# Patient Record
Sex: Male | Born: 1955 | Race: Black or African American | Hispanic: No | Smoking: Former smoker
Health system: Southern US, Community
[De-identification: ages and names within clinical notes are randomized; demographics above are authoritative.]

## PROBLEM LIST (undated history)

## (undated) DIAGNOSIS — H409 Unspecified glaucoma: Secondary | ICD-10-CM

## (undated) DIAGNOSIS — I10 Essential (primary) hypertension: Secondary | ICD-10-CM

## (undated) DIAGNOSIS — Z993 Dependence on wheelchair: Secondary | ICD-10-CM

## (undated) DIAGNOSIS — E119 Type 2 diabetes mellitus without complications: Secondary | ICD-10-CM

## (undated) DIAGNOSIS — R200 Anesthesia of skin: Secondary | ICD-10-CM

## (undated) DIAGNOSIS — Z972 Presence of dental prosthetic device (complete) (partial): Secondary | ICD-10-CM

## (undated) DIAGNOSIS — M1990 Unspecified osteoarthritis, unspecified site: Secondary | ICD-10-CM

## (undated) DIAGNOSIS — M109 Gout, unspecified: Secondary | ICD-10-CM

## (undated) DIAGNOSIS — E7800 Pure hypercholesterolemia, unspecified: Secondary | ICD-10-CM

## (undated) DIAGNOSIS — G629 Polyneuropathy, unspecified: Secondary | ICD-10-CM

## (undated) DIAGNOSIS — I517 Cardiomegaly: Secondary | ICD-10-CM

## (undated) DIAGNOSIS — R011 Cardiac murmur, unspecified: Secondary | ICD-10-CM

## (undated) DIAGNOSIS — I739 Peripheral vascular disease, unspecified: Secondary | ICD-10-CM

## (undated) DIAGNOSIS — H548 Legal blindness, as defined in USA: Secondary | ICD-10-CM

## (undated) DIAGNOSIS — H269 Unspecified cataract: Secondary | ICD-10-CM

## (undated) HISTORY — DX: Unspecified glaucoma: H40.9

## (undated) HISTORY — DX: Anesthesia of skin: R20.0

## (undated) HISTORY — PX: MULTIPLE TOOTH EXTRACTIONS: SHX2053

## (undated) HISTORY — DX: Cardiomegaly: I51.7

## (undated) HISTORY — PX: EYE SURGERY: SHX253

## (undated) HISTORY — PX: WISDOM TOOTH EXTRACTION: SHX21

## (undated) HISTORY — PX: COLONOSCOPY: SHX174

## (undated) HISTORY — DX: Polyneuropathy, unspecified: G62.9

---

## 2012-02-06 DIAGNOSIS — H401190 Primary open-angle glaucoma, unspecified eye, stage unspecified: Secondary | ICD-10-CM | POA: Insufficient documentation

## 2015-12-05 DIAGNOSIS — H1820 Unspecified corneal edema: Secondary | ICD-10-CM | POA: Insufficient documentation

## 2017-09-17 DIAGNOSIS — T868419 Corneal transplant failure, unspecified eye: Secondary | ICD-10-CM | POA: Insufficient documentation

## 2018-08-24 DIAGNOSIS — H2701 Aphakia, right eye: Secondary | ICD-10-CM | POA: Insufficient documentation

## 2019-01-04 ENCOUNTER — Other Ambulatory Visit: Payer: Self-pay | Admitting: *Deleted

## 2019-01-04 DIAGNOSIS — Z20822 Contact with and (suspected) exposure to covid-19: Secondary | ICD-10-CM

## 2019-01-08 LAB — NOVEL CORONAVIRUS, NAA: SARS-CoV-2, NAA: NOT DETECTED

## 2019-05-30 ENCOUNTER — Emergency Department (HOSPITAL_COMMUNITY): Payer: Medicare Other

## 2019-05-30 ENCOUNTER — Encounter: Payer: Self-pay | Admitting: Emergency Medicine

## 2019-05-30 ENCOUNTER — Emergency Department (HOSPITAL_COMMUNITY)
Admission: EM | Admit: 2019-05-30 | Discharge: 2019-05-31 | Discharge status: Against Medical Advice | Payer: Medicare Other | Attending: Emergency Medicine | Admitting: Emergency Medicine

## 2019-05-30 ENCOUNTER — Other Ambulatory Visit: Payer: Self-pay

## 2019-05-30 ENCOUNTER — Ambulatory Visit (INDEPENDENT_AMBULATORY_CARE_PROVIDER_SITE_OTHER)
Admission: EM | Admit: 2019-05-30 | Discharge: 2019-05-30 | Disposition: A | Discharge status: Home / Self Care | Payer: Medicare Other | Source: Home / Self Care

## 2019-05-30 ENCOUNTER — Encounter (HOSPITAL_COMMUNITY): Payer: Self-pay | Admitting: Emergency Medicine

## 2019-05-30 DIAGNOSIS — M7989 Other specified soft tissue disorders: Secondary | ICD-10-CM | POA: Insufficient documentation

## 2019-05-30 DIAGNOSIS — R269 Unspecified abnormalities of gait and mobility: Secondary | ICD-10-CM | POA: Diagnosis not present

## 2019-05-30 DIAGNOSIS — I10 Essential (primary) hypertension: Secondary | ICD-10-CM

## 2019-05-30 DIAGNOSIS — Z5321 Procedure and treatment not carried out due to patient leaving prior to being seen by health care provider: Secondary | ICD-10-CM | POA: Diagnosis not present

## 2019-05-30 DIAGNOSIS — R202 Paresthesia of skin: Secondary | ICD-10-CM | POA: Insufficient documentation

## 2019-05-30 DIAGNOSIS — R200 Anesthesia of skin: Secondary | ICD-10-CM | POA: Diagnosis not present

## 2019-05-30 HISTORY — DX: Type 2 diabetes mellitus without complications: E11.9

## 2019-05-30 HISTORY — DX: Pure hypercholesterolemia, unspecified: E78.00

## 2019-05-30 HISTORY — DX: Essential (primary) hypertension: I10

## 2019-05-30 LAB — URINALYSIS, ROUTINE W REFLEX MICROSCOPIC
Bacteria, UA: NONE SEEN
Bilirubin Urine: NEGATIVE
Glucose, UA: NEGATIVE mg/dL
Hgb urine dipstick: NEGATIVE
Ketones, ur: NEGATIVE mg/dL
Nitrite: NEGATIVE
Protein, ur: NEGATIVE mg/dL
Specific Gravity, Urine: 1.004 — ABNORMAL LOW (ref 1.005–1.030)
pH: 8 (ref 5.0–8.0)

## 2019-05-30 LAB — CBC
HCT: 47.2 % (ref 39.0–52.0)
Hemoglobin: 15.3 g/dL (ref 13.0–17.0)
MCH: 27 pg (ref 26.0–34.0)
MCHC: 32.4 g/dL (ref 30.0–36.0)
MCV: 83.2 fL (ref 80.0–100.0)
Platelets: 178 10*3/uL (ref 150–400)
RBC: 5.67 MIL/uL (ref 4.22–5.81)
RDW: 13.2 % (ref 11.5–15.5)
WBC: 6.4 10*3/uL (ref 4.0–10.5)
nRBC: 0 % (ref 0.0–0.2)

## 2019-05-30 LAB — I-STAT CHEM 8, ED
BUN: 23 mg/dL (ref 8–23)
Calcium, Ion: 1.13 mmol/L — ABNORMAL LOW (ref 1.15–1.40)
Chloride: 101 mmol/L (ref 98–111)
Creatinine, Ser: 0.8 mg/dL (ref 0.61–1.24)
Glucose, Bld: 85 mg/dL (ref 70–99)
HCT: 46 % (ref 39.0–52.0)
Hemoglobin: 15.6 g/dL (ref 13.0–17.0)
Potassium: 3.5 mmol/L (ref 3.5–5.1)
Sodium: 141 mmol/L (ref 135–145)
TCO2: 27 mmol/L (ref 22–32)

## 2019-05-30 LAB — COMPREHENSIVE METABOLIC PANEL
ALT: 18 U/L (ref 0–44)
AST: 21 U/L (ref 15–41)
Albumin: 4.3 g/dL (ref 3.5–5.0)
Alkaline Phosphatase: 63 U/L (ref 38–126)
Anion gap: 15 (ref 5–15)
BUN: 19 mg/dL (ref 8–23)
CO2: 24 mmol/L (ref 22–32)
Calcium: 10 mg/dL (ref 8.9–10.3)
Chloride: 100 mmol/L (ref 98–111)
Creatinine, Ser: 0.77 mg/dL (ref 0.61–1.24)
GFR calc Af Amer: >60 mL/min (ref >60)
GFR calc non Af Amer: >60 mL/min (ref >60)
Glucose, Bld: 85 mg/dL (ref 70–99)
Potassium: 3.5 mmol/L (ref 3.5–5.1)
Sodium: 139 mmol/L (ref 135–145)
Total Bilirubin: 0.7 mg/dL (ref 0.3–1.2)
Total Protein: 8.2 g/dL — ABNORMAL HIGH (ref 6.5–8.1)

## 2019-05-30 LAB — RAPID URINE DRUG SCREEN, HOSP PERFORMED
Amphetamines: NOT DETECTED
Barbiturates: NOT DETECTED
Benzodiazepines: NOT DETECTED
Cocaine: NOT DETECTED
Opiates: NOT DETECTED
Tetrahydrocannabinol: NOT DETECTED

## 2019-05-30 LAB — DIFFERENTIAL
Abs Immature Granulocytes: 0.01 10*3/uL (ref 0.00–0.07)
Basophils Absolute: 0.1 10*3/uL (ref 0.0–0.1)
Basophils Relative: 1 %
Eosinophils Absolute: 0.4 10*3/uL (ref 0.0–0.5)
Eosinophils Relative: 6 %
Immature Granulocytes: 0 %
Lymphocytes Relative: 35 %
Lymphs Abs: 2.3 10*3/uL (ref 0.7–4.0)
Monocytes Absolute: 0.5 10*3/uL (ref 0.1–1.0)
Monocytes Relative: 8 %
Neutro Abs: 3.2 10*3/uL (ref 1.7–7.7)
Neutrophils Relative %: 50 %

## 2019-05-30 LAB — PROTIME-INR
INR: 1.1 (ref 0.8–1.2)
Prothrombin Time: 13.9 seconds (ref 11.4–15.2)

## 2019-05-30 LAB — APTT: aPTT: 30 seconds (ref 24–36)

## 2019-05-30 LAB — CBG MONITORING, ED: Glucose-Capillary: 93 mg/dL (ref 70–99)

## 2019-05-30 LAB — ETHANOL: Alcohol, Ethyl (B): <10 mg/dL (ref <10)

## 2019-05-30 NOTE — ED Triage Notes (Signed)
Patient here with bilateral hand numbness that has been going on for 2 weeks.  He has new numbness in feet that started this afternoon.  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  Unknown if that is new for him.  Patient has equal hand grips.

## 2019-05-30 NOTE — ED Provider Notes (Signed)
MSE was initiated and I personally evaluated the patient and placed orders (if any) at  7:50 PM on May 30, 2019.  The patient appears stable so that the remainder of the MSE may be completed by another provider.  Brian Daniels is a 64 y.o. male here with numbness, weakness. Patient has numbness of bilateral hands for the last 2 weeks. He told triage that he had leg numbness today. But he told me that he has bilateral leg numbness for a week and urgent care noted 2 days of leg numbness and swelling. No trouble speaking. 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 Has dec sensation bilateral arms and legs. Nl strength bilateral upper and lower extremities. Nl finger to nose bilaterally. Consider parethesias from diabetes vs stroke. He is outside window for TPA and doesn't qualify for LVO. Ordered stroke order set and CT head. No code stroke currently     Drenda Freeze, MD 05/30/19 [phone]

## 2019-05-30 NOTE — ED Notes (Signed)
Patient able to ambulate independently  

## 2019-05-30 NOTE — ED Triage Notes (Addendum)
Pt presents to [facility] for assessment of 2 days of bilateral hand numbness.  Sister states 2 days ago she noted bilateral lower limb swelling.  Sister states she came back today at 70 and patient is unable to get up out of the chair on his own, Sister having to lift him and support him when walking into the clinic. aPP at bedside with patient at this time.

## 2019-05-30 NOTE — ED Notes (Signed)
APP explained further evaluation needed in the ER

## 2019-05-30 NOTE — ED Provider Notes (Signed)
[facility]    CSN: [id number] Arrival date & time: 05/30/19  6295      History   Chief Complaint Chief Complaint  Patient presents with  . Foot Swelling    HPI Johncarlos Holtsclaw is a 63 y.o. male with history of hypertension, hyperlipidemia, diabetes presenting with his sister for inability to get up out of his chair, ambulate independently since 17:15 this evening.  Episode was preceded by 2-day course of bilateral hand distal lower extremity numbness, 2-week course of bilateral hand numbness.  Patient denies history of diabetic neuropathy, vascular disease.  Sister helps provide history: Denies recent change in medications, fall, head trauma or loss of consciousness.  Sister largely concerned that patient has not been able to get up since this evening.  No facial droop, slurred speech, increased irritation/aggravation.   Past Medical History:  Diagnosis Date  . Diabetes mellitus without complication ([place])   . Hypercholesteremia   . Hypertension     There are no active problems to display for this patient.   No past surgical history on file.     Home Medications    Prior to Admission medications   Not on File    Family History No family history on file.  Social History Social History   Tobacco Use  . Smoking status: Not on file  Substance Use Topics  . Alcohol use: Not on file  . Drug use: Not on file     Allergies   Patient has no known allergies.   Review of Systems Review of Systems  Constitutional: Negative for fatigue and fever.  Respiratory: Negative for cough and shortness of breath.   Cardiovascular: Negative for chest pain and palpitations.  Gastrointestinal: Negative for abdominal pain, diarrhea and vomiting.  Musculoskeletal: Positive for gait problem. Negative for arthralgias and myalgias.  Skin: Negative for rash and wound.  Neurological: Positive for weakness and numbness. Negative for dizziness, facial asymmetry, speech  difficulty, light-headedness and headaches.  All other systems reviewed and are negative.    Physical Exam Triage Vital Signs ED Triage Vitals  Enc Vitals Group     BP 05/30/19 1837 (!) 186/101     Pulse Rate 05/30/19 1837 63     Resp 05/30/19 1837 18     Temp 05/30/19 1837 98.2 F (36.8 C)     Temp src --      SpO2 05/30/19 1837 97 %     Weight --      Height --      Head Circumference --      Peak Flow --      Pain Score 05/30/19 1838 0     Pain Loc --      Pain Edu? --      Excl. in [place]? --    No data found.  Updated Vital Signs BP (!) 173/90 (BP Location: Right Arm)   Pulse 63   Temp 98.2 F (36.8 C)   Resp 18   SpO2 97%   Visual Acuity Right Eye Distance:   Left Eye Distance:   Bilateral Distance:    Right Eye Near:   Left Eye Near:    Bilateral Near:     Physical Exam Constitutional:      General: He is not in acute distress.    Appearance: He is normal weight. He is not ill-appearing.  HENT:     Head: Normocephalic and atraumatic.     Mouth/Throat:     Pharynx: Oropharynx is clear.  Eyes:     General: No scleral icterus.    Conjunctiva/sclera: Conjunctivae normal.     Comments: Right corneal implant with slight lid droop that is chronic/stable per sister  Neck:     Musculoskeletal: Neck supple. No muscular tenderness.  Cardiovascular:     Rate and Rhythm: Normal rate and regular rhythm.  Pulmonary:     Effort: Pulmonary effort is normal. No respiratory distress.     Breath sounds: No wheezing.  Lymphadenopathy:     Cervical: No cervical adenopathy.  Skin:    Capillary Refill: Capillary refill takes less than 2 seconds.     Coloration: Skin is not jaundiced or pale.  Neurological:     Mental Status: He is alert and oriented to person, place, and time.     Cranial Nerves: No cranial nerve deficit.     Sensory: Sensory deficit present.     Motor: Weakness present.     Coordination: Coordination normal.     Gait: Gait abnormal.     Deep  Tendon Reflexes: Reflexes normal.     Comments: Exam limited second to patient's compliance due to reported inability to get out of office wheelchair      UC Treatments / Results  Labs (all labs ordered are listed, but only abnormal results are displayed) Labs Reviewed - No data to display  EKG   Radiology No results found.  Procedures Procedures (including critical care time)  Medications Ordered in UC Medications - No data to display  Initial Impression / Assessment and Plan / UC Course  I have reviewed the triage vital signs and the nursing notes.  Pertinent labs & imaging results that were available during my care of the patient were reviewed by me and considered in my medical decision making (see chart for details).     Given patient's age, risk factors, and sudden change in ability to stand up, ambulate independently x1 hour, patient's sister feels more comfortable with patient going to ER for further evaluation.  Bilateral upper and lower distal extremity symptoms likely vascular or diabetic neuropathy related, though inability to ambulate is more concerning, no focal neuro findings suggestive of acute stroke.  Patient hemodynamically stable, in no acute distress, sister friend to transport as opposed to EMS.  Return precautions discussed, patient verbalized understanding and is agreeable to plan. Final Clinical Impressions(s) / UC Diagnoses   Final diagnoses:  Abnormal gait  Numbness of foot   Discharge Instructions   None    ED Prescriptions    None     PDMP not reviewed this encounter.   Odette Fraction [place], [place] 06/02/19 [phone]

## 2019-06-08 ENCOUNTER — Encounter: Payer: Self-pay | Admitting: *Deleted

## 2019-06-13 ENCOUNTER — Encounter: Payer: Self-pay | Admitting: Neurology

## 2019-06-13 ENCOUNTER — Other Ambulatory Visit: Payer: Self-pay

## 2019-06-13 ENCOUNTER — Ambulatory Visit: Payer: Medicare Other | Admitting: Neurology

## 2019-06-13 VITALS — BP 162/94 | HR 72 | Temp 97.8°F

## 2019-06-13 DIAGNOSIS — R531 Weakness: Secondary | ICD-10-CM

## 2019-06-13 DIAGNOSIS — M545 Low back pain, unspecified: Secondary | ICD-10-CM | POA: Insufficient documentation

## 2019-06-13 DIAGNOSIS — R269 Unspecified abnormalities of gait and mobility: Secondary | ICD-10-CM | POA: Diagnosis not present

## 2019-06-13 DIAGNOSIS — R202 Paresthesia of skin: Secondary | ICD-10-CM | POA: Diagnosis not present

## 2019-06-13 DIAGNOSIS — R3915 Urgency of urination: Secondary | ICD-10-CM

## 2019-06-13 DIAGNOSIS — G3281 Cerebellar ataxia in diseases classified elsewhere: Secondary | ICD-10-CM

## 2019-06-13 NOTE — Progress Notes (Signed)
PATIENT: Brian Daniels DOB: [birth year]  Chief Complaint  Patient presents with  . Numbness    He is here with his sister, [name].  Reports numbness in his hands, legs and feet.  Symptoms have worsened over the last three months.  Over the last three weeks, he has been using a wheelchair due to not being able to stand without assistance.  He was recently started on gabapentin 300mg , one capsule TID.  PCP    [name] Kitchen, MD     HISTORICAL  Seydina Holliman is a 63 year old male, seen in request by his primary care physician Dr. 77 for evaluation of gait abnormality, numbness, he is accompanied by his sister Knox Royalty at today's visit on June 13, 2019.  I have reviewed and summarized the referring note from the referring physician.  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  In August 2020, he began to noticed left hand numbness, initially only involving fingertips, over couple weeks travel to left elbow level, then developed right hand numbness, began to notice gait abnormality since November 2020, also has worsening constipation, urinary urgency, occasionally urinary accident,  He also complains of neck pain, radiating pain to bilateral shoulder, low back pain, left arm, and leg is weaker than the right side  He presented to emergency room on May 30, 2019, I personally reviewed CT scans,  CT head showed prominent of ventricle out of proportion to the sulcal dilatation, suggestive of normal pressure hydrocephalus  Cervical spine showed multilevel degenerative changes, anterior wedging of the C5-6-7, at least moderate canal stenosis at C4-5, C5-6, C6-7.  Laboratory evaluation showed normal CMP with exception of slightly decreased iron calcium 1.13, normal CBC, with hemoglobin of  15.3, INR 1.1, negative urine drug screen,   REVIEW OF SYSTEMS: Full 14 system review of systems performed and notable only for as above All other review of systems were negative.  ALLERGIES: No Known Allergies  HOME MEDICATIONS: Current Outpatient Medications  Medication Sig Dispense Refill  . amLODipine (NORVASC) 5 MG tablet Take 5 mg by mouth daily.    . Ascorbic Acid (VITAMIN C PO) Take 1 tablet by mouth daily.    June 01, 2019 aspirin EC 81 MG tablet Take 1 tablet by mouth daily.    [name] Kitchen atorvastatin (LIPITOR) 20 MG tablet Take 20 mg by mouth daily.    . benazepril (LOTENSIN) 10 MG tablet Take 10 mg by mouth daily.    . BL CALCIUM-MAGNESIUM-ZINC PO Take 1 tablet by mouth daily.    . brimonidine (ALPHAGAN) 0.2 % ophthalmic solution Place 1 drop into both eyes 3 (three) times daily.    . Cyanocobalamin (B-12 PO) Take 1 tablet by mouth daily.    . ferrous sulfate 324 MG TBEC Take 324 mg by mouth 2 (two) times daily.    [name] Kitchen gabapentin (NEURONTIN) 300 MG capsule Take 300 mg by mouth 3 (three) times daily.    [name] Kitchen GINSENG PO Take 1 tablet by mouth daily.    . indomethacin (INDOCIN) 50 MG capsule Take 50 mg by mouth 3 (three) times daily as needed.    . metFORMIN (GLUCOPHAGE-XR) 500 MG 24 hr tablet Take 1 tablet by mouth daily.    . metoprolol succinate (TOPROL-XL) 25 MG 24 hr tablet Take 25 mg by mouth daily.    [name] Kitchen  Omega-3 Fatty Acids (FISH OIL PO) Take 1 tablet by mouth daily.    . prednisoLONE acetate (PRED FORTE) 1 % ophthalmic suspension Place 1 drop into the right eye 2 (two) times daily.    . timolol (BETIMOL) 0.5 % ophthalmic solution Place 1 drop into both eyes 2 (two) times daily.    [name] Kitchen VITAMIN D PO Take 1 tablet by mouth daily.    [name] Kitchen VITAMIN E PO Take 1 tablet by mouth daily.     No current facility-administered medications for this visit.     PAST MEDICAL HISTORY: Past Medical History:  Diagnosis Date  . Diabetes mellitus without complication ([place])   . Glaucoma   . Hypercholesteremia   .  Hypertension   . Left atrial enlargement   . Neuropathy   . Numbness     PAST SURGICAL HISTORY: Past Surgical History:  Procedure Laterality Date  . EYE SURGERY Bilateral     FAMILY HISTORY: Family History  Problem Relation Age of Onset  . Hyperlipidemia Mother   . Diabetes Mother   . Hypertension Father     SOCIAL HISTORY: Social History   Socioeconomic History  . Marital status: Single    Spouse name: Not on file  . Number of children: 0  . Years of education: 49  . Highest education level: High school graduate  Occupational History  . Occupation: Retired  Scientific laboratory technician  . Financial resource strain: Not on file  . Food insecurity    Worry: Not on file    Inability: Not on file  . Transportation needs    Medical: Not on file    Non-medical: Not on file  Tobacco Use  . Smoking status: Never Smoker  . Smokeless tobacco: Never Used  Substance and Sexual Activity  . Alcohol use: Not Currently  . Drug use: Never  . Sexual activity: Not on file  Lifestyle  . Physical activity    Days per week: Not on file    Minutes per session: Not on file  . Stress: Not on file  Relationships  . Social Herbalist on phone: Not on file    Gets together: Not on file    Attends religious service: Not on file    Active member of club or organization: Not on file    Attends meetings of clubs or organizations: Not on file    Relationship status: Not on file  . Intimate partner violence    Fear of current or ex partner: Not on file    Emotionally abused: Not on file    Physically abused: Not on file    Forced sexual activity: Not on file  Other Topics Concern  . Not on file  Social History Narrative   Lives with his sister.   Left-handed.   No daily caffeine use.  Occasional tea.     PHYSICAL EXAM   Vitals:   06/13/19 1359  BP: (!) 162/94  Pulse: 72  Temp: 97.8 F (36.6 C)    Not recorded      There is no height or weight on file to calculate BMI.   PHYSICAL EXAMNIATION:  Gen: NAD, conversant, well nourised, well groomed                     Cardiovascular: Regular rate rhythm, no peripheral edema, warm, nontender. Eyes: Conjunctivae clear without exudates or hemorrhage Neck: Supple, no carotid bruits. Pulmonary: Clear to auscultation bilaterally   NEUROLOGICAL EXAM:  MENTAL STATUS: Speech:    Speech is normal; fluent and spontaneous with normal comprehension.  Cognition:     Orientation to time, place and person     Normal recent and remote memory     Normal Attention span and concentration     Normal Language, naming, repeating,spontaneous speech     Fund of knowledge   CRANIAL NERVES: CN II: Visual fields are full to confrontation.  Pupils are round equal and briskly reactive to light. CN III, IV, VI: extraocular movement are normal. No ptosis. CN V: Facial sensation is intact to pinprick in all 3 divisions bilaterally. Corneal responses are intact.  CN VII: Face is symmetric with normal eye closure and smile. CN VIII: Hearing is normal to causal conversation. CN IX, X: Palate elevates symmetrically. Phonation is normal. CN XI: Head turning and shoulder shrug are intact CN XII: Tongue is midline with normal movements and no atrophy.  MOTOR: Normal tone of bilateral upper extremity, mild to moderate bilateral shoulder abduction, elbow flexion weakness, mild bilateral elbow extension weakness, mild bilateral wrist flexion extension weakness, grip weakness, left worse than right, fixation of left upper extremity on rapid rotating movement  Motor strength of bilateral lower extremity (R/L), hip flexion 4/2 knee extension 5/4, knee flexion 5/3, ankle dorsiflexion 4/3, ankle plantarflexion 4/3  REFLEXES: Reflexes are 2+ and symmetric at the biceps, triceps, knees, and absent at ankles. Plantar responses are extensor bilaterally  SENSORY: Decreased vibratory sensation at fingertips, and toes, length dependent decreased to  pinprick to elbow level, and knee level  COORDINATION: There is no trunk or limb dysmetria noted.  GAIT/STANCE: Deferred   DIAGNOSTIC DATA (LABS, IMAGING, TESTING) - I reviewed patient records, labs, notes, testing and imaging myself where available.   ASSESSMENT AND PLAN  Francesco RunnerGlenn Ohnemus is a 63 y.o. male   Subacute onset progressive weakness, involving bilateral upper and lower extremity, gait abnormality  Length dependent sensory changes, bilateral upper and lower extremity weakness, left worse than right, hyperreflexia, bilateral Babinski signs  CT head showed enlarged ventricle, suggestive of normal hydrocephalus, cervical spine showed significant degenerative disease, moderate canal stenosis at C4-5, 5 6, C6-7 level  Proceed with MRI of cervical, brain, also need to rule out lumbar stenosis, MRI of lumbar spine   Levert FeinsteinYijun Keymon Mcelroy, M.D. Ph.D.  [facility] [address], [address] [place], [place] [address] Ph: [phone]) [phone] Fax: [phone]  CC: Knox RoyaltyJones, Enrico, MD

## 2019-06-15 ENCOUNTER — Telehealth: Payer: Self-pay | Admitting: Neurology

## 2019-06-15 DIAGNOSIS — R269 Unspecified abnormalities of gait and mobility: Secondary | ICD-10-CM

## 2019-06-15 DIAGNOSIS — R531 Weakness: Secondary | ICD-10-CM

## 2019-06-15 DIAGNOSIS — M545 Low back pain, unspecified: Secondary | ICD-10-CM

## 2019-06-15 DIAGNOSIS — R202 Paresthesia of skin: Secondary | ICD-10-CM

## 2019-06-15 NOTE — Telephone Encounter (Signed)
Per Dr. Krista Blue, okay to provide rx for wheelchair.  Signed and placed up front for his sister to pick up.  Also, she would like his MRI scans to be completed urgently - by the end of this week.

## 2019-06-15 NOTE — Telephone Encounter (Signed)
Patient sister called to request a wheelchair prescription/order due to the patient not being able to walk. Sister states the wheelchair the patient was in at the time of appt was borrowed however in order for insurance coverage a prescription would be needed.   Patient sister also had a couple of questions in regards to the MRI that is needing to be scheduled and states the patient is getting worst by the day and is unsure if he should go to the ED.  Please follow up.

## 2019-06-16 ENCOUNTER — Ambulatory Visit: Payer: Medicare Other | Admitting: Neurology

## 2019-06-16 NOTE — Telephone Encounter (Signed)
Their first available is next Wednesday 12/09. I will make them aware they need to do the Cervical that day.

## 2019-06-16 NOTE — Telephone Encounter (Signed)
Do MRI cervical urgently, MRI of brain and Lumbar can be done later.

## 2019-06-16 NOTE — Telephone Encounter (Signed)
I called GI they have changed the CERVICAL to the first available day of 12/09 and will complete the other two images on 12/14. DW

## 2019-06-21 NOTE — Telephone Encounter (Signed)
error 

## 2019-06-23 ENCOUNTER — Telehealth: Payer: Self-pay | Admitting: Neurology

## 2019-06-23 ENCOUNTER — Ambulatory Visit
Admission: RE | Admit: 2019-06-23 | Discharge: 2019-06-23 | Disposition: A | Discharge status: Home / Self Care | Payer: Medicare Other | Source: Ambulatory Visit | Attending: Neurology | Admitting: Neurology

## 2019-06-23 ENCOUNTER — Other Ambulatory Visit: Payer: Self-pay

## 2019-06-23 ENCOUNTER — Other Ambulatory Visit: Payer: Medicare Other

## 2019-06-23 DIAGNOSIS — G3281 Cerebellar ataxia in diseases classified elsewhere: Secondary | ICD-10-CM

## 2019-06-23 NOTE — Telephone Encounter (Signed)
I have done peer to peer review.  MRI brain MRI cervical MRI lumbar  [phone],

## 2019-06-23 NOTE — Telephone Encounter (Signed)
Noted, thank you. DW  °

## 2019-06-23 NOTE — Telephone Encounter (Signed)
Dr. Krista Blue Please call for peer to peer today . Patient is scheduled at 5:00 pm today at [facility] .  Please call [phone] option [phone]  Insurance ID # [id number].  MRI Lumber spine  wo  - [id number]  MR Cervical Spine wo -[id number]  MR Brain wo contrast [phone]

## 2019-06-27 ENCOUNTER — Other Ambulatory Visit: Payer: Self-pay

## 2019-06-27 ENCOUNTER — Ambulatory Visit
Admission: RE | Admit: 2019-06-27 | Discharge: 2019-06-27 | Disposition: A | Discharge status: Home / Self Care | Payer: Medicare Other | Source: Ambulatory Visit | Attending: Neurology | Admitting: Neurology

## 2019-06-27 ENCOUNTER — Other Ambulatory Visit: Payer: Medicare Other

## 2019-06-27 ENCOUNTER — Telehealth: Payer: Self-pay | Admitting: Neurology

## 2019-06-27 DIAGNOSIS — G959 Disease of spinal cord, unspecified: Secondary | ICD-10-CM

## 2019-06-27 DIAGNOSIS — G3281 Cerebellar ataxia in diseases classified elsewhere: Secondary | ICD-10-CM

## 2019-06-27 DIAGNOSIS — R269 Unspecified abnormalities of gait and mobility: Secondary | ICD-10-CM

## 2019-06-27 NOTE — Telephone Encounter (Signed)
Please call patient, MRI of cervical spine showed severe spinal stenosis at C3-4, with evidence of spinal cord signal changes.  Multilevel degenerative changes  Let him keep his scheduled MRI, and a follow-up appointment, also referred him to neurosurgical clinic urgently.   IMPRESSION: This is an abnormal MRI of the cervical spine showing the following: 1.    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   2.    There is also mild spinal stenosis at C4-C5, moderate spinal stenosis at C5-6, moderate spinal stenosis at C6-7 and mild spinal stenosis at C7-T1 due to combination of degenerative changes. 3.    Various degrees of foraminal stenosis at every level.  It could be nerve root compression at C2-C3 affecting the left C3 nerve root, at C3-C4 affecting the right C4 nerve root, C4-C5 affecting the right C5 nerve root and C7-T1 affecting either C8 nerve root.

## 2019-06-27 NOTE — Telephone Encounter (Signed)
I spoke to the patient and his sister.  They verbalized understanding of his cervical MRI results.  She has already talked with the neurosurgeon office and he has an appt on 07/04/2019.  He is scheduled to have the MRI scans of his brain and lumbar today.  He will keep his follow up with Dr. Krista Blue on 06/30/2019.

## 2019-06-27 NOTE — Telephone Encounter (Signed)
Referral sent 

## 2019-06-30 ENCOUNTER — Other Ambulatory Visit: Payer: Self-pay

## 2019-06-30 ENCOUNTER — Ambulatory Visit: Payer: Medicare Other | Admitting: Neurology

## 2019-06-30 ENCOUNTER — Encounter: Payer: Self-pay | Admitting: Neurology

## 2019-06-30 VITALS — BP 161/91 | HR 88 | Temp 97.7°F

## 2019-06-30 DIAGNOSIS — G959 Disease of spinal cord, unspecified: Secondary | ICD-10-CM | POA: Diagnosis not present

## 2019-06-30 DIAGNOSIS — R202 Paresthesia of skin: Secondary | ICD-10-CM

## 2019-06-30 DIAGNOSIS — R269 Unspecified abnormalities of gait and mobility: Secondary | ICD-10-CM

## 2019-06-30 DIAGNOSIS — R531 Weakness: Secondary | ICD-10-CM | POA: Diagnosis not present

## 2019-06-30 NOTE — Progress Notes (Addendum)
PATIENT: Brian Daniels Jurczyk DOB: 1956-01-18  Chief Complaint  Patient presents with  . Gait Abnormality    He is here with his sister, [name].  They would like to review the results of his brain, cervical and lumbar MRI scans.  He has a pending appt at [facility] on 07/04/2019.     HISTORICAL  Brian Daniels Junkins is a 63 year old male, seen in request by his primary care physician Dr. Knox RoyaltyJones, [place] for evaluation of gait abnormality, numbness, he is accompanied by his sister Jake Samplesthena at today's visit on June 13, 2019.  I have reviewed and summarized the referring note from the referring physician.  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  In August 2020, he began to noticed left hand numbness, initially only involving fingertips, over couple weeks travel to left elbow level, then developed right hand numbness, began to notice gait abnormality since November 2020, also has worsening constipation, urinary urgency, occasionally urinary accident,  He also complains of neck pain, radiating pain to bilateral shoulder, low back pain, left arm, and leg is weaker than the right side  He presented to emergency room on May 30, 2019, I personally reviewed CT scans,  CT head showed prominent of ventricle out of proportion to the sulcal dilatation, suggestive of normal pressure hydrocephalus  Cervical spine showed multilevel degenerative changes, anterior wedging of the C5-6-7, at least moderate canal stenosis at C4-5, C5-6, C6-7.  Laboratory evaluation showed normal CMP with exception of slightly decreased iron calcium 1.13, normal CBC, with hemoglobin of 15.3, INR 1.1, negative urine drug screen,  UPDATE Jun 30 2019: He is accompanied by his sister at today's visit, complains of progressive gait  abnormality, upper extremity weakness, left worse than right  We have personally reviewed MRIs in December 2020  MRI of lumbar, multilevel degenerative changes, most severe at 3&4,\spelled with moderate to severe spinal stenosis, severe right, moderate left foraminal narrowing  MRI of brain, moderate perisylvian atrophy, moderate ventriculomegaly  MRI of cervical spine, abnormal signal within the spinal cord adjacent to C3-4, consistent with compressive myelopathy, associated with severe spinal stenosis at this level, multilevel degenerative changes, variable degree of foraminal narrowing  REVIEW OF SYSTEMS: Full 14 system review of systems performed and notable only for as above All other review of systems were negative.  ALLERGIES: No Known Allergies  HOME MEDICATIONS: Current Outpatient Medications  Medication Sig Dispense Refill  . amLODipine (NORVASC) 5 MG tablet Take 5 mg by mouth daily.    . Ascorbic Acid (VITAMIN C PO) Take 1 tablet by mouth daily.    [name] Kitchen. aspirin EC 81 MG tablet Take 1 tablet by mouth daily.    [name] Kitchen. atorvastatin (LIPITOR) 20 MG tablet Take 20 mg by mouth daily.    . benazepril (LOTENSIN) 10 MG tablet Take 10 mg by mouth daily.    . BL CALCIUM-MAGNESIUM-ZINC PO Take 1 tablet by mouth daily.    . brimonidine (ALPHAGAN) 0.2 % ophthalmic solution Place 1 drop into both eyes 3 (three) times daily.    . Cyanocobalamin (B-12 PO) Take 1 tablet by mouth daily.    . ferrous sulfate 324 MG TBEC Take 324 mg by mouth 2 (two) times daily.    [name] Kitchen. gabapentin (NEURONTIN) 300 MG capsule Take 300 mg by mouth 3 (three) times daily.    .[name] Kitchen  GINSENG PO Take 1 tablet by mouth daily.    . indomethacin (INDOCIN) 50 MG capsule Take 50 mg by mouth 3 (three) times daily as needed.    . metFORMIN (GLUCOPHAGE-XR) 500 MG 24 hr tablet Take 1 tablet by mouth daily.    . metoprolol succinate (TOPROL-XL) 25 MG 24 hr tablet Take 25 mg by mouth daily.    . Omega-3 Fatty Acids (FISH OIL PO) Take 1 tablet  by mouth daily.    . prednisoLONE acetate (PRED FORTE) 1 % ophthalmic suspension Place 1 drop into the right eye 2 (two) times daily.    . timolol (BETIMOL) 0.5 % ophthalmic solution Place 1 drop into both eyes 2 (two) times daily.    [name] Kitchen VITAMIN D PO Take 1 tablet by mouth daily.    [name] Kitchen VITAMIN E PO Take 1 tablet by mouth daily.     No current facility-administered medications for this visit.    PAST MEDICAL HISTORY: Past Medical History:  Diagnosis Date  . Diabetes mellitus without complication (HCC)   . Glaucoma   . Hypercholesteremia   . Hypertension   . Left atrial enlargement   . Neuropathy   . Numbness     PAST SURGICAL HISTORY: Past Surgical History:  Procedure Laterality Date  . EYE SURGERY Bilateral     FAMILY HISTORY: Family History  Problem Relation Age of Onset  . Hyperlipidemia Mother   . Diabetes Mother   . Hypertension Father     SOCIAL HISTORY: Social History   Socioeconomic History  . Marital status: Single    Spouse name: Not on file  . Number of children: 0  . Years of education: 87  . Highest education level: High school graduate  Occupational History  . Occupation: Retired  Tobacco Use  . Smoking status: Never Smoker  . Smokeless tobacco: Never Used  Substance and Sexual Activity  . Alcohol use: Not Currently  . Drug use: Never  . Sexual activity: Not on file  Other Topics Concern  . Not on file  Social History Narrative   Lives with his sister.   Left-handed.   No daily caffeine use.  Occasional tea.   Social Determinants of Health   Financial Resource Strain:   . Difficulty of Paying Living Expenses: Not on file  Food Insecurity:   . Worried About Programme researcher, broadcasting/film/video in the Last Year: Not on file  . Ran Out of Food in the Last Year: Not on file  Transportation Needs:   . Lack of Transportation (Medical): Not on file  . Lack of Transportation (Non-Medical): Not on file  Physical Activity:   . Days of Exercise per Week: Not on  file  . Minutes of Exercise per Session: Not on file  Stress:   . Feeling of Stress : Not on file  Social Connections:   . Frequency of Communication with Friends and Family: Not on file  . Frequency of Social Gatherings with Friends and Family: Not on file  . Attends Religious Services: Not on file  . Active Member of Clubs or Organizations: Not on file  . Attends Banker Meetings: Not on file  . Marital Status: Not on file  Intimate Partner Violence:   . Fear of Current or Ex-Partner: Not on file  . Emotionally Abused: Not on file  . Physically Abused: Not on file  . Sexually Abused: Not on file     PHYSICAL EXAM   Vitals:   06/30/19 1319  BP: (!) 161/91  Pulse: 88  Temp: 97.7 F (36.5 C)    Not recorded      There is no height or weight on file to calculate BMI.  PHYSICAL EXAMNIATION:  Gen: NAD, conversant, well nourised, well groomed                     Cardiovascular: Regular rate rhythm, no peripheral edema, warm, nontender. Eyes: Conjunctivae clear without exudates or hemorrhage Neck: Supple, no carotid bruits. Pulmonary: Clear to auscultation bilaterally   NEUROLOGICAL EXAM:  MENTAL STATUS: Speech:    Speech is normal; fluent and spontaneous with normal comprehension.  Cognition:     Orientation to time, place and person     Normal recent and remote memory     Normal Attention span and concentration     Normal Language, naming, repeating,spontaneous speech     Fund of knowledge   CRANIAL NERVES: CN II: Finger counting only, pupils are irregular reactive to light CN III, IV, VI: extraocular movement are normal. No ptosis. CN V: Facial sensation is intact to pinprick in all 3 divisions bilaterally. Corneal responses are intact.  CN VII: Face is symmetric with normal eye closure and smile. CN VIII: Hearing is normal to causal conversation. CN IX, X: Palate elevates symmetrically. Phonation is normal. CN XI: Head turning and shoulder shrug  are intact CN XII: Tongue is midline with normal movements and no atrophy.  MOTOR: Normal tone of bilateral upper extremity,  Motor strength (R/L): Shoulder abduction 4/3, external rotation4/3, elbow flexion4/3, elbow extension4+/4, wrist flexion4/3, wrist extension4/3, grip4/3  Hip flexion4/2, knee flexion4/3, knee extension4+/1, ankle dorsiflexion4/3, ankle plantarflexion 4+/4   REFLEXES: Hyperreflexia of bilateral upper and lower extremity  SENSORY: Decreased vibratory sensation at fingertips, and toes, length dependent decreased to pinprick to elbow level, and knee level  COORDINATION: There is no trunk or limb dysmetria noted.  GAIT/STANCE: Deferred   DIAGNOSTIC DATA (LABS, IMAGING, TESTING) - I reviewed patient records, labs, notes, testing and imaging myself where available.   ASSESSMENT AND PLAN  Loreto Loescher is a 63 y.o. male   Subacute onset progressive weakness, involving bilateral upper and lower extremity, gait abnormality  MRI of cervical spine showed severe spinal stenosis at C3-4, with evidence of cord signal change, myelopathy,  I have referred him to neurosurgeon for evaluations, potential decompression surgery Lumbar stenosis at L3-4 Generalized brain atrophy, ventriculomegaly   Marcial Pacas, M.D. Ph.D.  [facility] [address], [place],  [address] Ph: [phone] Fax: [phone]  CC: Kristie Cowman, MD  Addendum: I reviewed feedback from neurosurgeon Dr. Kathyrn Sheriff.  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  Office visit with Dr. Kathyrn Sheriff on September 05, 2019, status post C3-4, C4-5, C5-6, C6-7 ACDF, improvement in his preoperative myelopathy symptoms, including significant improvement in strength,  he has regained ability to walk with a rolling walker.

## 2019-07-04 DIAGNOSIS — M542 Cervicalgia: Secondary | ICD-10-CM | POA: Insufficient documentation

## 2019-07-05 ENCOUNTER — Other Ambulatory Visit: Payer: Self-pay | Admitting: Neurosurgery

## 2019-07-26 ENCOUNTER — Other Ambulatory Visit: Payer: Self-pay | Admitting: Neurosurgery

## 2019-08-02 NOTE — Progress Notes (Signed)
[facility] DR[facility] STORE [address], [address] [place] [address] Phone: [phone] Fax: [phone]       Your procedure is scheduled on Friday, January 22nd.  Report to [facility] Main Entrance "A" at 5:30 A.M., and check in at the Admitting office.  Call this number if you have problems the morning of surgery:  [phone]  Call [phone] if you have any questions prior to your surgery date Monday-Friday 8am-4pm    Remember:  Do not eat or drink after midnight the night before your surgery     Take these medicines the morning of surgery with A SIP OF WATER   Amlodipine (Norvasc)  Atorvastatin (Lipitor)  Eye drops - if needed  Metoprolol    7 days prior to surgery STOP taking any Aspirin (unless otherwise instructed by your surgeon), Aleve, Naproxen, Ibuprofen, Motrin, Advil, Goody's, BC's, all herbal medications, fish oil, and all vitamins.   WHAT DO I DO ABOUT MY DIABETES MEDICATION?   [name] Kitchen Do not take oral diabetes medicines (pills) the morning of surgery. - Metformin   HOW TO MANAGE YOUR DIABETES BEFORE AND AFTER SURGERY  Why is it important to control my blood sugar before and after surgery? . Improving blood sugar levels before and after surgery helps healing and can limit problems. . A way of improving blood sugar control is eating a healthy diet by: o  Eating less sugar and carbohydrates o  Increasing activity/exercise o  Talking with your doctor about reaching your blood sugar goals . High blood sugars (greater than 180 mg/dL) can raise your risk of infections and slow your recovery, so you will need to focus on controlling your diabetes during the weeks before surgery. . Make sure that the doctor who takes care of your diabetes knows about your planned surgery including the date and location.  How do I manage my blood sugar before surgery? . Check your blood sugar at  least 4 times a day, starting 2 days before surgery, to make sure that the level is not too high or low. . Check your blood sugar the morning of your surgery when you wake up and every 2 hours until you get to the Short Stay unit. o If your blood sugar is less than 70 mg/dL, you will need to treat for low blood sugar: - Do not take insulin. - Treat a low blood sugar (less than 70 mg/dL) with  cup of clear juice (cranberry or apple), 4 glucose tablets, OR glucose gel. - Recheck blood sugar in 15 minutes after treatment (to make sure it is greater than 70 mg/dL). If your blood sugar is not greater than 70 mg/dL on recheck, call [phone] for further instructions. . Report your blood sugar to the short stay nurse when you get to Short Stay.  . If you are admitted to the hospital after surgery: o Your blood sugar will be checked by the staff and you will probably be given insulin after surgery (instead of oral diabetes medicines) to make sure you have good blood sugar levels. o The goal for blood sugar control after surgery is 80-180 mg/dL.    The Morning of Surgery  Do not wear jewelry.  Do not wear lotions, powders, colognes, or deodorant  Men may shave face and neck.  Do not bring valuables to the hospital.  [facility] is not responsible for any belongings  or valuables.  If you are a smoker, DO NOT Smoke 24 hours prior to surgery  If you wear a CPAP at night please bring your mask the morning of surgery   Remember that you must have someone to transport you home after your surgery, and remain with you for 24 hours if you are discharged the same day.   Please bring cases for contacts, glasses, hearing aids, dentures or bridgework because it cannot be worn into surgery.    Leave your suitcase in the car.  After surgery it may be brought to your room.  For patients admitted to the hospital, discharge time will be determined by your treatment team.  Patients discharged the day of  surgery will not be allowed to drive home.    Special instructions:   [facility]- Preparing For Surgery  Before surgery, you can play an important role. Because skin is not sterile, your skin needs to be as free of germs as possible. You can reduce the number of germs on your skin by washing with CHG (chlorahexidine gluconate) Soap before surgery.  CHG is an antiseptic cleaner which kills germs and bonds with the skin to continue killing germs even after washing.    Oral Hygiene is also important to reduce your risk of infection.  Remember - BRUSH YOUR TEETH THE MORNING OF SURGERY WITH YOUR REGULAR TOOTHPASTE  Please do not use if you have an allergy to CHG or antibacterial soaps. If your skin becomes reddened/irritated stop using the CHG.  Do not shave (including legs and underarms) for at least 48 hours prior to first CHG shower. It is OK to shave your face.  Please follow these instructions carefully.   1. Shower the NIGHT BEFORE SURGERY and the MORNING OF SURGERY with CHG Soap.   2. If you chose to wash your hair, wash your hair first as usual with your normal shampoo.  3. After you shampoo, rinse your hair and body thoroughly to remove the shampoo.  4. Use CHG as you would any other liquid soap. You can apply CHG directly to the skin and wash gently with a scrungie or a clean washcloth.   5. Apply the CHG Soap to your body ONLY FROM THE NECK DOWN.  Do not use on open wounds or open sores. Avoid contact with your eyes, ears, mouth and genitals (private parts). Wash Face and genitals (private parts)  with your normal soap.   6. Wash thoroughly, paying special attention to the area where your surgery will be performed.  7. Thoroughly rinse your body with warm water from the neck down.  8. DO NOT shower/wash with your normal soap after using and rinsing off the CHG Soap.  9. Pat yourself dry with a CLEAN TOWEL.  10. Wear CLEAN PAJAMAS to bed the night before surgery, wear  comfortable clothes the morning of surgery  11. Place CLEAN SHEETS on your bed the night of your first shower and DO NOT SLEEP WITH PETS.    Day of Surgery:  Please shower the morning of surgery with the CHG soap Do not apply any deodorants/lotions. Please wear clean clothes to the hospital/surgery center.   Remember to brush your teeth WITH YOUR REGULAR TOOTHPASTE.   Please read over the following fact sheets that you were given.

## 2019-08-03 ENCOUNTER — Other Ambulatory Visit: Payer: Self-pay

## 2019-08-03 ENCOUNTER — Encounter (HOSPITAL_COMMUNITY): Payer: Self-pay

## 2019-08-03 ENCOUNTER — Ambulatory Visit (HOSPITAL_COMMUNITY)
Admission: RE | Admit: 2019-08-03 | Discharge: 2019-08-03 | Disposition: A | Discharge status: Home / Self Care | Payer: Medicare Other | Source: Ambulatory Visit | Attending: Neurosurgery | Admitting: Neurosurgery

## 2019-08-03 ENCOUNTER — Other Ambulatory Visit (HOSPITAL_COMMUNITY)
Admission: RE | Admit: 2019-08-03 | Discharge: 2019-08-03 | Disposition: A | Discharge status: Home / Self Care | Payer: Medicare Other | Source: Ambulatory Visit | Attending: Neurosurgery | Admitting: Neurosurgery

## 2019-08-03 DIAGNOSIS — Z01818 Encounter for other preprocedural examination: Secondary | ICD-10-CM | POA: Insufficient documentation

## 2019-08-03 DIAGNOSIS — I491 Atrial premature depolarization: Secondary | ICD-10-CM | POA: Insufficient documentation

## 2019-08-03 HISTORY — DX: Unspecified cataract: H26.9

## 2019-08-03 HISTORY — DX: Dependence on wheelchair: Z99.3

## 2019-08-03 HISTORY — DX: Gout, unspecified: M10.9

## 2019-08-03 HISTORY — DX: Legal blindness, as defined in USA: H54.8

## 2019-08-03 HISTORY — DX: Presence of dental prosthetic device (complete) (partial): Z97.2

## 2019-08-03 LAB — HEMOGLOBIN A1C
Hgb A1c MFr Bld: 6.3 % — ABNORMAL HIGH (ref 4.8–5.6)
Mean Plasma Glucose: 134.11 mg/dL

## 2019-08-03 LAB — BASIC METABOLIC PANEL
Anion gap: 12 (ref 5–15)
BUN: 16 mg/dL (ref 8–23)
CO2: 28 mmol/L (ref 22–32)
Calcium: 10.1 mg/dL (ref 8.9–10.3)
Chloride: 101 mmol/L (ref 98–111)
Creatinine, Ser: 1.03 mg/dL (ref 0.61–1.24)
GFR calc Af Amer: >60 mL/min (ref >60)
GFR calc non Af Amer: >60 mL/min (ref >60)
Glucose, Bld: 118 mg/dL — ABNORMAL HIGH (ref 70–99)
Potassium: 3.8 mmol/L (ref 3.5–5.1)
Sodium: 141 mmol/L (ref 135–145)

## 2019-08-03 LAB — CBC
HCT: 43.9 % (ref 39.0–52.0)
Hemoglobin: 14.2 g/dL (ref 13.0–17.0)
MCH: 26.8 pg (ref 26.0–34.0)
MCHC: 32.3 g/dL (ref 30.0–36.0)
MCV: 83 fL (ref 80.0–100.0)
Platelets: 169 10*3/uL (ref 150–400)
RBC: 5.29 MIL/uL (ref 4.22–5.81)
RDW: 12.4 % (ref 11.5–15.5)
WBC: 5.5 10*3/uL (ref 4.0–10.5)
nRBC: 0 % (ref 0.0–0.2)

## 2019-08-03 LAB — TYPE AND SCREEN
ABO/RH(D): O POS
Antibody Screen: NEGATIVE

## 2019-08-03 LAB — SURGICAL PCR SCREEN
MRSA, PCR: NEGATIVE
Staphylococcus aureus: NEGATIVE

## 2019-08-03 LAB — ABO/RH: ABO/RH(D): O POS

## 2019-08-03 LAB — SARS CORONAVIRUS 2 (TAT 6-24 HRS): SARS Coronavirus 2: NEGATIVE

## 2019-08-03 LAB — GLUCOSE, CAPILLARY: Glucose-Capillary: 93 mg/dL (ref 70–99)

## 2019-08-03 NOTE — Progress Notes (Signed)
[facility] DRUG STORE #[id number] [place], [place] - [phone] AT [facility] OF [facility] [address] [place] [address] Phone: [phone] Fax: [phone]       Your procedure is scheduled on Friday, January 22nd.  Report to [facility] Main Entrance "A" at 5:30 A.M., and check in at the Admitting office.  Call this number if you have problems the morning of surgery:  [phone]  Call [phone] if you have any questions prior to your surgery date Monday-Friday 8am-4pm    Remember:  Do not eat or drink after midnight the night before your surgery (Thurs)     Take these medicines the morning of surgery with A SIP OF WATER   Amlodipine (Norvasc)  Atorvastatin (Lipitor)  All Eye drops - if needed  Metoprolol    STOP now taking any Aspirin (unless otherwise instructed by your surgeon), Aleve, Naproxen, Ibuprofen, Motrin, Advil, Goody's, BC's, all herbal medications, fish oil, and all vitamins.   WHAT DO I DO ABOUT MY DIABETES MEDICATION?   [name] Kitchen Do not take oral diabetes medicines (pills) the morning of surgery. - Metformin   HOW TO MANAGE YOUR DIABETES BEFORE AND AFTER SURGERY  Why is it important to control my blood sugar before and after surgery? . Improving blood sugar levels before and after surgery helps healing and can limit problems. . A way of improving blood sugar control is eating a healthy diet by: o  Eating less sugar and carbohydrates o  Increasing activity/exercise o  Talking with your doctor about reaching your blood sugar goals . High blood sugars (greater than 180 mg/dL) can raise your risk of infections and slow your recovery, so you will need to focus on controlling your diabetes during the weeks before surgery. . Make sure that the doctor who takes care of your diabetes knows about your planned surgery including the date and location.  How do I manage my blood sugar before surgery? . Check your blood sugar at least 4  times a day, starting 2 days before surgery, to make sure that the level is not too high or low. . Check your blood sugar the morning of your surgery when you wake up and every 2 hours until you get to the Short Stay unit. o If your blood sugar is less than 70 mg/dL, you will need to treat for low blood sugar: - Treat a low blood sugar (less than 70 mg/dL) with  cup of clear juice (cranberry or apple), 4 glucose tablets. - Recheck blood sugar in 15 minutes after treatment (to make sure it is greater than 70 mg/dL). If your blood sugar is not greater than 70 mg/dL on recheck, call [phone] for further instructions. . Report your blood sugar to the short stay nurse when you get to Short Stay.  . If you are admitted to the hospital after surgery: o Your blood sugar will be checked by the staff and you will probably be given insulin after surgery (instead of oral diabetes medicines) to make sure you have good blood sugar levels. o The goal for blood sugar control after surgery is 80-180 mg/dL.    The Morning of Surgery  Do not wear jewelry.  Do not wear lotions, powders, colognes or deodorant  Men may shave face and neck.  Do not bring valuables to the hospital.  [facility] is not responsible for any belongings or valuables.  If you are a smoker, DO NOT Smoke  24 hours prior to surgery  If you wear a CPAP at night please bring your mask the morning of surgery   Remember that you must have someone to transport you home after your surgery, and remain with you for 24 hours if you are discharged the same day.   Please bring cases for contacts, glasses, hearing aids, dentures or bridgework because it cannot be worn into surgery.    Leave your suitcase in the car.  After surgery it may be brought to your room.  For patients admitted to the hospital, discharge time will be determined by your treatment team.  Patients discharged the day of surgery will not be allowed to drive home.     Special instructions:   [place]- Preparing For Surgery  Before surgery, you can play an important role. Because skin is not sterile, your skin needs to be as free of germs as possible. You can reduce the number of germs on your skin by washing with CHG (chlorahexidine gluconate) Soap before surgery.  CHG is an antiseptic cleaner which kills germs and bonds with the skin to continue killing germs even after washing.    Oral Hygiene is also important to reduce your risk of infection.  Remember - BRUSH YOUR TEETH THE MORNING OF SURGERY WITH YOUR REGULAR TOOTHPASTE  Please do not use if you have an allergy to CHG or antibacterial soaps. If your skin becomes reddened/irritated stop using the CHG.  Do not shave (including legs and underarms) for at least 48 hours prior to first CHG shower. It is OK to shave your face.  Please follow these instructions carefully.   1. Shower the NIGHT BEFORE SURGERY Thurs and the MORNING OF SURGERY Fri with CHG Soap.   2. If you chose to wash your hair, wash your hair first as usual with your normal shampoo.  3. After you shampoo, rinse your hair and body thoroughly to remove the shampoo.  4. Use CHG as you would any other liquid soap. You can apply CHG directly to the skin and wash gently with a scrungie or a clean washcloth.   5. Apply the CHG Soap to your body ONLY FROM THE NECK DOWN.  Do not use on open wounds or open sores. Avoid contact with your eyes, ears, mouth and genitals (private parts). Wash Face and genitals (private parts)  with your normal soap.   6. Wash thoroughly, paying special attention to the area where your surgery will be performed.  7. Thoroughly rinse your body with warm water from the neck down.  8. DO NOT shower/wash with your normal soap after using and rinsing off the CHG Soap.  9. Pat yourself dry with a CLEAN TOWEL.  10. Wear CLEAN PAJAMAS to bed the night before surgery, wear comfortable clothes the morning of  surgery  11. Place CLEAN SHEETS on your bed the night of your first shower and DO NOT SLEEP WITH PETS.    Day of Surgery:  Please shower the morning of surgery with the CHG soap Do not apply any deodorants/lotions. Please wear clean clothes to the hospital/surgery center.   Remember to brush your teeth WITH YOUR REGULAR TOOTHPASTE.   Please read over the following fact sheets that you were given.

## 2019-08-03 NOTE — Progress Notes (Signed)
Patient denies shortness of breath, fever, cough and chest pain.  SIster Kamon Fahr  Is with patient at PAT appointment due to vision impairment (Legally Blind).  Patient lives with his sister.  PCP - Dr Knox Royalty, [address] Cardiologist - denies Neurology - Dr Levert Feinstein Ophthaamology - Dr Andres Ege  Chest x-ray - denies EKG - 08/03/19 Stress Test - denies ECHO - denies Cardiac Cath - denies  Fasting Blood Sugar - 90-100s Checks Blood Sugar _0-1_ times a day  Aspirin Instructions: Last dose on 07/27/19  Anesthesia review: Yes  Coronavirus Screening Have you experienced the following symptoms:  Cough yes/no: No Fever (>100.30F)  yes/no: No Runny nose yes/no: No Sore throat yes/no: No Difficulty breathing/shortness of breath  yes/no: No  Have you traveled in the last 14 days and where? yes/no: No  Patient verbalized understanding of instructions that were given to them at the PAT appointment.

## 2019-08-04 NOTE — Anesthesia Preprocedure Evaluation (Addendum)
Anesthesia Evaluation  Patient identified by MRN, date of birth, ID band Patient awake    Reviewed: Allergy & Precautions, NPO status , Patient's Chart, lab work & pertinent test results  History of Anesthesia Complications Negative for: history of anesthetic complications  Airway Mallampati: II  TM Distance: >3 FB Neck ROM: Limited    Dental  (+) Teeth Intact, Dental Advisory Given   Pulmonary neg pulmonary ROS,    Pulmonary exam normal        Cardiovascular hypertension, Pt. on medications and Pt. on home beta blockers Normal cardiovascular exam     Neuro/Psych negative neurological ROS  negative psych ROS   GI/Hepatic negative GI ROS, Neg liver ROS,   Endo/Other  diabetes  Renal/GU negative Renal ROS     Musculoskeletal negative musculoskeletal ROS (+)   Abdominal   Peds  Hematology negative hematology ROS (+)   Anesthesia Other Findings Day of surgery medications reviewed with the patient.  Reproductive/Obstetrics                          Anesthesia Physical Anesthesia Plan  ASA: II  Anesthesia Plan: General   Post-op Pain Management:    Induction: Intravenous  PONV Risk Score and Plan: 4 or greater and Ondansetron, Dexamethasone, Midazolam and Scopolamine patch - Pre-op  Airway Management Planned: Oral ETT  Additional Equipment:   Intra-op Plan:   Post-operative Plan: Extubation in OR  Informed Consent: I have reviewed the patients History and Physical, chart, labs and discussed the procedure including the risks, benefits and alternatives for the proposed anesthesia with the patient or authorized representative who has indicated his/her understanding and acceptance.     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   Recent workup by neurology Dr. Terrace Arabia for gait abnormality  and numbness. Per last OV note 06/30/19, "I reviewed feedback from neurosurgeon Dr. Conchita Paris.  MRI confirmed cervical myelopathy C3-4 through C6-7, with T2 signal changes, although he does have ventriculomegaly, his symptoms are most consistent with cervical and myelopathy, planning on anterior cervical discectomy and fusion at C3-4, C4-5, C5-6, C6-7.")      Anesthesia Quick Evaluation

## 2019-08-04 NOTE — H&P (Addendum)
Chief Complaint   Cervical stenosis  HPI   HPI: Brian Daniels is a 64 y.o. male who was found to have multilevel mod-severe cervical stenosis with myelopathy during workup for gait instability.  He notes primarily left-sided numbness and tingling and weakness.  Weakness has been fairly progressive over the last 2 months and he has now essentially wheelchair-bound. He denies bowel or bladder dysfunction.  He presents today for cervical decompression and fusion.  He is without any concerns.  Patient Active Problem List   Diagnosis Date Noted  . Cervical myelopathy (HCC) 06/30/2019  . Bilateral low back pain without sciatica 06/13/2019  . Weakness 06/13/2019  . Paresthesia 06/13/2019  . Urinary urgency 06/13/2019  . Gait abnormality 06/13/2019    PMH: Past Medical History:  Diagnosis Date  . Cataracts, bilateral    removed by surgery  . Diabetes mellitus without complication (HCC)    type 2  . Glaucoma    bilateral  . Gout   . Hypercholesteremia   . Hypertension   . Left atrial enlargement   . Legally blind    bilateral  . Neuropathy    hands, arm  . Numbness    hands, arms  . Wears partial dentures    lower  . Wheelchair bound    since 05/30/19    PSH: Past Surgical History:  Procedure Laterality Date  . COLONOSCOPY    . EYE SURGERY Bilateral    multiple surgeries bilateral -   . MULTIPLE TOOTH EXTRACTIONS    . WISDOM TOOTH EXTRACTION      No medications prior to admission.    SH: Social History   Tobacco Use  . Smoking status: Never Smoker  . Smokeless tobacco: Never Used  Substance Use Topics  . Alcohol use: Not Currently  . Drug use: Never    MEDS: Prior to Admission medications   Medication Sig Start Date End Date Taking? Authorizing Provider  amLODipine (NORVASC) 5 MG tablet Take 5 mg by mouth daily. 05/01/19  Yes [provider]  Ascorbic Acid (VITAMIN C PO) Take 1,000 mg by mouth daily.    Yes [provider]    atorvastatin (LIPITOR) 20 MG tablet Take 20 mg by mouth daily. 05/01/19  Yes [provider]  benazepril (LOTENSIN) 10 MG tablet Take 10 mg by mouth daily. 05/01/19  Yes [provider]  BL CALCIUM-MAGNESIUM-ZINC PO Take 1 tablet by mouth daily.   Yes [provider]  brimonidine (ALPHAGAN) 0.2 % ophthalmic solution Place 1 drop into both eyes 3 (three) times daily.   Yes [provider]  Cyanocobalamin (B-12 PO) Take 5,000 mcg by mouth daily.    Yes [provider]  GINSENG PO Take 2 tablets by mouth daily as needed (energy boost).   Yes [provider]  indomethacin (INDOCIN) 50 MG capsule Take 50 mg by mouth 3 (three) times daily as needed for moderate pain.  04/07/19  Yes [provider]  metFORMIN (GLUCOPHAGE-XR) 500 MG 24 hr tablet Take 500 mg by mouth at bedtime.  02/02/13  Yes [provider]  metoprolol succinate (TOPROL-XL) 25 MG 24 hr tablet Take 25 mg by mouth daily. 05/02/19  Yes [provider]  Omega-3 Fatty Acids (FISH OIL) 1000 MG CAPS Take 1,000 mg by mouth daily.    Yes [provider]  OVER THE COUNTER MEDICATION Take 1 tablet by mouth daily. Super Beta Prostate   Yes [provider]  prednisoLONE acetate (PRED FORTE) 1 %  ophthalmic suspension Place 1 drop into the right eye 2 (two) times daily.   Yes [provider]  timolol (BETIMOL) 0.5 % ophthalmic solution Place 1 drop into both eyes 2 (two) times daily.   Yes [provider]  vitamin E 180 MG (400 UNITS) capsule Take 180 mg by mouth daily.    Yes [provider]  aspirin EC 81 MG tablet Take 81 mg by mouth daily.     [provider]    ALLERGY: No Known Allergies  Social History   Tobacco Use  . Smoking status: Never Smoker  . Smokeless tobacco: Never Used  Substance Use Topics  . Alcohol use: Not Currently     Family History  Problem Relation Age of Onset  . Hyperlipidemia  Mother   . Diabetes Mother   . Hypertension Father      ROS   ROS  Exam   There were no vitals filed for this visit. General appearance: WDWN, NAD Eyes: No scleral injection Cardiovascular: Regular rate and rhythm without murmurs, rubs, gallops. No edema or variciosities. Distal pulses normal. Pulmonary: Effort normal, non-labored breathing Musculoskeletal:     Muscle tone upper extremities: Normal    Muscle tone lower extremities: Normal  Right Left Deltoid:  2/5 Biceps: 4/5 4-/5 Triceps:  3/5 Infraspinatus:  2/5 Wrist Extensor:  3/5 Grip: 4+/5 4-/5 Finger Extensor:  4-/5 Hip Flexor:  2/5 Knee Extensor:  3/5 Knee Flexor:  2/5 Tib Anterior:  1/5 EHL:  1/5 Medial Gastroc:  2/5  Neurological Mental Status:    - Patient is awake, alert, oriented to person, place, month, year, and situation    - Patient is able to give a clear and coherent history.    - No signs of aphasia or neglect Cranial Nerves    - II: Visual Fields are full. PERRL    - III/IV/VI: EOMI without ptosis or diploplia.     - V: Facial sensation is grossly normal    - VII: Facial movement is symmetric.     - VIII: hearing is intact to voice    - X: Uvula elevates symmetrically    - XI: Shoulder shrug is symmetric.    - XII: tongue is midline without atrophy or fasciculations.  Sensory: Sensation grossly intact to LT Deep Tendon Reflexes    - diffuse hyperreflexia   Results - Imaging/Labs   Results for orders placed or performed during the hospital encounter of 08/03/19 (from the past 48 hour(s))  SARS CORONAVIRUS 2 (TAT 6-24 HRS) Nasopharyngeal Nasopharyngeal Swab     Status: None   Collection Time: 08/03/19 12:47 PM   Specimen: Nasopharyngeal Swab  Result Value Ref Range   SARS Coronavirus 2 NEGATIVE NEGATIVE    Comment: (NOTE) SARS-CoV-2 target nucleic acids are NOT DETECTED. The SARS-CoV-2 RNA is generally detectable in upper and lower respiratory specimens during the acute phase of  infection. Negative results do not preclude SARS-CoV-2 infection, do not rule out co-infections with other pathogens, and should not be used as the sole basis for treatment or other patient management decisions. Negative results must be combined with clinical observations, patient history, and epidemiological information. The expected result is Negative. Fact Sheet for Patients: HairSlick.no Fact Sheet for Healthcare Providers: quierodirigir.com This test is not yet approved or cleared by the [place] FDA and  has been authorized for detection and/or diagnosis of SARS-CoV-2 by FDA under an Emergency Use Authorization (EUA). This EUA will remain  in effect (meaning this  test can be used) for the duration of the COVID-19 declaration under Section 56 4(b)(1) of the Act, 21 U.S.C. section 360bbb-3(b)(1), unless the authorization is terminated or revoked sooner. Performed at [facility], [place] [address] [place], [place] [address]     No results found.  IMAGING:  MRI of the cervical spine dated 06/23/2019 was personally reviewed.  This does demonstrate reversal of the normal cervical lordosis.  There is broad-based disc osteophyte complex with resultant severe stenosis at C3-4 and C5-6 and C6-7.  There is moderate stenosis at C4-5.  There is resultant T2 signal change within the spinal cord at the C3-4 level.  MRI of the brain dated 06/27/2019 was also personally reviewed.  Primary finding is that of significant ventriculomegaly, somewhat out of proportion to the degree of brain atrophy.  Impression/Plan   64 y.o. male with approximately 2 months of progressively worsening symptoms of cervical myelopathy, with MRI confirming stenosis with T2 signal change from C3-4 through C6-7.  Although he does have ventriculomegaly, his symptoms are much more consistent with cervical myelopathy rather than normal pressure hydrocephalus.     We will proceed with anterior cervical diskectomy and fusion at C3-4, C4-5, C5-6 and C6-7.  Risks, benefits and alternatives were discussed.  Patient stated understanding and wished to proceed.  Ferne Reus, PA-C [facility] [employer]

## 2019-08-05 ENCOUNTER — Encounter (HOSPITAL_COMMUNITY)
Admission: RE | Disposition: A | Discharge status: Rehabilitation Facility | Payer: Self-pay | Source: Home / Self Care | Attending: Neurosurgery

## 2019-08-05 ENCOUNTER — Inpatient Hospital Stay (HOSPITAL_COMMUNITY): Payer: Medicare Other | Admitting: Physician Assistant

## 2019-08-05 ENCOUNTER — Inpatient Hospital Stay (HOSPITAL_COMMUNITY): Payer: Medicare Other | Admitting: Anesthesiology

## 2019-08-05 ENCOUNTER — Other Ambulatory Visit: Payer: Self-pay

## 2019-08-05 ENCOUNTER — Inpatient Hospital Stay (HOSPITAL_COMMUNITY)
Admission: RE | Admit: 2019-08-05 | Discharge: 2019-08-09 | DRG: 472 | Disposition: A | Discharge status: Rehabilitation Facility | Payer: Medicare Other | Attending: Neurosurgery | Admitting: Neurosurgery

## 2019-08-05 ENCOUNTER — Encounter (HOSPITAL_COMMUNITY): Payer: Self-pay | Admitting: Neurosurgery

## 2019-08-05 ENCOUNTER — Inpatient Hospital Stay (HOSPITAL_COMMUNITY): Payer: Medicare Other

## 2019-08-05 DIAGNOSIS — R252 Cramp and spasm: Secondary | ICD-10-CM | POA: Diagnosis present

## 2019-08-05 DIAGNOSIS — E785 Hyperlipidemia, unspecified: Secondary | ICD-10-CM | POA: Diagnosis present

## 2019-08-05 DIAGNOSIS — K592 Neurogenic bowel, not elsewhere classified: Secondary | ICD-10-CM | POA: Diagnosis present

## 2019-08-05 DIAGNOSIS — M7989 Other specified soft tissue disorders: Secondary | ICD-10-CM | POA: Diagnosis not present

## 2019-08-05 DIAGNOSIS — Z79899 Other long term (current) drug therapy: Secondary | ICD-10-CM

## 2019-08-05 DIAGNOSIS — Z20822 Contact with and (suspected) exposure to covid-19: Secondary | ICD-10-CM | POA: Diagnosis present

## 2019-08-05 DIAGNOSIS — M5001 Cervical disc disorder with myelopathy,  high cervical region: Secondary | ICD-10-CM | POA: Diagnosis present

## 2019-08-05 DIAGNOSIS — Z993 Dependence on wheelchair: Secondary | ICD-10-CM | POA: Diagnosis not present

## 2019-08-05 DIAGNOSIS — E7800 Pure hypercholesterolemia, unspecified: Secondary | ICD-10-CM | POA: Diagnosis present

## 2019-08-05 DIAGNOSIS — G992 Myelopathy in diseases classified elsewhere: Secondary | ICD-10-CM | POA: Diagnosis not present

## 2019-08-05 DIAGNOSIS — H409 Unspecified glaucoma: Secondary | ICD-10-CM | POA: Diagnosis present

## 2019-08-05 DIAGNOSIS — Z419 Encounter for procedure for purposes other than remedying health state, unspecified: Secondary | ICD-10-CM

## 2019-08-05 DIAGNOSIS — N319 Neuromuscular dysfunction of bladder, unspecified: Secondary | ICD-10-CM | POA: Diagnosis present

## 2019-08-05 DIAGNOSIS — K5900 Constipation, unspecified: Secondary | ICD-10-CM | POA: Diagnosis present

## 2019-08-05 DIAGNOSIS — Z833 Family history of diabetes mellitus: Secondary | ICD-10-CM | POA: Diagnosis not present

## 2019-08-05 DIAGNOSIS — E119 Type 2 diabetes mellitus without complications: Secondary | ICD-10-CM | POA: Diagnosis present

## 2019-08-05 DIAGNOSIS — M4802 Spinal stenosis, cervical region: Principal | ICD-10-CM | POA: Diagnosis present

## 2019-08-05 DIAGNOSIS — Z7984 Long term (current) use of oral hypoglycemic drugs: Secondary | ICD-10-CM | POA: Diagnosis not present

## 2019-08-05 DIAGNOSIS — I10 Essential (primary) hypertension: Secondary | ICD-10-CM | POA: Diagnosis present

## 2019-08-05 DIAGNOSIS — H548 Legal blindness, as defined in USA: Secondary | ICD-10-CM | POA: Diagnosis present

## 2019-08-05 DIAGNOSIS — G959 Disease of spinal cord, unspecified: Secondary | ICD-10-CM | POA: Diagnosis not present

## 2019-08-05 DIAGNOSIS — R531 Weakness: Secondary | ICD-10-CM | POA: Diagnosis present

## 2019-08-05 HISTORY — PX: ANTERIOR CERVICAL DECOMPRESSION/DISCECTOMY FUSION 4 LEVELS: SHX5556

## 2019-08-05 LAB — GLUCOSE, CAPILLARY
Glucose-Capillary: 69 mg/dL — ABNORMAL LOW (ref 70–99)
Glucose-Capillary: 120 mg/dL — ABNORMAL HIGH (ref 70–99)
Glucose-Capillary: 105 mg/dL — ABNORMAL HIGH (ref 70–99)
Glucose-Capillary: 112 mg/dL — ABNORMAL HIGH (ref 70–99)
Glucose-Capillary: 208 mg/dL — ABNORMAL HIGH (ref 70–99)

## 2019-08-05 SURGERY — ANTERIOR CERVICAL DECOMPRESSION/DISCECTOMY FUSION 4 LEVELS
Anesthesia: General | Site: Spine Cervical

## 2019-08-05 MED ORDER — LACTATED RINGERS IV SOLN: INTRAVENOUS | Status: DC | PRN

## 2019-08-05 MED ORDER — SODIUM CHLORIDE 0.9% FLUSH: 3.0000 mL | INTRAVENOUS | Status: DC | PRN

## 2019-08-05 MED ORDER — DEXAMETHASONE SODIUM PHOSPHATE 10 MG/ML IJ SOLN
INTRAMUSCULAR | Status: DC | PRN
  Administered 2019-08-05: 10 mg via INTRAVENOUS

## 2019-08-05 MED ORDER — ONDANSETRON HCL 4 MG/2ML IJ SOLN: 4.0000 mg | Freq: Four times a day (QID) | INTRAMUSCULAR | Status: DC | PRN

## 2019-08-05 MED ORDER — ROCURONIUM BROMIDE 10 MG/ML (PF) SYRINGE
PREFILLED_SYRINGE | INTRAVENOUS | Status: DC | PRN
  Administered 2019-08-05: 60 mg via INTRAVENOUS
  Administered 2019-08-05 (×2): 10 mg via INTRAVENOUS
  Administered 2019-08-05: 30 mg via INTRAVENOUS
  Administered 2019-08-05: 10 mg via INTRAVENOUS

## 2019-08-05 MED ORDER — THROMBIN 5000 UNITS EX SOLR
CUTANEOUS | Status: AC
  Filled 2019-08-05: qty 5000

## 2019-08-05 MED ORDER — GLYCOPYRROLATE PF 0.2 MG/ML IJ SOSY
PREFILLED_SYRINGE | INTRAMUSCULAR | Status: AC
  Filled 2019-08-05: qty 1

## 2019-08-05 MED ORDER — PHENYLEPHRINE 40 MCG/ML (10ML) SYRINGE FOR IV PUSH (FOR BLOOD PRESSURE SUPPORT)
PREFILLED_SYRINGE | INTRAVENOUS | Status: AC
  Filled 2019-08-05: qty 10

## 2019-08-05 MED ORDER — CEFAZOLIN SODIUM-DEXTROSE 2-4 GM/100ML-% IV SOLN
2.0000 g | Freq: Three times a day (TID) | INTRAVENOUS | Status: AC
  Administered 2019-08-05 – 2019-08-06 (×2): 2 g via INTRAVENOUS
  Filled 2019-08-05 (×3): qty 100

## 2019-08-05 MED ORDER — DEXTROSE 50 % IV SOLN
INTRAVENOUS | Status: AC
  Filled 2019-08-05: qty 50

## 2019-08-05 MED ORDER — SENNA 8.6 MG PO TABS
1.0000 | ORAL_TABLET | Freq: Two times a day (BID) | ORAL | Status: DC
  Administered 2019-08-05 – 2019-08-09 (×7): 8.6 mg via ORAL
  Filled 2019-08-05 (×8): qty 1

## 2019-08-05 MED ORDER — ACETAMINOPHEN 650 MG RE SUPP: 650.0000 mg | RECTAL | Status: DC | PRN

## 2019-08-05 MED ORDER — BISACODYL 10 MG RE SUPP: 10.0000 mg | Freq: Every day | RECTAL | Status: DC | PRN

## 2019-08-05 MED ORDER — EPHEDRINE SULFATE-NACL 50-0.9 MG/10ML-% IV SOSY
PREFILLED_SYRINGE | INTRAVENOUS | Status: DC | PRN
  Administered 2019-08-05: 5 mg via INTRAVENOUS

## 2019-08-05 MED ORDER — SUGAMMADEX SODIUM 200 MG/2ML IV SOLN
INTRAVENOUS | Status: DC | PRN
  Administered 2019-08-05: 160 mg via INTRAVENOUS

## 2019-08-05 MED ORDER — ATORVASTATIN CALCIUM 10 MG PO TABS
20.0000 mg | ORAL_TABLET | Freq: Every day | ORAL | Status: DC
  Administered 2019-08-06 – 2019-08-09 (×4): 20 mg via ORAL
  Filled 2019-08-05 (×4): qty 2

## 2019-08-05 MED ORDER — SODIUM CHLORIDE 0.9 % IV SOLN
250.0000 mL | INTRAVENOUS | Status: DC
  Administered 2019-08-05: 250 mL via INTRAVENOUS

## 2019-08-05 MED ORDER — PROPOFOL 10 MG/ML IV BOLUS
INTRAVENOUS | Status: AC
  Filled 2019-08-05: qty 20

## 2019-08-05 MED ORDER — HYDROCODONE-ACETAMINOPHEN 5-325 MG PO TABS
1.0000 | ORAL_TABLET | ORAL | Status: DC | PRN
  Administered 2019-08-05 – 2019-08-09 (×8): 1 via ORAL
  Filled 2019-08-05 (×8): qty 1

## 2019-08-05 MED ORDER — HYDROMORPHONE HCL 1 MG/ML IJ SOLN: 0.5000 mg | INTRAMUSCULAR | Status: DC | PRN

## 2019-08-05 MED ORDER — ONDANSETRON HCL 4 MG/2ML IJ SOLN
INTRAMUSCULAR | Status: DC | PRN
  Administered 2019-08-05: 4 mg via INTRAVENOUS

## 2019-08-05 MED ORDER — CEFAZOLIN SODIUM-DEXTROSE 2-4 GM/100ML-% IV SOLN
2.0000 g | INTRAVENOUS | Status: AC
  Administered 2019-08-05 (×2): 2 g via INTRAVENOUS
  Filled 2019-08-05: qty 100

## 2019-08-05 MED ORDER — METFORMIN HCL ER 500 MG PO TB24
500.0000 mg | ORAL_TABLET | Freq: Every day | ORAL | Status: DC
  Administered 2019-08-05 – 2019-08-08 (×4): 500 mg via ORAL
  Filled 2019-08-05 (×5): qty 1

## 2019-08-05 MED ORDER — TIMOLOL MALEATE 0.5 % OP SOLN
1.0000 [drp] | Freq: Two times a day (BID) | OPHTHALMIC | Status: DC
  Administered 2019-08-05 – 2019-08-09 (×8): 1 [drp] via OPHTHALMIC
  Filled 2019-08-05: qty 5

## 2019-08-05 MED ORDER — BENAZEPRIL HCL 20 MG PO TABS
10.0000 mg | ORAL_TABLET | Freq: Every day | ORAL | Status: DC
  Administered 2019-08-05 – 2019-08-09 (×5): 10 mg via ORAL
  Filled 2019-08-05 (×5): qty 1

## 2019-08-05 MED ORDER — LIDOCAINE 2% (20 MG/ML) 5 ML SYRINGE
INTRAMUSCULAR | Status: AC
  Filled 2019-08-05: qty 5

## 2019-08-05 MED ORDER — FENTANYL CITRATE (PF) 250 MCG/5ML IJ SOLN
INTRAMUSCULAR | Status: AC
  Filled 2019-08-05: qty 5

## 2019-08-05 MED ORDER — SODIUM CHLORIDE 0.9 % IV SOLN: INTRAVENOUS | Status: DC

## 2019-08-05 MED ORDER — PROPOFOL 10 MG/ML IV BOLUS
INTRAVENOUS | Status: DC | PRN
  Administered 2019-08-05: 30 mg via INTRAVENOUS
  Administered 2019-08-05: 130 mg via INTRAVENOUS

## 2019-08-05 MED ORDER — AMLODIPINE BESYLATE 5 MG PO TABS
5.0000 mg | ORAL_TABLET | Freq: Every day | ORAL | Status: DC
  Administered 2019-08-06 – 2019-08-09 (×4): 5 mg via ORAL
  Filled 2019-08-05 (×4): qty 1

## 2019-08-05 MED ORDER — ONDANSETRON HCL 4 MG/2ML IJ SOLN
INTRAMUSCULAR | Status: AC
  Filled 2019-08-05: qty 2

## 2019-08-05 MED ORDER — MIDAZOLAM HCL 5 MG/5ML IJ SOLN
INTRAMUSCULAR | Status: DC | PRN
  Administered 2019-08-05: 2 mg via INTRAVENOUS

## 2019-08-05 MED ORDER — METHOCARBAMOL 500 MG PO TABS: 500.0000 mg | ORAL_TABLET | Freq: Four times a day (QID) | ORAL | Status: DC | PRN

## 2019-08-05 MED ORDER — FENTANYL CITRATE (PF) 250 MCG/5ML IJ SOLN
INTRAMUSCULAR | Status: DC | PRN
  Administered 2019-08-05: 50 ug via INTRAVENOUS
  Administered 2019-08-05: 100 ug via INTRAVENOUS
  Administered 2019-08-05: 50 ug via INTRAVENOUS

## 2019-08-05 MED ORDER — PHENYLEPHRINE HCL-NACL 10-0.9 MG/250ML-% IV SOLN
INTRAVENOUS | Status: DC | PRN
  Administered 2019-08-05: 75 ug/min via INTRAVENOUS
  Administered 2019-08-05: 30 ug/min via INTRAVENOUS

## 2019-08-05 MED ORDER — LIDOCAINE 2% (20 MG/ML) 5 ML SYRINGE
INTRAMUSCULAR | Status: DC | PRN
  Administered 2019-08-05: 100 mg via INTRAVENOUS

## 2019-08-05 MED ORDER — SENNOSIDES-DOCUSATE SODIUM 8.6-50 MG PO TABS: 1.0000 | ORAL_TABLET | Freq: Every evening | ORAL | Status: DC | PRN

## 2019-08-05 MED ORDER — ROCURONIUM BROMIDE 10 MG/ML (PF) SYRINGE
PREFILLED_SYRINGE | INTRAVENOUS | Status: AC
  Filled 2019-08-05: qty 10

## 2019-08-05 MED ORDER — CHLORHEXIDINE GLUCONATE CLOTH 2 % EX PADS: 6.0000 | MEDICATED_PAD | Freq: Once | CUTANEOUS | Status: DC

## 2019-08-05 MED ORDER — BUPIVACAINE HCL (PF) 0.5 % IJ SOLN
INTRAMUSCULAR | Status: AC
  Filled 2019-08-05: qty 30

## 2019-08-05 MED ORDER — ACETAMINOPHEN 325 MG PO TABS: 650.0000 mg | ORAL_TABLET | ORAL | Status: DC | PRN

## 2019-08-05 MED ORDER — CELECOXIB 200 MG PO CAPS
400.0000 mg | ORAL_CAPSULE | Freq: Once | ORAL | Status: AC
  Administered 2019-08-05: 400 mg via ORAL
  Filled 2019-08-05: qty 2

## 2019-08-05 MED ORDER — INSULIN ASPART 100 UNIT/ML ~~LOC~~ SOLN
0.0000 [IU] | Freq: Three times a day (TID) | SUBCUTANEOUS | Status: DC
  Administered 2019-08-06 – 2019-08-08 (×3): 2 [IU] via SUBCUTANEOUS

## 2019-08-05 MED ORDER — PREDNISOLONE ACETATE 1 % OP SUSP
1.0000 [drp] | Freq: Two times a day (BID) | OPHTHALMIC | Status: DC
  Administered 2019-08-05 – 2019-08-09 (×8): 1 [drp] via OPHTHALMIC
  Filled 2019-08-05: qty 5

## 2019-08-05 MED ORDER — EPHEDRINE 5 MG/ML INJ
INTRAVENOUS | Status: AC
  Filled 2019-08-05: qty 10

## 2019-08-05 MED ORDER — MENTHOL 3 MG MT LOZG: 1.0000 | LOZENGE | OROMUCOSAL | Status: DC | PRN

## 2019-08-05 MED ORDER — SODIUM CHLORIDE 0.9 % IV SOLN
INTRAVENOUS | Status: DC | PRN
  Administered 2019-08-05: 10:00:00 500 mL

## 2019-08-05 MED ORDER — TIMOLOL HEMIHYDRATE 0.5 % OP SOLN: 1.0000 [drp] | Freq: Two times a day (BID) | OPHTHALMIC | Status: DC

## 2019-08-05 MED ORDER — LIDOCAINE-EPINEPHRINE 1 %-1:100000 IJ SOLN
INTRAMUSCULAR | Status: DC | PRN
  Administered 2019-08-05: 4 mL

## 2019-08-05 MED ORDER — GLYCOPYRROLATE PF 0.2 MG/ML IJ SOSY
PREFILLED_SYRINGE | INTRAMUSCULAR | Status: DC | PRN
  Administered 2019-08-05: .2 mg via INTRAVENOUS

## 2019-08-05 MED ORDER — THROMBIN 20000 UNITS EX SOLR
CUTANEOUS | Status: DC | PRN
  Administered 2019-08-05: 10:00:00 20 mL via TOPICAL

## 2019-08-05 MED ORDER — DEXAMETHASONE SODIUM PHOSPHATE 10 MG/ML IJ SOLN
INTRAMUSCULAR | Status: AC
  Filled 2019-08-05: qty 1

## 2019-08-05 MED ORDER — OXYCODONE HCL 5 MG PO TABS: 5.0000 mg | ORAL_TABLET | ORAL | Status: DC | PRN

## 2019-08-05 MED ORDER — INSULIN ASPART 100 UNIT/ML ~~LOC~~ SOLN
0.0000 [IU] | Freq: Every day | SUBCUTANEOUS | Status: DC
  Administered 2019-08-05: 2 [IU] via SUBCUTANEOUS

## 2019-08-05 MED ORDER — FLEET ENEMA 7-19 GM/118ML RE ENEM: 1.0000 | ENEMA | Freq: Once | RECTAL | Status: DC | PRN

## 2019-08-05 MED ORDER — 0.9 % SODIUM CHLORIDE (POUR BTL) OPTIME
TOPICAL | Status: DC | PRN
  Administered 2019-08-05: 1000 mL

## 2019-08-05 MED ORDER — METOPROLOL SUCCINATE ER 25 MG PO TB24
25.0000 mg | ORAL_TABLET | Freq: Every day | ORAL | Status: DC
  Administered 2019-08-06 – 2019-08-09 (×4): 25 mg via ORAL
  Filled 2019-08-05 (×5): qty 1

## 2019-08-05 MED ORDER — FENTANYL CITRATE (PF) 100 MCG/2ML IJ SOLN
25.0000 ug | INTRAMUSCULAR | Status: DC | PRN
  Administered 2019-08-05 (×2): 25 ug via INTRAVENOUS

## 2019-08-05 MED ORDER — FENTANYL CITRATE (PF) 100 MCG/2ML IJ SOLN
INTRAMUSCULAR | Status: AC
  Filled 2019-08-05: qty 2

## 2019-08-05 MED ORDER — DOCUSATE SODIUM 100 MG PO CAPS
100.0000 mg | ORAL_CAPSULE | Freq: Two times a day (BID) | ORAL | Status: DC
  Administered 2019-08-05 – 2019-08-09 (×8): 100 mg via ORAL
  Filled 2019-08-05 (×8): qty 1

## 2019-08-05 MED ORDER — SCOPOLAMINE 1 MG/3DAYS TD PT72
1.0000 | MEDICATED_PATCH | TRANSDERMAL | Status: DC
  Administered 2019-08-05 – 2019-08-08 (×2): 1.5 mg via TRANSDERMAL
  Filled 2019-08-05 (×3): qty 1

## 2019-08-05 MED ORDER — BUPIVACAINE HCL 0.5 % IJ SOLN
INTRAMUSCULAR | Status: DC | PRN
  Administered 2019-08-05: 4 mL

## 2019-08-05 MED ORDER — THROMBIN 20000 UNITS EX SOLR
CUTANEOUS | Status: AC
  Filled 2019-08-05: qty 20000

## 2019-08-05 MED ORDER — LIDOCAINE-EPINEPHRINE 1 %-1:100000 IJ SOLN
INTRAMUSCULAR | Status: AC
  Filled 2019-08-05: qty 1

## 2019-08-05 MED ORDER — DEXTROSE 50 % IV SOLN
12.5000 g | INTRAVENOUS | Status: AC
  Filled 2019-08-05: qty 50

## 2019-08-05 MED ORDER — THROMBIN 5000 UNITS EX SOLR
OROMUCOSAL | Status: DC | PRN
  Administered 2019-08-05 (×2): 5 mL via TOPICAL

## 2019-08-05 MED ORDER — PHENYLEPHRINE 40 MCG/ML (10ML) SYRINGE FOR IV PUSH (FOR BLOOD PRESSURE SUPPORT)
PREFILLED_SYRINGE | INTRAVENOUS | Status: DC | PRN
  Administered 2019-08-05: 80 ug via INTRAVENOUS

## 2019-08-05 MED ORDER — BRIMONIDINE TARTRATE 0.2 % OP SOLN
1.0000 [drp] | Freq: Three times a day (TID) | OPHTHALMIC | Status: DC
  Administered 2019-08-05 – 2019-08-09 (×12): 1 [drp] via OPHTHALMIC
  Filled 2019-08-05: qty 5

## 2019-08-05 MED ORDER — ALBUMIN HUMAN 5 % IV SOLN: INTRAVENOUS | Status: DC | PRN

## 2019-08-05 MED ORDER — ACETAMINOPHEN 500 MG PO TABS
1000.0000 mg | ORAL_TABLET | Freq: Four times a day (QID) | ORAL | Status: AC
  Administered 2019-08-05 – 2019-08-06 (×3): 1000 mg via ORAL
  Filled 2019-08-05 (×3): qty 2

## 2019-08-05 MED ORDER — MIDAZOLAM HCL 2 MG/2ML IJ SOLN
INTRAMUSCULAR | Status: AC
  Filled 2019-08-05: qty 2

## 2019-08-05 MED ORDER — ACETAMINOPHEN 500 MG PO TABS
1000.0000 mg | ORAL_TABLET | Freq: Once | ORAL | Status: AC
  Administered 2019-08-05: 1000 mg via ORAL
  Filled 2019-08-05: qty 2

## 2019-08-05 MED ORDER — METHOCARBAMOL 1000 MG/10ML IJ SOLN
500.0000 mg | Freq: Four times a day (QID) | INTRAVENOUS | Status: DC | PRN
  Filled 2019-08-05: qty 5

## 2019-08-05 MED ORDER — SODIUM CHLORIDE 0.9% FLUSH
3.0000 mL | Freq: Two times a day (BID) | INTRAVENOUS | Status: DC
  Administered 2019-08-06 – 2019-08-09 (×7): 3 mL via INTRAVENOUS

## 2019-08-05 MED ORDER — PROMETHAZINE HCL 25 MG/ML IJ SOLN: 6.2500 mg | INTRAMUSCULAR | Status: DC | PRN

## 2019-08-05 MED ORDER — CEFAZOLIN SODIUM 1 G IJ SOLR
INTRAMUSCULAR | Status: AC
  Filled 2019-08-05: qty 20

## 2019-08-05 MED ORDER — ONDANSETRON HCL 4 MG PO TABS: 4.0000 mg | ORAL_TABLET | Freq: Four times a day (QID) | ORAL | Status: DC | PRN

## 2019-08-05 MED ORDER — PHENOL 1.4 % MT LIQD
1.0000 | OROMUCOSAL | Status: DC | PRN
  Administered 2019-08-05 – 2019-08-08 (×3): 1 via OROMUCOSAL
  Filled 2019-08-05: qty 177

## 2019-08-05 SURGICAL SUPPLY — 67 items
BAG DECANTER FOR FLEXI CONT (MISCELLANEOUS) ×3 IMPLANT
BAND RUBBER #18 3X1/16 STRL (MISCELLANEOUS) ×6 IMPLANT
BENZOIN TINCTURE PRP APPL 2/3 (GAUZE/BANDAGES/DRESSINGS) IMPLANT
BLADE CLIPPER SURG (BLADE) IMPLANT
BLADE SURG 11 STRL SS (BLADE) ×3 IMPLANT
BLADE ULTRA TIP 2M (BLADE) IMPLANT
BUR MATCHSTICK NEURO 3.0 LAGG (BURR) ×6 IMPLANT
CAGE ENDOSKELETON TC 14X12X5 (Cage) ×3 IMPLANT
CAGE LORDOTIC 6 SM (Cage) ×6 IMPLANT
CAGE LORDOTIC 6MM SM (Cage) ×3 IMPLANT
CANISTER SUCT 3000ML PPV (MISCELLANEOUS) ×3 IMPLANT
CARTRIDGE OIL MAESTRO DRILL (MISCELLANEOUS) ×1 IMPLANT
CLOSURE WOUND 1/2 X4 (GAUZE/BANDAGES/DRESSINGS)
COVER WAND RF STERILE (DRAPES) IMPLANT
DECANTER SPIKE VIAL GLASS SM (MISCELLANEOUS) ×3 IMPLANT
DERMABOND ADVANCED (GAUZE/BANDAGES/DRESSINGS) ×2
DERMABOND ADVANCED .7 DNX12 (GAUZE/BANDAGES/DRESSINGS) ×1 IMPLANT
DIFFUSER DRILL AIR PNEUMATIC (MISCELLANEOUS) ×3 IMPLANT
DRAIN CHANNEL 10M FLAT 3/4 FLT (DRAIN) IMPLANT
DRAPE C-ARM 42X72 X-RAY (DRAPES) ×6 IMPLANT
DRAPE HALF SHEET 40X57 (DRAPES) ×3 IMPLANT
DRAPE LAPAROTOMY 100X72 PEDS (DRAPES) ×3 IMPLANT
DRAPE MICROSCOPE LEICA (MISCELLANEOUS) ×3 IMPLANT
DRSG OPSITE 4X5.5 SM (GAUZE/BANDAGES/DRESSINGS) IMPLANT
DRSG OPSITE POSTOP 3X4 (GAUZE/BANDAGES/DRESSINGS) IMPLANT
DRSG OPSITE POSTOP 4X6 (GAUZE/BANDAGES/DRESSINGS) ×3 IMPLANT
DURAPREP 6ML APPLICATOR 50/CS (WOUND CARE) ×3 IMPLANT
ELECT COATED BLADE 2.86 ST (ELECTRODE) ×6 IMPLANT
ELECT REM PT RETURN 9FT ADLT (ELECTROSURGICAL) ×3
ELECTRODE REM PT RTRN 9FT ADLT (ELECTROSURGICAL) ×1 IMPLANT
EVACUATOR SILICONE 100CC (DRAIN) IMPLANT
GAUZE 4X4 16PLY RFD (DISPOSABLE) IMPLANT
GLOVE BIO SURGEON STRL SZ7.5 (GLOVE) ×6 IMPLANT
GLOVE BIOGEL PI IND STRL 7.5 (GLOVE) ×4 IMPLANT
GLOVE BIOGEL PI INDICATOR 7.5 (GLOVE) ×8
GLOVE ECLIPSE 7.0 STRL STRAW (GLOVE) ×6 IMPLANT
GLOVE EXAM NITRILE XL STR (GLOVE) ×3 IMPLANT
GLOVE SURG SS PI 7.0 STRL IVOR (GLOVE) ×12 IMPLANT
GOWN STRL REUS W/ TWL LRG LVL3 (GOWN DISPOSABLE) ×3 IMPLANT
GOWN STRL REUS W/ TWL XL LVL3 (GOWN DISPOSABLE) IMPLANT
GOWN STRL REUS W/TWL 2XL LVL3 (GOWN DISPOSABLE) IMPLANT
GOWN STRL REUS W/TWL LRG LVL3 (GOWN DISPOSABLE) ×6
GOWN STRL REUS W/TWL XL LVL3 (GOWN DISPOSABLE)
HEMOSTAT POWDER KIT SURGIFOAM (HEMOSTASIS) ×6 IMPLANT
KIT BASIN OR (CUSTOM PROCEDURE TRAY) ×3 IMPLANT
KIT TURNOVER KIT B (KITS) ×3 IMPLANT
NEEDLE HYPO 22GX1.5 SAFETY (NEEDLE) ×3 IMPLANT
NEEDLE SPNL 22GX3.5 QUINCKE BK (NEEDLE) ×3 IMPLANT
NS IRRIG 1000ML POUR BTL (IV SOLUTION) ×3 IMPLANT
OIL CARTRIDGE MAESTRO DRILL (MISCELLANEOUS) ×3
PACK LAMINECTOMY NEURO (CUSTOM PROCEDURE TRAY) ×3 IMPLANT
PAD ARMBOARD 7.5X6 YLW CONV (MISCELLANEOUS) ×12 IMPLANT
PIN DISTRACTION 14MM (PIN) ×6 IMPLANT
PLATE ANT CERV ATL 75 (Plate) ×3 IMPLANT
PUTTY DBF 3CC CORTICAL FIBERS (Putty) ×3 IMPLANT
SCREW RESCUE 13MM (Screw) ×15 IMPLANT
SCREW SELF TAP VAR 4.0X13 (Screw) ×18 IMPLANT
SPONGE INTESTINAL PEANUT (DISPOSABLE) ×3 IMPLANT
SPONGE SURGIFOAM ABS GEL 100 (HEMOSTASIS) ×3 IMPLANT
STRIP CLOSURE SKIN 1/2X4 (GAUZE/BANDAGES/DRESSINGS) IMPLANT
SUT ETHILON 3 0 FSL (SUTURE) IMPLANT
SUT VIC AB 3-0 SH 8-18 (SUTURE) ×6 IMPLANT
SUT VICRYL 3-0 RB1 18 ABS (SUTURE) ×6 IMPLANT
TAPE CLOTH 3X10 TAN LF (GAUZE/BANDAGES/DRESSINGS) ×3 IMPLANT
TOWEL GREEN STERILE (TOWEL DISPOSABLE) ×3 IMPLANT
TOWEL GREEN STERILE FF (TOWEL DISPOSABLE) ×3 IMPLANT
WATER STERILE IRR 1000ML POUR (IV SOLUTION) ×3 IMPLANT

## 2019-08-05 NOTE — Op Note (Signed)
NEUROSURGERY OPERATIVE NOTE   PREOP DIAGNOSIS: Cervical disc disease with myelopathy, C3-4, C4-5, C5-6, C6-7  POSTOP DIAGNOSIS: Same  PROCEDURE: 1. Discectomy at C3-4, C4-5, C5-6, C6-7 for decompression of spinal cord and exiting nerve roots  2. Placement of intervertebral biomechanical device:  Medtronic Titan 83mm lordotic @ C3-4  Medtronic Titan 66mm lordotic @ C4-5  Medtronic Titan 21mm lordotic @ C5-6  Medtronic Titan 47mm lordotic @ C6-7  3. Placement of anterior instrumentation consisting of interbody plate and screws - Medtronic Atlantis 65mm plate  4. Use of morselized bone allograft  5. Arthrodesis C3-4, C4-5, C5-6, C6-7, anterior interbody technique  6. Use of intraoperative microscope  SURGEON: Dr. Lisbeth Renshaw, MD  ASSISTANT: Cindra Presume, PA-C  ANESTHESIA: General Endotracheal  EBL: 100cc  SPECIMENS: None  DRAINS: None  COMPLICATIONS: None immediate  CONDITION: Hemodynamically stable to PACU  HISTORY: Brian Daniels is a 64 y.o. y.o. male who initially presented to the outpatient clinic with signs and symptoms consistent with cervical myelopathy including bilateral upper extremity weakness and paresthesias, left greater than right, and gait instability.  Symptoms were rapidly progressive to the point where he was wheelchair-bound preoperatively.  Imaging did demonstrate significant cervical disc disease including cervical stenosis with significant spinal cord compression and T2 signal change within the cord worst at C3-4, C5-6, C6-7 and to a lesser extent at C4-5. Treatment options were discussed including my recommendation for surgical decompression and fusion.  The risks and benefits of the surgery as well as the expected postoperative course and likelihood of achieving goals of surgery were all discussed in detail with both the patient and his sister. After all questions were answered, informed consent was obtained.  PROCEDURE IN DETAIL: The patient was  brought to the operating room and transferred to the operative table. After induction of general anesthesia, the patient was positioned on the operative table in the supine position with all pressure points meticulously padded. The skin of the neck was then prepped and draped in the usual sterile fashion.  After timeout was conducted, the skin was infiltrated with local anesthetic. Skin incision was then made sharply and Bovie electrocautery was used to dissect the subcutaneous tissue until the platysma was identified. The platysma was then divided and undermined. The sternocleidomastoid muscle was then identified and, utilizing natural fascial planes in the neck, the prevertebral fascia was identified and the carotid sheath was retracted laterally and the trachea and esophagus retracted medially.  Spinal needle was introduced into the C5-6 interspace and intraoperative lateral fluoroscopic image confirmed our location at this level.  Bovie electrocautery was used to dissect in the subperiosteal plane and elevate the bilateral longus coli muscles.  Dissection was carried both superiorly and inferiorly to identify the C3-4, C4-5 and C6-7 disc spaces as well.  Table mounted retractor was then placed. At this point, the microscope was draped and brought into the field, and the remainder of the case was done under the microscope using microdissecting technique.  The C3-4 disc space was incised sharply and rongeurs were use to initially complete a discectomy. The high-speed drill was then used to complete discectomy until the posterior annulus was identified and removed and the posterior longitudinal ligament was identified. Using a nerve hook, the PLL was elevated, and Kerrison rongeurs were used to remove the posterior longitudinal ligament and the ventral thecal sac was identified. Using a combination of curettes and rongeurs, complete decompression of the thecal sac and exiting nerve roots at this level was  completed, and  verified using micro-nerve hook.  A 6 mm lordotic graft was then packed with morselized bone allograft and tapped into place after endplates were prepared with curettes.  In a similar fashion, the C4-5 disc space was identified and incised.  Superficial discectomy was completed using curettes.  High-speed drill was used to complete the discectomy until the PLL was identified.  This was elevated and removed piecemeal with Kerrison punches.  This allowed good decompression of the thecal sac ventrally including the proximal portion of the nerve roots bilaterally.  This was confirmed by easy passage of a ball-tipped dissector within the ventral epidural space.  6 mm lordotic graft was then packed with morselized bone allograft and tapped into place after endplates were prepared with curettes.  Attention was then turned to the C5-6 disc space which was removed in the similar fashion.  This disc base was noted to be significantly collapsed, with a very large anterior osteophyte from the inferior margin of the C5 vertebral body.  Initially, the large anterior osteophyte was removed with rongeurs.  The disc space was then entered and superficial discectomy was completed.  High-speed drill was used to complete the discectomy.  Again, the PLL was identified and removed piecemeal.  This provided good decompression of the thecal sac and exiting nerve roots. 5 mm lordotic graft was then packed with morselized bone allograft and tapped into place after endplates were prepared with curettes.  At the C6-7 disc space, again noted was a large anterior osteophyte from the C6 vertebral body.  This was drilled down.  The disc space was noted to be severely collapsed.  I therefore elected to place a Paramedic.  This allowed distraction of the disc space to complete the discectomy with a high-speed drill.  The posterior longitudinal ligament was again identified and removed piecemeal with Stann Mainland.  Again, this  allowed good decompression of the thecal sac and exiting nerve roots.  Endplates were then prepared with curettes.  6 mm graft was then packed with morselized bone allograft and tapped into place.  The Caspar traction was then reduced and the pins removed.  I did note some bleeding from the superior thyroid artery at the inferior margin of the field which was easily controlled with bipolar electrocautery.  At this point the above plate was then placed over the vertebral bodies and lateral fluoroscopic image confirmed correct sizing.  The plate was then removed and the pre-bent lordosis was removed in order to make the plate more straight to fit the patient's anatomy.  The plate was then replaced over the cervical vertebral bodies, and screws were inserted initially in C7 and C3, followed by C4, C6, and finally C5.  Final lateral fluoroscopic image demonstrated good position of implanted hardware.  It did note relatively straight alignment of the cervical spine but significant improvement from the preoperative kyphosis.  At this point, after all counts were verified to be correct, meticulous hemostasis was secured using a combination of bipolar electrocautery and passive hemostatics. The platysma muscle was then closed using interrupted 3-0 Vicryl sutures, and the skin was closed with an interrupted 3-0 Vicry subcuticular stitch. Dermabond and sterile dressings were then applied and the drapes removed.  The patient tolerated the procedure well and was extubated in the room and taken to the postanesthesia care unit in stable condition.

## 2019-08-05 NOTE — Anesthesia Postprocedure Evaluation (Signed)
Anesthesia Post Note  Patient: Brian Daniels  Procedure(s) Performed: ANTERIOR CERVICAL DECOMPRESSION/DISCECTOMY FUSION CERVICAL THREE- CERVICAL FOUR, CERVICAL FOUR- CERVICAL FIVE, CERVICAL FIVE- CERVICAL SIX, CERVICAL SIX- CERVICAL SEVEN (N/A Spine Cervical)     Patient location during evaluation: PACU Anesthesia Type: General Level of consciousness: sedated Pain management: pain level controlled Vital Signs Assessment: post-procedure vital signs reviewed and stable Respiratory status: spontaneous breathing and respiratory function stable Cardiovascular status: stable Postop Assessment: no apparent nausea or vomiting Anesthetic complications: no    Last Vitals:  Vitals:   08/05/19 1345 08/05/19 1400  BP: 136/71 133/70  Pulse: 65 71  Resp: 11 10  Temp:    SpO2: 100% 100%    Last Pain:  Vitals:   08/05/19 1400  TempSrc:   PainSc: Asleep                 Cassidee Deats DANIEL

## 2019-08-05 NOTE — Transfer of Care (Signed)
Immediate Anesthesia Transfer of Care Note  Patient: Brian Daniels  Procedure(s) Performed: ANTERIOR CERVICAL DECOMPRESSION/DISCECTOMY FUSION CERVICAL THREE- CERVICAL FOUR, CERVICAL FOUR- CERVICAL FIVE, CERVICAL FIVE- CERVICAL SIX, CERVICAL SIX- CERVICAL SEVEN (N/A Spine Cervical)  Patient Location: PACU  Anesthesia Type:General  Level of Consciousness: awake, alert  and oriented  Airway & Oxygen Therapy: Patient Spontanous Breathing  Post-op Assessment: Report given to RN, Post -op Vital signs reviewed and stable and Patient moving all extremities X 4  Post vital signs: Reviewed and stable  Last Vitals:  Vitals Value Taken Time  BP 152/75 08/05/19 1230  Temp    Pulse 64 08/05/19 1231  Resp 18 08/05/19 1231  SpO2 98 % 08/05/19 1231  Vitals shown include unvalidated device data.  Last Pain:  Vitals:   08/05/19 0620  TempSrc: Oral  PainSc: 4          Complications: No apparent anesthesia complications

## 2019-08-05 NOTE — Progress Notes (Signed)
Pt arrived on floor, transferred from PACU.  Patient is A&OX4 and in no acute distress. Pt states pain is 3/10, with no complaints of nausea. Anterior neck incision intact with honeycomb dressing c/d/i, aspen collar remains in place, heels and buttocks are intact.   Patient orienated to room and unit. Bedside table and personal belongings within reach of patient. Patient educated on usage of nurse call bell, placed within reach of patient. Patient instructed to call for assistance. Patient in bed. Will continue to monitor.   Report received from [facility], PACU RN.

## 2019-08-05 NOTE — Progress Notes (Signed)
Orthopedic Tech Progress Note Patient Details:  Brian Daniels 1955-10-17 [id number] Patient has on collar Patient ID: Jacier Gladu, male   DOB: 07/20/1955, 64 y.o.   MRN: [id number]   Donald Pore 08/05/2019, 4:47 PM

## 2019-08-05 NOTE — Anesthesia Procedure Notes (Signed)
Procedure Name: Intubation Date/Time: 08/05/2019 8:07 AM Performed by: Nils Pyle, CRNA Pre-anesthesia Checklist: Patient identified, Emergency Drugs available, Suction available and Patient being monitored Patient Re-evaluated:Patient Re-evaluated prior to induction Oxygen Delivery Method: Circle System Utilized Preoxygenation: Pre-oxygenation with 100% oxygen Induction Type: IV induction Ventilation: Mask ventilation without difficulty Laryngoscope Size: Glidescope and 4 Grade View: Grade I Tube type: Oral Tube size: 7.5 mm Number of attempts: 1 Airway Equipment and Method: Stylet and Oral airway Placement Confirmation: ETT inserted through vocal cords under direct vision,  positive ETCO2 and breath sounds checked- equal and bilateral Secured at: 23 cm Tube secured with: Tape Dental Injury: Teeth and Oropharynx as per pre-operative assessment

## 2019-08-06 LAB — BASIC METABOLIC PANEL
Anion gap: 12 (ref 5–15)
BUN: 19 mg/dL (ref 8–23)
CO2: 23 mmol/L (ref 22–32)
Calcium: 9.2 mg/dL (ref 8.9–10.3)
Chloride: 102 mmol/L (ref 98–111)
Creatinine, Ser: 1.06 mg/dL (ref 0.61–1.24)
GFR calc Af Amer: >60 mL/min (ref >60)
GFR calc non Af Amer: >60 mL/min (ref >60)
Glucose, Bld: 128 mg/dL — ABNORMAL HIGH (ref 70–99)
Potassium: 4.3 mmol/L (ref 3.5–5.1)
Sodium: 137 mmol/L (ref 135–145)

## 2019-08-06 LAB — GLUCOSE, CAPILLARY
Glucose-Capillary: 92 mg/dL (ref 70–99)
Glucose-Capillary: 132 mg/dL — ABNORMAL HIGH (ref 70–99)
Glucose-Capillary: 123 mg/dL — ABNORMAL HIGH (ref 70–99)
Glucose-Capillary: 150 mg/dL — ABNORMAL HIGH (ref 70–99)

## 2019-08-06 LAB — CBC
HCT: 39.6 % (ref 39.0–52.0)
Hemoglobin: 12.9 g/dL — ABNORMAL LOW (ref 13.0–17.0)
MCH: 26.9 pg (ref 26.0–34.0)
MCHC: 32.6 g/dL (ref 30.0–36.0)
MCV: 82.5 fL (ref 80.0–100.0)
Platelets: 152 10*3/uL (ref 150–400)
RBC: 4.8 MIL/uL (ref 4.22–5.81)
RDW: 12.6 % (ref 11.5–15.5)
WBC: 14.2 10*3/uL — ABNORMAL HIGH (ref 4.0–10.5)
nRBC: 0 % (ref 0.0–0.2)

## 2019-08-06 LAB — PROTIME-INR
INR: 1.2 (ref 0.8–1.2)
Prothrombin Time: 14.7 seconds (ref 11.4–15.2)

## 2019-08-06 LAB — APTT: aPTT: 27 seconds (ref 24–36)

## 2019-08-06 NOTE — Progress Notes (Addendum)
   08/06/19 1751  Output (mL)  Urine 225 mL  Urine Characteristics  Post Void Cath Residual (mL) 368 mL   PVR with bladder scan shown above after pt voided spontaneously. Notified , NP Meyran. I & O cath performed under sterile technique with yellow, clear urine output. Will notify oncoming RN of urinary issues and continue to monitor.

## 2019-08-06 NOTE — Evaluation (Signed)
Physical Therapy Evaluation Patient Details Name: Brian Daniels MRN: [id number] DOB: 1955/09/19 Today's Date: 08/06/2019   History of Present Illness  64 y.o. male who was found to have multilevel mod-severe cervical stenosis with myelopathy (primarily left-sided numbness and tingling and weakness) and w/c bound since 05/30/19 pt fell and weakness started (slipped while walking). MRI also showed signal change in spinal cord at C3-4 level. 08/05/19 underwent ACDF C3-7.   PMH-lumbar pain, DM, bil glaucoma legally blind, HTN,  Clinical Impression   Patient is s/p above surgery resulting in functional limitations due to the deficits listed below (see PT Problem List). Patient has been wheelchair bound at home with sister's assistance since 05/30/19 fall with resulting myelopathy from severe cervical stenosis. Prior to this he was fully independent. He has noted improvement in strength s/p surgery/decompression, although also is having increased tone which further impairs his mobility. He required 2 person mod-max assist for bed mobility and OOB to chair, but was able to use a walker (with assist). He was initially resistant to the idea of rehab prior to home, but expressed he was worried about exposure to COVID. Explained in detail the amount of therapy he would receive at a CIR vs SNF vs HH. He agreed to pursue CIR prior to discharge home. He is an excellent candidate for intensive therapy to maximize his functional return, independence and safety. Patient will benefit from skilled PT to increase their independence and safety with mobility to allow discharge to the venue listed below.       Follow Up Recommendations CIR;Supervision/Assistance - 24 hour    Equipment Recommendations  Rolling walker with 5" wheels    Recommendations for Other Services       Precautions / Restrictions Precautions Precautions: Fall Required Braces or Orthoses: Cervical Brace Cervical Brace: Hard collar;Other  (comment)(apply in sitting) Restrictions Weight Bearing Restrictions: No      Mobility  Bed Mobility Overal bed mobility: Needs Assistance Bed Mobility: Rolling;Sidelying to Sit Rolling: Mod assist Sidelying to sit: +2 for physical assistance;+2 for safety/equipment;Max assist       General bed mobility comments: HOB 0; assist to bend legs prior to rolling, assist to take legs off EOB; incr assist to raise torso to sitting with limited use of UEs and ?disoriented to where vertical is (leaning posterior and then to his right)  Transfers Overall transfer level: Needs assistance Equipment used: Rolling walker (2 wheeled) Transfers: Sit to/from Stand Sit to Stand: Mod assist;+2 physical assistance;+2 safety/equipment;From elevated surface         General transfer comment: bed elevated ~4"; Lt hand assisted to hold RW; RUE push off bed  Ambulation/Gait Ambulation/Gait assistance: Mod assist;+2 safety/equipment Gait Distance (Feet): 2 Feet Assistive device: Rolling walker (2 wheeled) Gait Pattern/deviations: Step-to pattern     General Gait Details: forward and then pivotal steps to chair; assist to hold Lt hand on RW; max verbal cues due to blindness; assist for balance as feet places too close together at times  Stairs            Wheelchair Mobility    Modified Rankin (Stroke Patients Only)       Balance Overall balance assessment: Needs assistance Sitting-balance support: Bilateral upper extremity supported;Feet supported Sitting balance-Leahy Scale: Poor   Postural control: Right lateral lean;Posterior lean Standing balance support: Bilateral upper extremity supported Standing balance-Leahy Scale: Poor  Pertinent Vitals/Pain Pain Assessment: 0-10 Pain Score: 3  Pain Location: neck Pain Descriptors / Indicators: Guarding;Spasm Pain Intervention(s): Limited activity within patient's tolerance;Monitored during  session;Repositioned    Home Living Family/patient expects to be discharged to:: Private residence Living Arrangements: Other relatives(sister) Available Help at Discharge: Family;Available 24 hours/day Type of Home: House Home Access: Stairs to enter Entrance Stairs-Rails: None Entrance Stairs-Number of Steps: 1(short, built on slab) Home Layout: One level Home Equipment: Wheelchair - manual      Prior Function Level of Independence: Needs assistance   Gait / Transfers Assistance Needed: transfer with sister's assist (sounds like squat-pivot over armrest); prior to 11/16 was walking; w/c bound with sister pushing him since 11/16           Hand Dominance   Dominant Hand: Left    Extremity/Trunk Assessment   Upper Extremity Assessment Upper Extremity Assessment: Defer to OT evaluation    Lower Extremity Assessment Lower Extremity Assessment: RLE deficits/detail;LLE deficits/detail RLE Deficits / Details: incr flexor tone, but able to override to come to standing; knee extension grossly 3+ RLE Sensation: decreased light touch RLE Coordination: decreased gross motor LLE Deficits / Details: weaker than rt; also with incr tone; knee extension grossly 3- LLE Sensation: decreased light touch LLE Coordination: decreased gross motor       Communication   Communication: No difficulties  Cognition Arousal/Alertness: Awake/alert Behavior During Therapy: WFL for tasks assessed/performed Overall Cognitive Status: Within Functional Limits for tasks assessed                                        General Comments General comments (skin integrity, edema, etc.): Incr time for all activities due to max cues due to blindness and allowing pt to attempt tasks for himself (ie. donning his sunglasses, donning his brace)    Exercises     Assessment/Plan    PT Assessment Patient needs continued PT services  PT Problem List Decreased strength;Decreased  balance;Decreased mobility;Decreased coordination;Decreased knowledge of use of DME;Decreased knowledge of precautions;Impaired sensation;Impaired tone;Pain       PT Treatment Interventions DME instruction;Gait training;Functional mobility training;Therapeutic activities;Balance training;Neuromuscular re-education;Patient/family education;Wheelchair mobility training    PT Goals (Current goals can be found in the Care Plan section)  Acute Rehab PT Goals Patient Stated Goal: regain as much strength and function as he can PT Goal Formulation: With patient Time For Goal Achievement: 08/20/19 Potential to Achieve Goals: Good    Frequency Min 5X/week   Barriers to discharge        Co-evaluation PT/OT/SLP Co-Evaluation/Treatment: Yes Reason for Co-Treatment: Complexity of the patient's impairments (multi-system involvement);For patient/therapist safety;To address functional/ADL transfers PT goals addressed during session: Mobility/safety with mobility;Balance;Proper use of DME         AM-PAC PT "6 Clicks" Mobility  Outcome Measure Help needed turning from your back to your side while in a flat bed without using bedrails?: A Lot Help needed moving from lying on your back to sitting on the side of a flat bed without using bedrails?: A Lot Help needed moving to and from a bed to a chair (including a wheelchair)?: A Lot Help needed standing up from a chair using your arms (e.g., wheelchair or bedside chair)?: A Lot Help needed to walk in hospital room?: Total Help needed climbing 3-5 steps with a railing? : Total 6 Click Score: 10    End of Session Equipment  Utilized During Treatment: Gait belt;Cervical collar Activity Tolerance: Patient tolerated treatment well Patient left: in chair;with call bell/phone within reach;with chair alarm set;with nursing/sitter in room Nurse Communication: Mobility status;Other (comment) PT Visit Diagnosis: Other abnormalities of gait and mobility  (R26.89);Muscle weakness (generalized) (M62.81);Other symptoms and signs involving the nervous system (T26.712)    Time: 4580-9983 PT Time Calculation (min) (ACUTE ONLY): 46 min   Charges:   PT Evaluation $PT Eval Moderate Complexity: 1 Mod PT Treatments $Therapeutic Activity: 8-22 mins         Arby Barrette, PT Pager [phone]   Rexanne Mano 08/06/2019, 10:11 AM

## 2019-08-06 NOTE — Plan of Care (Signed)
  Problem: Elimination: Goal: Will not experience complications related to bowel motility Note: Pt received scheduled stool softener and laxative this morning, reports hx of constipation. Denied wanting additional PRN bowel regimen medications at this time. Will continue to assess bowel pattern. Goal: Will not experience complications related to urinary retention Note: Bladder scan performed post foley catheter removal this morning. Bladder scan revealed , pt denies have urge to void of feeling full/discomfort at this time. Per urinary catheter removal protocol will monitor urinary output for 2 more hours, then re-scan.

## 2019-08-06 NOTE — Evaluation (Signed)
Occupational Therapy Evaluation Patient Details Name: Brian Daniels MRN: [id number] DOB: 1956-01-16 Today's Date: 08/06/2019    History of Present Illness 64 y.o. male who was found to have multilevel mod-severe cervical stenosis with myelopathy (primarily left-sided numbness and tingling and weakness) and w/c bound since 05/30/19 pt fell and weakness started (slipped while walking). MRI also showed signal change in spinal cord at C3-4 level. 08/05/19 underwent ACDF C3-7.   PMH-lumbar pain, DM, bil glaucoma legally blind, HTN,   Clinical Impression   Patient is s/p see above surgery resulting in the deficits listed below (see OT Problem List). Patient at home was transferring from surface to wheelchair with sister completing wheelchair mobility as patient was loosing grip strength. Patient has been declining since a fall in November. Patient today presented with decrease awareness of precautions and due to decrease strength and vision unable to don/doff cervical collar.  Patient required 2 person mod-max assist for bed mobility and OOB to chair, but was able to use a walker (with assist). Patient when sitting EOB required moderate to CGA as was unaware how they were sitting.  Patient will benefit from skilled OT to increase their safety and independence with ADL and functional mobility for ADL (while adhering to their precautions) to facilitate discharge to venue listed below.       Follow Up Recommendations  CIR;Supervision/Assistance - 24 hour    Equipment Recommendations       Recommendations for Other Services       Precautions / Restrictions Precautions Precautions: Fall Required Braces or Orthoses: Cervical Brace Cervical Brace: Hard collar;Other (comment) Restrictions Weight Bearing Restrictions: No      Mobility Bed Mobility Overal bed mobility: Needs Assistance Bed Mobility: Rolling;Sidelying to Sit Rolling: Mod assist Sidelying to sit: +2 for physical assistance;+2 for  safety/equipment;Max assist       General bed mobility comments: HOB 0; assist to bend legs prior to rolling, assist to take legs off EOB; incr assist to raise torso to sitting with limited use of UEs and ?disoriented to where vertical is (leaning posterior and then to his right)  Transfers Overall transfer level: Needs assistance Equipment used: Rolling walker (2 wheeled) Transfers: Sit to/from Stand Sit to Stand: Mod assist;+2 physical assistance;+2 safety/equipment;From elevated surface         General transfer comment: bed elevated ~4"; Lt hand assisted to hold RW; RUE push off bed    Balance Overall balance assessment: Needs assistance Sitting-balance support: Bilateral upper extremity supported;Feet supported Sitting balance-Leahy Scale: Poor   Postural control: Right lateral lean;Posterior lean Standing balance support: Bilateral upper extremity supported Standing balance-Leahy Scale: Poor                             ADL either performed or assessed with clinical judgement   ADL Overall ADL's : Needs assistance/impaired Eating/Feeding: Set up;Sitting   Grooming: Wash/dry face;Minimal assistance;Sitting   Upper Body Bathing: Moderate assistance;Cueing for safety;Cueing for sequencing;Sitting   Lower Body Bathing: Maximal assistance;Sit to/from stand;Cueing for safety;Cueing for sequencing   Upper Body Dressing : Moderate assistance;Cueing for safety;Cueing for sequencing;Sitting   Lower Body Dressing: Maximal assistance;Sit to/from stand   Toilet Transfer: Maximal assistance;+2 for physical assistance;+2 for safety/equipment;Cueing for safety   Toileting- Clothing Manipulation and Hygiene: Maximal assistance;Cueing for safety;Cueing for sequencing   Tub/ Shower Transfer: +2 for physical assistance;+2 for safety/equipment;Moderate assistance   Functional mobility during ADLs: Rolling walker;Maximal assistance  Vision Baseline Vision/History:  Legally blind Additional Comments: legally blind     Perception Perception Perception Tested?: No   Praxis Praxis Praxis tested?: Not tested    Pertinent Vitals/Pain Pain Assessment: 0-10 Pain Score: 3  Pain Location: neck Pain Descriptors / Indicators: Guarding;Spasm Pain Intervention(s): Limited activity within patient's tolerance     Hand Dominance Left   Extremity/Trunk Assessment Upper Extremity Assessment Upper Extremity Assessment: RUE deficits/detail;LUE deficits/detail RUE Deficits / Details: generalize weakness RUE Sensation: decreased proprioception RUE Coordination: decreased fine motor LUE Deficits / Details: general weakness LUE Sensation: decreased proprioception LUE Coordination: decreased fine motor   Lower Extremity Assessment Lower Extremity Assessment: Defer to PT evaluation RLE Deficits / Details: incr flexor tone, but able to override to come to standing; knee extension grossly 3+ RLE Sensation: decreased light touch RLE Coordination: decreased gross motor LLE Deficits / Details: weaker than rt; also with incr tone; knee extension grossly 3- LLE Sensation: decreased light touch LLE Coordination: decreased gross motor   Cervical / Trunk Assessment Cervical / Trunk Assessment: Other exceptions Cervical / Trunk Exceptions: s/p sx   Communication Communication Communication: No difficulties   Cognition Arousal/Alertness: Awake/alert Behavior During Therapy: WFL for tasks assessed/performed Overall Cognitive Status: Within Functional Limits for tasks assessed                                     General Comments  increase time and max cues    Exercises     Shoulder Instructions      Home Living Family/patient expects to be discharged to:: Private residence Living Arrangements: Other relatives Available Help at Discharge: Family;Available 24 hours/day Type of Home: House Home Access: Stairs to enter [employer]  of Steps: 1 Entrance Stairs-Rails: None Home Layout: One level     Bathroom Shower/Tub: Chief Strategy Officer: Handicapped height Bathroom Accessibility: Yes How Accessible: Accessible via wheelchair Home Equipment: Wheelchair - manual          Prior Functioning/Environment Level of Independence: Needs assistance  Gait / Transfers Assistance Needed: transfer with sister's assist (sounds like squat-pivot over armrest); prior to 11/16 was walking; w/c bound with sister pushing him since 11/16 ADL's / Homemaking Assistance Needed: sister assist with ADLs but has progress since fall and IADLS she completed            OT Problem List: Decreased strength;Decreased range of motion;Decreased activity tolerance;Impaired balance (sitting and/or standing);Decreased safety awareness;Decreased knowledge of use of DME or AE;Pain      OT Treatment/Interventions: Self-care/ADL training;Therapeutic exercise;DME and/or AE instruction;Therapeutic activities;Patient/family education;Visual/perceptual remediation/compensation;Balance training    OT Goals(Current goals can be found in the care plan section) Acute Rehab OT Goals Patient Stated Goal: regain as much strength and function as he can OT Goal Formulation: With patient Time For Goal Achievement: 08/20/19 Potential to Achieve Goals: Good ADL Goals Pt Will Perform Grooming: with supervision;sitting Pt Will Perform Upper Body Dressing: with min assist;sitting Pt Will Transfer to Toilet: with min guard assist;ambulating;regular height toilet  OT Frequency: Min 2X/week   Barriers to D/C:            Co-evaluation PT/OT/SLP Co-Evaluation/Treatment: Yes Reason for Co-Treatment: Complexity of the patient's impairments (multi-system involvement) PT goals addressed during session: Mobility/safety with mobility;Balance;Proper use of DME OT goals addressed during session: ADL's and self-care;Strengthening/ROM      AM-PAC OT "6  Clicks" Daily Activity  Outcome Measure Help from another person eating meals?: A Little Help from another person taking care of personal grooming?: A Little Help from another person toileting, which includes using toliet, bedpan, or urinal?: A Lot Help from another person bathing (including washing, rinsing, drying)?: A Lot Help from another person to put on and taking off regular upper body clothing?: A Lot Help from another person to put on and taking off regular lower body clothing?: A Lot 6 Click Score: 14   End of Session Equipment Utilized During Treatment: Gait belt;Rolling walker;Cervical collar Nurse Communication: Mobility status  Activity Tolerance: Patient tolerated treatment well Patient left: in chair;with chair alarm set  OT Visit Diagnosis: Unsteadiness on feet (R26.81);Muscle weakness (generalized) (M62.81);History of falling (Z91.81);Pain Pain - part of body: (nack)                Time: 0932-3557 OT Time Calculation (min): 47 min Charges:  OT General Charges $OT Visit: 1 Visit OT Evaluation $OT Eval Low Complexity: [place] OTR/L  Acute Rehab Services  [phone] office number [phone] pager number   Joeseph Amor 08/06/2019, 10:28 AM

## 2019-08-06 NOTE — Progress Notes (Signed)
Patient ID: Brian Daniels, male   DOB: [birth year], 64 y.o.   MRN: [id number] Patient doing well reports improved sensation arms and hands  Afebrile stable vital signs swallowing is manageable moves all extremities well incision clean dry and intact  Mobilize today with occupational therapy possible discharge later today more likely tomorrow morning.

## 2019-08-06 NOTE — Progress Notes (Addendum)
Rehab Admissions Coordinator Note:  Patient was screened by Stephania Fragmin for appropriateness for an Inpatient Acute Rehab Consult. Per therapy documentation, pt w/c bound since ?rapid onset of LE weakness in November.  Will recommend CIR consult to thoroughly assess pt, including prior level of function, to see if he may be a candidate for CIR.   Stephania Fragmin 08/06/2019, 12:01 PM  I can be reached at [phone].

## 2019-08-07 LAB — GLUCOSE, CAPILLARY
Glucose-Capillary: 78 mg/dL (ref 70–99)
Glucose-Capillary: 108 mg/dL — ABNORMAL HIGH (ref 70–99)
Glucose-Capillary: 110 mg/dL — ABNORMAL HIGH (ref 70–99)
Glucose-Capillary: 111 mg/dL — ABNORMAL HIGH (ref 70–99)

## 2019-08-07 NOTE — Progress Notes (Signed)
Subjective: Patient reports some neck pain and left arm numbness which is not new  Objective: Vital signs in last 24 hours: Temp:  [97.6 F (36.4 C)-98.7 F (37.1 C)] 98.7 F (37.1 C) (01/24 0320) Pulse Rate:  [58-77] 75 (01/24 0320) Resp:  [16-20] 18 (01/24 0320) BP: (143-158)/(67-89) 158/80 (01/24 0320) SpO2:  [98 %-100 %] 98 % (01/24 0320)  Intake/Output from previous day: 01/23 0701 - 01/24 0700 In: 38 [I.V.:38] Out: 2393 [Urine:2393] Intake/Output this shift: No intake/output data recorded.  Neurologic: Grossly normal  Lab Results: Lab Results  Component Value Date   WBC 14.2 (H) 08/06/2019   HGB 12.9 (L) 08/06/2019   HCT 39.6 08/06/2019   MCV 82.5 08/06/2019   PLT 152 08/06/2019   Lab Results  Component Value Date   INR 1.2 08/06/2019   BMET Lab Results  Component Value Date   NA 137 08/06/2019   K 4.3 08/06/2019   CL 102 08/06/2019   CO2 23 08/06/2019   GLUCOSE 128 (H) 08/06/2019   BUN 19 08/06/2019   CREATININE 1.06 08/06/2019   CALCIUM 9.2 08/06/2019    Studies/Results: DG Cervical Spine 1 View  Result Date: 08/05/2019 CLINICAL DATA:  Cervical fusion EXAM: DG CERVICAL SPINE - 1 VIEW; DG C-ARM 1-60 MIN COMPARISON:  06/23/2019 FLUOROSCOPY TIME:  Fluoroscopy Time:  4 seconds Radiation Exposure Index (if provided by the fluoroscopic device): Not available Number of Acquired Spot Images: 1 FINDINGS: Single lateral spot film was obtained of the cervical spine. Interbody fusion is noted at C3-4, C4-5, C5-6 and C6-7. Anterior fixation plate is noted in satisfactory position. IMPRESSION: Status post cervical fusion from C3-C7. Electronically Signed   By: Alcide Clever M.D.   On: 08/05/2019 12:13   DG C-Arm 1-60 Min  Result Date: 08/05/2019 CLINICAL DATA:  Cervical fusion EXAM: DG CERVICAL SPINE - 1 VIEW; DG C-ARM 1-60 MIN COMPARISON:  06/23/2019 FLUOROSCOPY TIME:  Fluoroscopy Time:  4 seconds Radiation Exposure Index (if provided by the fluoroscopic device): Not  available Number of Acquired Spot Images: 1 FINDINGS: Single lateral spot film was obtained of the cervical spine. Interbody fusion is noted at C3-4, C4-5, C5-6 and C6-7. Anterior fixation plate is noted in satisfactory position. IMPRESSION: Status post cervical fusion from C3-C7. Electronically Signed   By: Alcide Clever M.D.   On: 08/05/2019 12:13    Assessment/Plan: Postop day 2 from 3 level acdf. Doing ok, continue therapies today. We did have a discussion about inpatient rehab. He is willing to go if his  insurance approves it.    LOS: 2 days    Brian Daniels [facility] 08/07/2019, 8:28 AM

## 2019-08-07 NOTE — Plan of Care (Signed)
  Problem: Safety: Goal: Ability to remain free from injury will improve 08/07/2019 1236 by Elba Barman, RN Outcome: Progressing 08/07/2019 1235 by Elba Barman, RN Outcome: Progressing   Problem: Education: Goal: Knowledge of General Education information will improve Description: Including pain rating scale, medication(s)/side effects and non-pharmacologic comfort measures 08/07/2019 1236 by Elba Barman, RN Outcome: Progressing 08/07/2019 1235 by Elba Barman, RN Outcome: Progressing   Problem: Health Behavior/Discharge Planning: Goal: Ability to manage health-related needs will improve 08/07/2019 1236 by Elba Barman, RN Outcome: Progressing 08/07/2019 1235 by Elba Barman, RN Outcome: Progressing   Problem: Clinical Measurements: Goal: Ability to maintain clinical measurements within normal limits will improve 08/07/2019 1236 by Elba Barman, RN Outcome: Progressing 08/07/2019 1235 by Elba Barman, RN Outcome: Progressing Goal: Will remain free from infection 08/07/2019 1236 by Elba Barman, RN Outcome: Progressing 08/07/2019 1235 by Elba Barman, RN Outcome: Progressing Goal: Diagnostic test results will improve 08/07/2019 1236 by Elba Barman, RN Outcome: Progressing 08/07/2019 1235 by Elba Barman, RN Outcome: Progressing Goal: Respiratory complications will improve 08/07/2019 1236 by Elba Barman, RN Outcome: Progressing 08/07/2019 1235 by Elba Barman, RN Outcome: Progressing Goal: Cardiovascular complication will be avoided 08/07/2019 1236 by Elba Barman, RN Outcome: Progressing 08/07/2019 1235 by Elba Barman, RN Outcome: Progressing   Problem: Activity: Goal: Risk for activity intolerance will decrease 08/07/2019 1236 by Elba Barman, RN Outcome: Progressing 08/07/2019 1235 by Elba Barman, RN Outcome: Progressing   Problem: Nutrition: Goal: Adequate nutrition will be maintained 08/07/2019 1236 by Elba Barman, RN Outcome:  Progressing 08/07/2019 1235 by Elba Barman, RN Outcome: Progressing   Problem: Coping: Goal: Level of anxiety will decrease 08/07/2019 1236 by Elba Barman, RN Outcome: Progressing 08/07/2019 1235 by Elba Barman, RN Outcome: Progressing   Problem: Elimination: Goal: Will not experience complications related to bowel motility 08/07/2019 1236 by Elba Barman, RN Outcome: Progressing 08/07/2019 1235 by Elba Barman, RN Outcome: Progressing Goal: Will not experience complications related to urinary retention 08/07/2019 1236 by Elba Barman, RN Outcome: Progressing 08/07/2019 1235 by Elba Barman, RN Outcome: Progressing   Problem: Pain Managment: Goal: General experience of comfort will improve 08/07/2019 1236 by Elba Barman, RN Outcome: Progressing 08/07/2019 1235 by Elba Barman, RN Outcome: Progressing   Problem: Safety: Goal: Ability to remain free from injury will improve 08/07/2019 1236 by Elba Barman, RN Outcome: Progressing 08/07/2019 1235 by Elba Barman, RN Outcome: Progressing   Problem: Skin Integrity: Goal: Risk for impaired skin integrity will decrease 08/07/2019 1236 by Elba Barman, RN Outcome: Progressing 08/07/2019 1235 by Elba Barman, RN Outcome: Progressing

## 2019-08-07 NOTE — Plan of Care (Signed)
  Problem: Safety: Goal: Ability to remain free from injury will improve Outcome: Progressing   Problem: Education: Goal: Knowledge of General Education information will improve Description: Including pain rating scale, medication(s)/side effects and non-pharmacologic comfort measures Outcome: Progressing   Problem: Health Behavior/Discharge Planning: Goal: Ability to manage health-related needs will improve Outcome: Progressing   Problem: Clinical Measurements: Goal: Ability to maintain clinical measurements within normal limits will improve Outcome: Progressing Goal: Will remain free from infection Outcome: Progressing Goal: Diagnostic test results will improve Outcome: Progressing Goal: Respiratory complications will improve Outcome: Progressing Goal: Cardiovascular complication will be avoided Outcome: Progressing   Problem: Activity: Goal: Risk for activity intolerance will decrease Outcome: Progressing   Problem: Nutrition: Goal: Adequate nutrition will be maintained Outcome: Progressing   Problem: Coping: Goal: Level of anxiety will decrease Outcome: Progressing   Problem: Elimination: Goal: Will not experience complications related to bowel motility Outcome: Progressing Goal: Will not experience complications related to urinary retention Outcome: Progressing   Problem: Pain Managment: Goal: General experience of comfort will improve Outcome: Progressing   Problem: Safety: Goal: Ability to remain free from injury will improve Outcome: Progressing   Problem: Skin Integrity: Goal: Risk for impaired skin integrity will decrease Outcome: Progressing   

## 2019-08-08 ENCOUNTER — Encounter: Payer: Self-pay | Admitting: *Deleted

## 2019-08-08 LAB — GLUCOSE, CAPILLARY
Glucose-Capillary: 126 mg/dL — ABNORMAL HIGH (ref 70–99)
Glucose-Capillary: 118 mg/dL — ABNORMAL HIGH (ref 70–99)
Glucose-Capillary: 81 mg/dL (ref 70–99)
Glucose-Capillary: 102 mg/dL — ABNORMAL HIGH (ref 70–99)
Glucose-Capillary: 106 mg/dL — ABNORMAL HIGH (ref 70–99)

## 2019-08-08 MED ORDER — MAGNESIUM CITRATE PO SOLN
1.0000 | Freq: Once | ORAL | Status: AC
  Administered 2019-08-08: 1 via ORAL
  Filled 2019-08-08: qty 296

## 2019-08-08 NOTE — Progress Notes (Signed)
  NEUROSURGERY PROGRESS NOTE   Updated patient's family member, [name], regarding current plan of care. Patient resides with [name] and she provides all necessary care for him. She is aware that patient worked with Physical and Occupational therapy and is being considered for CIR.  We are awaiting their evaluation.  She would like an update if he is going to be considered.  She can be reached at [phone]. She is not interested in pursuing a sniff if he is not a candidate for CIR.  If that is the case, he will be discharged tomorrow.   Total time spent 21 minutes.

## 2019-08-08 NOTE — PMR Pre-admission (Signed)
PMR Admission Coordinator Pre-Admission Assessment  Patient: Brian Daniels is an 64 y.o., male MRN: [id number] DOB: April 07, 1956 Height: '5\' 10"'  (177.8 cm) Weight: 73.9 kg  Insurance Information HMO: yes    PPO:      PCP:      IPA:      80/20:      OTHER:  PRIMARY: Blue Medicare      Policy#: [id number]      Subscriber: pt CM Name: Shanon      Phone#:[phone]      Fax#: [phone] Pre-Cert#: TBD approved for 7 days      Employer:  Benefits:  Phone #: [phone]     Name: 1/25 Eff. Date: 07/15/2019     Deduct: none      Out of Pocket Max: $4200      Life Max: none CIR: $335 co pay per day days 1 until 6      SNF: no co pay days 1 until 20; $184 co pay per day days 21 until 60; no co pay days 61 until 100 Outpatient: $40 per visit     Co-Pay: visits per medical neccesity Home Health: 100%      Co-Pay: visits per medical neccesity DME: 80%     Co-Pay: 20% Providers: in network  SECONDARY: none       Medicaid Application Date:       Case Manager:  Disability Application Date:       Case Worker:   The "Data Collection Information Summary" for patients in Inpatient Rehabilitation Facilities with attached "Privacy Act [place] Records" was provided and verbally reviewed with: Patient and Family  Emergency Contact Information Contact Information    Name Relation Home Work Mobile   [name], Heidrich Sister   [phone]      Current Medical History  Patient Admitting Diagnosis: cervical myelopathy  History of Present Illness: 64 year old male with past medical history of legal blindness, DM type 2, gout, hypercholesteremia, HTN, left atrial enlargement, and neuropathy. Presented on 08/05/2019 with two months progressive weakness with symptoms of cervical myelopathy. MRI confirmed stenosis with T2 signal change form C3-4 through C6-7. Although he does have ventricomegaly, his symptoms are more consistent with cervical myelopathy rather than normal pressure hydrocephalus  per Neurosurgery. Patient presented for anterior cervical diskectomy and fusion at C3-4, C4-5, C5-6 and C6-7 on 08/05/2019.  Postoperatively reports some neck pain and left arm numbness persists. 08/06/2019 with I and O cath due to urinary retention. Some constipation noted with scheduled stool softer and laxative for bowel regimen.     Patient's medical record from [facility]  has been reviewed by the rehabilitation admission coordinator and physician.  Past Medical History  Past Medical History:  Diagnosis Date  . Cataracts, bilateral    removed by surgery  . Diabetes mellitus without complication ([place])    type 2  . Glaucoma    bilateral  . Gout   . Hypercholesteremia   . Hypertension   . Left atrial enlargement   . Legally blind    bilateral  . Neuropathy    hands, arm  . Numbness    hands, arms  . Wears partial dentures    lower  . Wheelchair bound    since 05/30/19    Family History   family history includes Diabetes in his mother; Hyperlipidemia in his mother; Hypertension in his father.  Prior Rehab/Hospitalizations Has the patient had prior rehab or hospitalizations prior to admission? Yes  Has the  patient had major surgery during 100 days prior to admission? Yes   Current Medications  Current Facility-Administered Medications:  .  0.9 %  sodium chloride infusion, , Intravenous, Continuous, Costella, Vincent J, PA-C .  0.9 %  sodium chloride infusion, 250 mL, Intravenous, Continuous, Costella, Vincent J, PA-C, Last Rate: 1 mL/hr at 08/05/19 1623, 250 mL at 08/05/19 1623 .  acetaminophen (TYLENOL) tablet 650 mg, 650 mg, Oral, Q4H PRN **OR** acetaminophen (TYLENOL) suppository 650 mg, 650 mg, Rectal, Q4H PRN, Costella, Vincent J, PA-C .  amLODipine (NORVASC) tablet 5 mg, 5 mg, Oral, Daily, Costella, Vincent J, PA-C, 5 mg at 08/09/19 1003 .  atorvastatin (LIPITOR) tablet 20 mg, 20 mg, Oral, Daily, Costella, Vincent J, PA-C, 20 mg at 08/09/19 1003 .   benazepril (LOTENSIN) tablet 10 mg, 10 mg, Oral, Daily, Costella, Vincent J, PA-C, 10 mg at 08/09/19 1003 .  bisacodyl (DULCOLAX) suppository 10 mg, 10 mg, Rectal, Daily PRN, Costella, Vincent J, PA-C .  brimonidine (ALPHAGAN) 0.2 % ophthalmic solution 1 drop, 1 drop, Both Eyes, TID, Costella, Vincent J, PA-C, 1 drop at 08/09/19 1006 .  6 CHG cloth bath night before surgery, , , Once **AND** 6 CHG cloth bath AM of surgery, , , Once **AND** Chlorhexidine Gluconate Cloth 2 % PADS 6 each, 6 each, Topical, Once **AND** Chlorhexidine Gluconate Cloth 2 % PADS 6 each, 6 each, Topical, Once, Costella, Vincent J, PA-C .  docusate sodium (COLACE) capsule 100 mg, 100 mg, Oral, BID, Costella, Vincent J, PA-C, 100 mg at 08/09/19 1004 .  HYDROcodone-acetaminophen (NORCO/VICODIN) 5-325 MG per tablet 1 tablet, 1 tablet, Oral, Q4H PRN, Costella, Vista Mink, PA-C, 1 tablet at 08/09/19 1004 .  HYDROmorphone (DILAUDID) injection 0.5-1 mg, 0.5-1 mg, Intravenous, Q2H PRN, Costella, Vincent J, PA-C .  insulin aspart (novoLOG) injection 0-15 Units, 0-15 Units, Subcutaneous, TID WC, Costella, Vista Mink, PA-C, 2 Units at 08/08/19 [phone] .  insulin aspart (novoLOG) injection 0-5 Units, 0-5 Units, Subcutaneous, QHS, Costella, Vincent Lenna Sciara, PA-C, 2 Units at 08/05/19 2259 .  menthol-cetylpyridinium (CEPACOL) lozenge 3 mg, 1 lozenge, Oral, PRN **OR** phenol (CHLORASEPTIC) mouth spray 1 spray, 1 spray, Mouth/Throat, PRN, Costella, Vista Mink, PA-C, 1 spray at 08/08/19 2252 .  metFORMIN (GLUCOPHAGE-XR) 24 hr tablet 500 mg, 500 mg, Oral, QHS, Costella, Vincent J, PA-C, 500 mg at 08/08/19 2201 .  methocarbamol (ROBAXIN) tablet 500 mg, 500 mg, Oral, Q6H PRN **OR** methocarbamol (ROBAXIN) 500 mg in dextrose 5 % 50 mL IVPB, 500 mg, Intravenous, Q6H PRN, Costella, Vincent J, PA-C .  metoprolol succinate (TOPROL-XL) 24 hr tablet 25 mg, 25 mg, Oral, Daily, Costella, Vincent J, PA-C, 25 mg at 08/09/19 1004 .  ondansetron (ZOFRAN) tablet 4 mg, 4 mg,  Oral, Q6H PRN **OR** ondansetron (ZOFRAN) injection 4 mg, 4 mg, Intravenous, Q6H PRN, Costella, Vincent J, PA-C .  oxyCODONE (Oxy IR/ROXICODONE) immediate release tablet 5-10 mg, 5-10 mg, Oral, Q3H PRN, Costella, Vincent J, PA-C .  prednisoLONE acetate (PRED FORTE) 1 % ophthalmic suspension 1 drop, 1 drop, Right Eye, BID, Costella, Vincent J, PA-C, 1 drop at 08/09/19 1007 .  scopolamine (TRANSDERM-SCOP) 1 MG/3DAYS 1.5 mg, 1 patch, Transdermal, Q72H, Costella, Vincent J, PA-C, 1.5 mg at 08/08/19 0559 .  senna (SENOKOT) tablet 8.6 mg, 1 tablet, Oral, BID, Costella, Vincent J, PA-C, 8.6 mg at 08/09/19 1004 .  senna-docusate (Senokot-S) tablet 1 tablet, 1 tablet, Oral, QHS PRN, Costella, Vincent J, PA-C .  sodium chloride flush (NS) 0.9 % injection 3 mL, 3 mL,  Intravenous, Q12H, Costella, Vincent J, PA-C, 3 mL at 08/09/19 1006 .  sodium chloride flush (NS) 0.9 % injection 3 mL, 3 mL, Intravenous, PRN, Costella, Vincent J, PA-C .  sodium phosphate (FLEET) 7-19 GM/118ML enema 1 enema, 1 enema, Rectal, Once PRN, Costella, Vincent J, PA-C .  timolol (TIMOPTIC) 0.5 % ophthalmic solution 1 drop, 1 drop, Both Eyes, BID, Consuella Lose, MD, 1 drop at 08/09/19 1007  Patients Current Diet:  Diet Order            Diet general        Diet regular Room service appropriate? Yes; Fluid consistency: Thin  Diet effective now              Precautions / Restrictions Precautions Precautions: Fall Precaution Comments: pt legally blind in both eyes Cervical Brace: Hard collar(when up/OOB) Restrictions Weight Bearing Restrictions: No   Has the patient had 2 or more falls or a fall with injury in the past year? No  Prior Activity Level Limited Community (1-2x/wk): wheelchair transfers since 05/2019. prior to that Independent  Prior Functional Level Self Care: Did the patient need help bathing, dressing, using the toilet or eating? Needed some help  Indoor Mobility: Did the patient need assistance with  walking from room to room (with or without device)? Needed some help  Stairs: Did the patient need assistance with internal or external stairs (with or without device)? Needed some help  Functional Cognition: Did the patient need help planning regular tasks such as shopping or remembering to take medications? Independent  Home Assistive Devices / Equipment Home Assistive Devices/Equipment: Blood pressure cuff, CBG Meter, Dentures (specify type), Wheelchair Home Equipment: Wheelchair - manual  Prior Device Use: Indicate devices/aids used by the patient prior to current illness, exacerbation or injury? Walker and manual wheelchair  Current Functional Level Cognition  Overall Cognitive Status: Within Functional Limits for tasks assessed Orientation Level: Oriented X4 General Comments: Pt following all commadns and asked to repeat instructions when unsure of what was asked    Extremity Assessment (includes Sensation/Coordination)  Upper Extremity Assessment: Generalized weakness RUE Deficits / Details: AAROM to 90* FF, AROM elbow through digits, WFLs RUE Sensation: decreased proprioception RUE Coordination: decreased fine motor LUE Deficits / Details: AAROM to 65* in FF, AAROM elbow through hand limited due to swelling LUE Sensation: decreased proprioception LUE Coordination: decreased fine motor  Lower Extremity Assessment: Generalized weakness, Defer to PT evaluation RLE Deficits / Details: incr flexor tone, but able to override to come to standing; knee extension grossly 3+ RLE Sensation: decreased light touch RLE Coordination: decreased gross motor LLE Deficits / Details: weaker than rt; also with incr tone; knee extension grossly 3- LLE Sensation: decreased light touch LLE Coordination: decreased gross motor    ADLs  Overall ADL's : Needs assistance/impaired Eating/Feeding: Set up, Sitting Grooming: Wash/dry face, Minimal assistance, Sitting Upper Body Bathing: Moderate  assistance, Cueing for safety, Cueing for sequencing, Sitting Lower Body Bathing: Maximal assistance, Sit to/from stand, Cueing for safety, Cueing for sequencing Upper Body Dressing : Total assistance Upper Body Dressing Details (indicate cue type and reason): donning cervical collar Lower Body Dressing: Maximal assistance, Sit to/from stand Toilet Transfer: Maximal assistance, +2 for physical assistance, +2 for safety/equipment, Cueing for safety Toileting- Clothing Manipulation and Hygiene: Maximal assistance, Cueing for safety, Cueing for sequencing Tub/ Shower Transfer: +2 for physical assistance, +2 for safety/equipment, Moderate assistance Functional mobility during ADLs: Moderate assistance, +2 for physical assistance, +2 for safety/equipment, Cueing for safety, Rolling  walker General ADL Comments: Pt limited by pain, decreased ability to care for self and decreased strength with low vision at baseline in a new environment.    Mobility  Overal bed mobility: Needs Assistance Bed Mobility: Rolling, Sidelying to Sit Rolling: Mod assist Sidelying to sit: Mod assist, +2 for physical assistance General bed mobility comments: pt very retropulsive, requiring max verbal and tactile cues to complete task, modA for trunk elevation    Transfers  Overall transfer level: Needs assistance Equipment used: Rolling walker (2 wheeled) Transfers: Sit to/from Stand Sit to Stand: Mod assist, +2 physical assistance, +2 safety/equipment, From elevated surface General transfer comment: Bed elevated, modA +2 for power upl R knee blocked; pt with posterior lean and R lateral lean with multimodal cues to fix posture    Ambulation / Gait / Stairs / Wheelchair Mobility  Ambulation/Gait Ambulation/Gait assistance: Mod assist, +2 safety/equipment Gait Distance (Feet): 25 Feet Assistive device: 2 person hand held assist Gait Pattern/deviations: Decreased stride length, Step-through pattern, Narrow base of  support General Gait Details: pt request to have bilat HHA due to vision impairment. pt with ataxic like gait pattern with near scissoring/cross over, pt very dependent on bilat HHA, pt began to become shaky due to fatigue Gait velocity: slow Gait velocity interpretation: <1.8 ft/sec, indicate of risk for recurrent falls    Posture / Balance Dynamic Sitting Balance Sitting balance - Comments: verbal cues to attain midline upright posture, pt with R lateral/posterior lean Balance Overall balance assessment: Needs assistance Sitting-balance support: Bilateral upper extremity supported, Feet supported Sitting balance-Leahy Scale: Poor Sitting balance - Comments: verbal cues to attain midline upright posture, pt with R lateral/posterior lean Postural control: Right lateral lean, Posterior lean Standing balance support: Bilateral upper extremity supported Standing balance-Leahy Scale: Poor    Special needs/care consideration BiPAP/CPAP  CPM  Continuous Drip IV  Dialysis         Life Vest  Oxygen  Special Bed  Trach Size  Wound Vac  Skin  Surgical incision Bowel mgmt: constipation Bladder mgmt: external catheter Diabetic mgmt: DM type 2 Behavioral consideration  Chemo/radiation  Designated visitor is sister, [name] LEGALLY BLIND   Previous Home Environment  Living Arrangements: (lives with hi sister for the past year. From [place], [place])  Lives With: Family(sister, [name]) Available Help at Discharge: Family, Available 24 hours/day Type of Home: House Home Layout: One level Home Access: Stairs to enter Entrance Stairs-Rails: None Entrance Stairs-Number of Steps: 1 Bathroom Shower/Tub: Chiropodist: Handicapped height Bathroom Accessibility: Yes How Accessible: Accessible via wheelchair [place]: No  Discharge Living Setting Plans for Discharge Living Setting: Lives with (comment)(sister, [name]) Type of Home at Discharge: House Discharge Home  Layout: One level Discharge Home Access: Stairs to enter Entrance Stairs-Rails: None Entrance Stairs-Number of Steps: 1 Discharge Bathroom Shower/Tub: Tub/shower unit, Curtain Discharge Bathroom Toilet: Handicapped height Does the patient have any problems obtaining your medications?: No  Social/Family/Support Systems Contact Information: [name], sister Anticipated Caregiver: sister Anticipated Ambulance person Information: [phone] Ability/Limitations of Caregiver: no limits Caregiver Availability: 24/7 Discharge Plan Discussed with Primary Caregiver: Yes Is Caregiver In Agreement with Plan?: Yes Does Caregiver/Family have Issues with Lodging/Transportation while Pt is in Rehab?: No  Goals/Additional Needs Patient/Family Goal for Rehab: supervision PT, supervision to min OT Expected length of stay: ELOS 10 to 14 days Special Service Needs: Legally Blind Pt/Family Agrees to Admission and willing to participate: Yes Program Orientation Provided & Reviewed with Pt/Caregiver Including Roles  & Responsibilities:  Yes  Decrease burden of Care through IP rehab admission:   Possible need for SNF placement upon discharge:   Patient Condition: I have reviewed medical records from [facility] , spoken with  patient and family member. I met with patient at the bedside for inpatient rehabilitation assessment.  Patient will benefit from ongoing PT and OT, can actively participate in 3 hours of therapy a day 5 days of the week, and can make measurable gains during the admission.  Patient will also benefit from the coordinated team approach during an Inpatient Acute Rehabilitation admission.  The patient will receive intensive therapy as well as Rehabilitation physician, nursing, social worker, and care management interventions.  Due to bladder management, bowel management, safety, skin/wound care, disease management, medication administration, pain management and patient education the  patient requires 24 hour a day rehabilitation nursing.  The patient is currently min to mod assist with mobility and basic ADLs.  Discharge setting and therapy post discharge at home with home health is anticipated.  Patient has agreed to participate in the Acute Inpatient Rehabilitation Program and will admit today.  Preadmission Screen Completed By:  Cleatrice Burke, 08/09/2019 1:01 PM ______________________________________________________________________   Discussed status with Dr. Dagoberto Ligas on  08/09/2019 at  1305 and received approval for admission today.  Admission Coordinator:  Cleatrice Burke, RN, time  1281 Date  08/09/2019   Assessment/Plan: Diagnosis: 1. Does the need for close, 24 hr/day Medical supervision in concert with the patient's rehab needs make it unreasonable for this patient to be served in a less intensive setting? Yes 2. Co-Morbidities requiring supervision/potential complications: Cervical mylopathy/SCI, legal blindness due to glaucoma, HTN, HLD, DM 3. Due to bladder management, bowel management, safety, skin/wound care, disease management, medication administration, pain management and patient education, does the patient require 24 hr/day rehab nursing? Yes 4. Does the patient require coordinated care of a physician, rehab nurse, PT, OT, and SLP to address physical and functional deficits in the context of the above medical diagnosis(es)? Yes Addressing deficits in the following areas: balance, endurance, locomotion, strength, transferring, bowel/bladder control, bathing, dressing, feeding, grooming and toileting 5. Can the patient actively participate in an intensive therapy program of at least 3 hrs of therapy 5 days a week? Yes 6. The potential for patient to make measurable gains while on inpatient rehab is good 7. Anticipated functional outcomes upon discharge from inpatient rehab: supervision PT, supervision and min assist OT, n/a SLP 8. Estimated rehab  length of stay to reach the above functional goals is: 10-14 days 9. Anticipated discharge destination: Home 10. Overall Rehab/Functional Prognosis: good   MD Signature:

## 2019-08-08 NOTE — Progress Notes (Signed)
Inpatient Rehabilitation Admissions Coordinator  Inpatient rehab consult received. I met with patient with his sister, [name] at bedside. We discussed goals and expectations of an inpt rehab admit. They prefer CIR, refuse SNF. I will begin insurance authorization with [facility] for a possible admit.  Danne Baxter, RN, MSN Rehab Admissions Coordinator [phone] 08/08/2019 3:22 PM

## 2019-08-08 NOTE — Progress Notes (Signed)
Physical Therapy Treatment Patient Details Name: Brian Daniels MRN: [id number] DOB: Apr 19, 1956 Today's Date: 08/08/2019    History of Present Illness 64 y.o. male who was found to have multilevel mod-severe cervical stenosis with myelopathy (primarily left-sided numbness and tingling and weakness) and w/c bound since 05/30/19 pt fell and weakness started (slipped while walking). MRI also showed signal change in spinal cord at C3-4 level. 08/05/19 underwent ACDF C3-7.   PMH-lumbar pain, DM, bil glaucoma legally blind, HTN,    PT Comments    Pt very pleasant and eager to work with PT today. Pt with improved transfer and ambulation tolerance today. Pt continues to demo retropulsion and R lateral lean in sitting and standing in which he can self-correct with verbal cues. Pt continues to be an excellent candidate for CIR upon d/c to progress towards indep with transfers and ambulation for safe d/c home with sister.    Follow Up Recommendations  CIR;Supervision/Assistance - 24 hour     Equipment Recommendations  Rolling walker with 5" wheels    Recommendations for Other Services       Precautions / Restrictions Precautions Precautions: Fall Precaution Comments: pt legally blind in both eyes Required Braces or Orthoses: Cervical Brace Cervical Brace: Hard collar(when up/OOB) Restrictions Weight Bearing Restrictions: No    Mobility  Bed Mobility Overal bed mobility: Needs Assistance Bed Mobility: Rolling;Sidelying to Sit Rolling: Mod assist Sidelying to sit: Mod assist;+2 for physical assistance       General bed mobility comments: pt very retropulsive, requiring max verbal and tactile cues to complete task, modA for trunk elevation  Transfers Overall transfer level: Needs assistance Equipment used: Rolling walker (2 wheeled) Transfers: Sit to/from Stand Sit to Stand: Mod assist;+2 physical assistance;+2 safety/equipment;From elevated surface         General transfer  comment: bed elevated, pt initially retropulsing upon attempting to stand, tactile cues to bend forward at the hips and power up throught the LEs. pt also with strong R lateral lean  upon initial stand requiring verbal cues to correct  Ambulation/Gait Ambulation/Gait assistance: Mod assist;+2 safety/equipment Gait Distance (Feet): 25 Feet Assistive device: 2 person hand held assist Gait Pattern/deviations: Decreased stride length;Step-through pattern;Narrow base of support Gait velocity: slow Gait velocity interpretation: <1.8 ft/sec, indicate of risk for recurrent falls General Gait Details: pt request to have bilat HHA due to vision impairment. pt with ataxic like gait pattern with near scissoring/cross over, pt very dependent on bilat HHA, pt began to become shaky due to fatigue   Stairs             Wheelchair Mobility    Modified Rankin (Stroke Patients Only)       Balance Overall balance assessment: Needs assistance Sitting-balance support: Bilateral upper extremity supported;Feet supported Sitting balance-Leahy Scale: Poor Sitting balance - Comments: verbal cues to attain midline upright posture, pt with R lateral/posterior lean Postural control: Right lateral lean;Posterior lean Standing balance support: Bilateral upper extremity supported Standing balance-Leahy Scale: Poor                              Cognition Arousal/Alertness: Awake/alert Behavior During Therapy: WFL for tasks assessed/performed Overall Cognitive Status: Within Functional Limits for tasks assessed                                 General Comments: pt with great command follow  Exercises      General Comments General comments (skin integrity, edema, etc.): VSS      Pertinent Vitals/Pain Pain Assessment: 0-10 Pain Score: 3  Pain Location: neck Pain Descriptors / Indicators: Guarding Pain Intervention(s): Limited activity within patient's tolerance     Home Living                      Prior Function            PT Goals (current goals can now be found in the care plan section) Progress towards PT goals: Progressing toward goals    Frequency    Min 5X/week      PT Plan Current plan remains appropriate    Co-evaluation PT/OT/SLP Co-Evaluation/Treatment: Yes Reason for Co-Treatment: Complexity of the patient's impairments (multi-system involvement) PT goals addressed during session: Mobility/safety with mobility        AM-PAC PT "6 Clicks" Mobility   Outcome Measure  Help needed turning from your back to your side while in a flat bed without using bedrails?: A Lot Help needed moving from lying on your back to sitting on the side of a flat bed without using bedrails?: A Lot Help needed moving to and from a bed to a chair (including a wheelchair)?: A Lot Help needed standing up from a chair using your arms (e.g., wheelchair or bedside chair)?: A Lot Help needed to walk in hospital room?: A Lot Help needed climbing 3-5 steps with a railing? : Total 6 Click Score: 11    End of Session Equipment Utilized During Treatment: Gait belt;Cervical collar Activity Tolerance: Patient tolerated treatment well Patient left: in chair;with call bell/phone within reach;with chair alarm set;with nursing/sitter in room Nurse Communication: Mobility status;Other (comment) PT Visit Diagnosis: Other abnormalities of gait and mobility (R26.89);Muscle weakness (generalized) (M62.81);Other symptoms and signs involving the nervous system (R29.898)     Time: 7564-3329 PT Time Calculation (min) (ACUTE ONLY): 23 min  Charges:  $Gait Training: 8-22 mins                     Lewis Shock, PT, DPT Acute Rehabilitation Services Pager #: [phone] Office #: [phone]    Iona Hansen 08/08/2019, 2:14 PM

## 2019-08-08 NOTE — Progress Notes (Signed)
  NEUROSURGERY PROGRESS NOTE   No issues overnight.  No concerns this am No new N/T/W  EXAM:  BP (!) 165/84 (BP Location: Left Arm)   Pulse 61   Temp (!) 97.5 F (36.4 C) (Oral)   Resp 20   Ht 5\' 10"  (1.778 m)   Wt 73.9 kg   SpO2 97%   BMI 23.39 kg/m   Awake, alert, oriented  Speech fluent, appropriate  CN grossly intact  Stable motor/sensory Incision: c/d/i  IMPRESSION/PLAN 64 y.o. male  Pod #3 C3-4, C4-5, C5-6, C6-7 ACDF for cervical stenosis with myelopathy. Stable neurologically. - possible CIR candidate. Consult placed. Appreciate assistance. - continue current care

## 2019-08-08 NOTE — Progress Notes (Signed)
Occupational Therapy Treatment Patient Details Name: Brian Daniels MRN: [id number] DOB: [birth year] Today's Date: 08/08/2019    History of present illness 64 y.o. male who was found to have multilevel mod-severe cervical stenosis with myelopathy (primarily left-sided numbness and tingling and weakness) and w/c bound since 05/30/19 pt fell and weakness started (slipped while walking). MRI also showed signal change in spinal cord at C3-4 level. 08/05/19 underwent ACDF C3-7.   PMH-lumbar pain, DM, bil glaucoma legally blind, HTN,   OT comments  Pt progressing to OOB mobility.  Pt limited by pain, decreased ability to care for self and decreased strength with low vision at baseline in a new environment. Pt's LUE AAROM to 65* in FF, AAROM elbow through hand limited due to swelling. Pt's LUE placed on pillow and encouraged to use LUE for light functional tasks. Pt able to use RW for mobility with multimodal cues to navigate through environment. Pt totalA to donn cervical collar at this time. Pt aware of precautions, but requiring assist to state them. Pt requires increased assist for ADL and has severe pain in L shoulder. Pt very motivated to return to PLOF and increase function. Pt would be a great CIR candidate in order to increase ADL function. OT following acutely.   Follow Up Recommendations  CIR;Supervision/Assistance - 24 hour    Equipment Recommendations  Other (comment)(to be determined at next venue)    Recommendations for Other Services      Precautions / Restrictions Precautions Precautions: Fall Precaution Comments: pt legally blind in both eyes Required Braces or Orthoses: Cervical Brace Cervical Brace: Hard collar(when up/OOB) Restrictions Weight Bearing Restrictions: No       Mobility Bed Mobility Overal bed mobility: Needs Assistance Bed Mobility: Rolling;Sidelying to Sit Rolling: Mod assist Sidelying to sit: Mod assist;+2 for physical assistance       General bed  mobility comments: pt very retropulsive, requiring max verbal and tactile cues to complete task, modA for trunk elevation  Transfers Overall transfer level: Needs assistance Equipment used: Rolling walker (2 wheeled) Transfers: Sit to/from Stand Sit to Stand: Mod assist;+2 physical assistance;+2 safety/equipment;From elevated surface         General transfer comment: Bed elevated, modA +2 for power upl R knee blocked; pt with posterior lean and R lateral lean with multimodal cues to fix posture    Balance Overall balance assessment: Needs assistance Sitting-balance support: Bilateral upper extremity supported;Feet supported Sitting balance-Leahy Scale: Poor Sitting balance - Comments: verbal cues to attain midline upright posture, pt with R lateral/posterior lean Postural control: Right lateral lean;Posterior lean Standing balance support: Bilateral upper extremity supported Standing balance-Leahy Scale: Poor                             ADL either performed or assessed with clinical judgement   ADL Overall ADL's : Needs assistance/impaired                 Upper Body Dressing : Total assistance Upper Body Dressing Details (indicate cue type and reason): donning cervical collar                 Functional mobility during ADLs: Moderate assistance;+2 for physical assistance;+2 for safety/equipment;Cueing for safety;Rolling walker General ADL Comments: Pt limited by pain, decreased ability to care for self and decreased strength with low vision at baseline in a new environment.     Vision       Perception  Praxis      Cognition Arousal/Alertness: Awake/alert Behavior During Therapy: WFL for tasks assessed/performed Overall Cognitive Status: Within Functional Limits for tasks assessed                                 General Comments: Pt following all commadns and asked to repeat instructions when unsure of what was asked         Exercises     Shoulder Instructions       General Comments VSS on RA    Pertinent Vitals/ Pain       Pain Assessment: 0-10 Pain Score: 3  Pain Location: neck Pain Descriptors / Indicators: Guarding Pain Intervention(s): Limited activity within patient's tolerance  Home Living   Living Arrangements: (lives with hi sister for the past year. From [place], [place])                                  Lives With: Family(sister, [name])    Prior Functioning/Environment              Frequency  Min 2X/week        Progress Toward Goals  OT Goals(current goals can now be found in the care plan section)  Progress towards OT goals: Progressing toward goals  Acute Rehab OT Goals Patient Stated Goal: return home OT Goal Formulation: With patient Time For Goal Achievement: 08/20/19 Potential to Achieve Goals: Good ADL Goals Pt Will Perform Grooming: with supervision;sitting Pt Will Perform Upper Body Dressing: with min assist;sitting Pt Will Transfer to Toilet: with min guard assist;ambulating;regular height toilet  Plan Discharge plan remains appropriate    Co-evaluation    PT/OT/SLP Co-Evaluation/Treatment: Yes Reason for Co-Treatment: Complexity of the patient's impairments (multi-system involvement);For patient/therapist safety;To address functional/ADL transfers PT goals addressed during session: Mobility/safety with mobility OT goals addressed during session: ADL's and self-care      AM-PAC OT "6 Clicks" Daily Activity     Outcome Measure   Help from another person eating meals?: A Little Help from another person taking care of personal grooming?: A Little Help from another person toileting, which includes using toliet, bedpan, or urinal?: A Lot Help from another person bathing (including washing, rinsing, drying)?: A Lot Help from another person to put on and taking off regular upper body clothing?: A Lot Help from another person to put on and  taking off regular lower body clothing?: A Lot 6 Click Score: 14    End of Session Equipment Utilized During Treatment: Gait belt;Rolling walker;Cervical collar  OT Visit Diagnosis: Unsteadiness on feet (R26.81);Muscle weakness (generalized) (M62.81);History of falling (Z91.81);Pain Pain - Right/Left: Left Pain - part of body: Shoulder   Activity Tolerance Patient tolerated treatment well   Patient Left in chair;with call bell/phone within reach;with chair alarm set   Nurse Communication Mobility status        Time: 5053-9767 OT Time Calculation (min): 23 min  Charges: OT General Charges $OT Visit: 1 Visit OT Treatments $Therapeutic Activity: 8-22 mins  Flora Lipps OTR/L Acute Rehabilitation Services Pager: [phone] Office: [phone]    Erinn Huskins C 08/08/2019, 4:51 PM

## 2019-08-09 ENCOUNTER — Inpatient Hospital Stay (HOSPITAL_COMMUNITY)
Admission: RE | Admit: 2019-08-09 | Discharge: 2019-08-26 | DRG: 559 | Disposition: A | Discharge status: Home Health | Payer: Medicare Other | Source: Intra-hospital | Attending: Physical Medicine and Rehabilitation | Admitting: Physical Medicine and Rehabilitation

## 2019-08-09 ENCOUNTER — Other Ambulatory Visit: Payer: Self-pay

## 2019-08-09 DIAGNOSIS — M7989 Other specified soft tissue disorders: Secondary | ICD-10-CM | POA: Diagnosis not present

## 2019-08-09 DIAGNOSIS — Z4789 Encounter for other orthopedic aftercare: Principal | ICD-10-CM

## 2019-08-09 DIAGNOSIS — E7800 Pure hypercholesterolemia, unspecified: Secondary | ICD-10-CM | POA: Diagnosis present

## 2019-08-09 DIAGNOSIS — E785 Hyperlipidemia, unspecified: Secondary | ICD-10-CM | POA: Diagnosis present

## 2019-08-09 DIAGNOSIS — Z8349 Family history of other endocrine, nutritional and metabolic diseases: Secondary | ICD-10-CM

## 2019-08-09 DIAGNOSIS — H547 Unspecified visual loss: Secondary | ICD-10-CM

## 2019-08-09 DIAGNOSIS — H548 Legal blindness, as defined in USA: Secondary | ICD-10-CM | POA: Diagnosis present

## 2019-08-09 DIAGNOSIS — M25512 Pain in left shoulder: Secondary | ICD-10-CM | POA: Diagnosis present

## 2019-08-09 DIAGNOSIS — R252 Cramp and spasm: Secondary | ICD-10-CM | POA: Diagnosis present

## 2019-08-09 DIAGNOSIS — Z981 Arthrodesis status: Secondary | ICD-10-CM | POA: Diagnosis not present

## 2019-08-09 DIAGNOSIS — Z9841 Cataract extraction status, right eye: Secondary | ICD-10-CM

## 2019-08-09 DIAGNOSIS — E1140 Type 2 diabetes mellitus with diabetic neuropathy, unspecified: Secondary | ICD-10-CM | POA: Diagnosis present

## 2019-08-09 DIAGNOSIS — Z993 Dependence on wheelchair: Secondary | ICD-10-CM | POA: Diagnosis not present

## 2019-08-09 DIAGNOSIS — N319 Neuromuscular dysfunction of bladder, unspecified: Secondary | ICD-10-CM | POA: Diagnosis present

## 2019-08-09 DIAGNOSIS — I10 Essential (primary) hypertension: Secondary | ICD-10-CM | POA: Diagnosis present

## 2019-08-09 DIAGNOSIS — Z961 Presence of intraocular lens: Secondary | ICD-10-CM | POA: Diagnosis present

## 2019-08-09 DIAGNOSIS — G9520 Unspecified cord compression: Secondary | ICD-10-CM | POA: Diagnosis present

## 2019-08-09 DIAGNOSIS — K5900 Constipation, unspecified: Secondary | ICD-10-CM | POA: Diagnosis present

## 2019-08-09 DIAGNOSIS — G8324 Monoplegia of upper limb affecting left nondominant side: Secondary | ICD-10-CM | POA: Diagnosis present

## 2019-08-09 DIAGNOSIS — H409 Unspecified glaucoma: Secondary | ICD-10-CM | POA: Diagnosis present

## 2019-08-09 DIAGNOSIS — Z7984 Long term (current) use of oral hypoglycemic drugs: Secondary | ICD-10-CM

## 2019-08-09 DIAGNOSIS — G959 Disease of spinal cord, unspecified: Secondary | ICD-10-CM | POA: Diagnosis present

## 2019-08-09 DIAGNOSIS — Z7982 Long term (current) use of aspirin: Secondary | ICD-10-CM

## 2019-08-09 DIAGNOSIS — R05 Cough: Secondary | ICD-10-CM | POA: Diagnosis not present

## 2019-08-09 DIAGNOSIS — E119 Type 2 diabetes mellitus without complications: Secondary | ICD-10-CM | POA: Diagnosis present

## 2019-08-09 DIAGNOSIS — D649 Anemia, unspecified: Secondary | ICD-10-CM | POA: Diagnosis not present

## 2019-08-09 DIAGNOSIS — Z9842 Cataract extraction status, left eye: Secondary | ICD-10-CM | POA: Diagnosis not present

## 2019-08-09 DIAGNOSIS — R202 Paresthesia of skin: Secondary | ICD-10-CM | POA: Diagnosis present

## 2019-08-09 DIAGNOSIS — R197 Diarrhea, unspecified: Secondary | ICD-10-CM | POA: Diagnosis not present

## 2019-08-09 DIAGNOSIS — R260 Ataxic gait: Secondary | ICD-10-CM | POA: Diagnosis present

## 2019-08-09 DIAGNOSIS — M62838 Other muscle spasm: Secondary | ICD-10-CM | POA: Diagnosis present

## 2019-08-09 DIAGNOSIS — Z833 Family history of diabetes mellitus: Secondary | ICD-10-CM

## 2019-08-09 DIAGNOSIS — K592 Neurogenic bowel, not elsewhere classified: Secondary | ICD-10-CM | POA: Diagnosis present

## 2019-08-09 DIAGNOSIS — G8252 Quadriplegia, C1-C4 incomplete: Secondary | ICD-10-CM | POA: Diagnosis present

## 2019-08-09 DIAGNOSIS — G992 Myelopathy in diseases classified elsewhere: Secondary | ICD-10-CM

## 2019-08-09 DIAGNOSIS — Z79899 Other long term (current) drug therapy: Secondary | ICD-10-CM

## 2019-08-09 DIAGNOSIS — M4802 Spinal stenosis, cervical region: Principal | ICD-10-CM

## 2019-08-09 DIAGNOSIS — M109 Gout, unspecified: Secondary | ICD-10-CM | POA: Diagnosis present

## 2019-08-09 DIAGNOSIS — R531 Weakness: Secondary | ICD-10-CM | POA: Diagnosis present

## 2019-08-09 DIAGNOSIS — E876 Hypokalemia: Secondary | ICD-10-CM | POA: Diagnosis present

## 2019-08-09 DIAGNOSIS — Z8249 Family history of ischemic heart disease and other diseases of the circulatory system: Secondary | ICD-10-CM

## 2019-08-09 DIAGNOSIS — R3915 Urgency of urination: Secondary | ICD-10-CM | POA: Diagnosis present

## 2019-08-09 DIAGNOSIS — Z87891 Personal history of nicotine dependence: Secondary | ICD-10-CM

## 2019-08-09 LAB — GLUCOSE, CAPILLARY
Glucose-Capillary: 73 mg/dL (ref 70–99)
Glucose-Capillary: 120 mg/dL — ABNORMAL HIGH (ref 70–99)
Glucose-Capillary: 131 mg/dL — ABNORMAL HIGH (ref 70–99)
Glucose-Capillary: 129 mg/dL — ABNORMAL HIGH (ref 70–99)

## 2019-08-09 MED ORDER — INSULIN ASPART 100 UNIT/ML ~~LOC~~ SOLN
0.0000 [IU] | Freq: Three times a day (TID) | SUBCUTANEOUS | Status: DC
  Administered 2019-08-09 – 2019-08-23 (×7): 2 [IU] via SUBCUTANEOUS

## 2019-08-09 MED ORDER — METOPROLOL SUCCINATE ER 25 MG PO TB24
25.0000 mg | ORAL_TABLET | Freq: Every day | ORAL | Status: DC
  Administered 2019-08-10 – 2019-08-26 (×17): 25 mg via ORAL
  Filled 2019-08-09 (×17): qty 1

## 2019-08-09 MED ORDER — BENAZEPRIL HCL 5 MG PO TABS
10.0000 mg | ORAL_TABLET | Freq: Every day | ORAL | Status: DC
  Filled 2019-08-09 (×2): qty 2

## 2019-08-09 MED ORDER — INDOMETHACIN 50 MG PO CAPS: 50.0000 mg | ORAL_CAPSULE | Freq: Three times a day (TID) | ORAL | Status: DC | PRN

## 2019-08-09 MED ORDER — AMLODIPINE BESYLATE 5 MG PO TABS
5.0000 mg | ORAL_TABLET | Freq: Every day | ORAL | Status: DC
  Administered 2019-08-10 – 2019-08-13 (×4): 5 mg via ORAL
  Filled 2019-08-09 (×4): qty 1

## 2019-08-09 MED ORDER — ACETAMINOPHEN 650 MG RE SUPP: 650.0000 mg | RECTAL | Status: DC | PRN

## 2019-08-09 MED ORDER — SENNA 8.6 MG PO TABS
1.0000 | ORAL_TABLET | Freq: Two times a day (BID) | ORAL | Status: DC
  Administered 2019-08-09 – 2019-08-16 (×14): 8.6 mg via ORAL
  Filled 2019-08-09 (×14): qty 1

## 2019-08-09 MED ORDER — BISACODYL 10 MG RE SUPP: 10.0000 mg | Freq: Every day | RECTAL | Status: DC | PRN

## 2019-08-09 MED ORDER — AMLODIPINE BESYLATE 5 MG PO TABS: 5.0000 mg | ORAL_TABLET | Freq: Every day | ORAL | Status: DC

## 2019-08-09 MED ORDER — ONDANSETRON HCL 4 MG PO TABS: 4.0000 mg | ORAL_TABLET | Freq: Four times a day (QID) | ORAL | Status: DC | PRN

## 2019-08-09 MED ORDER — DOCUSATE SODIUM 100 MG PO CAPS
100.0000 mg | ORAL_CAPSULE | Freq: Two times a day (BID) | ORAL | Status: DC
  Administered 2019-08-09 – 2019-08-19 (×21): 100 mg via ORAL
  Filled 2019-08-09 (×22): qty 1

## 2019-08-09 MED ORDER — SCOPOLAMINE 1 MG/3DAYS TD PT72
1.0000 | MEDICATED_PATCH | TRANSDERMAL | Status: DC
  Administered 2019-08-11 – 2019-08-17 (×3): 1.5 mg via TRANSDERMAL
  Filled 2019-08-09 (×3): qty 1

## 2019-08-09 MED ORDER — BENAZEPRIL HCL 5 MG PO TABS
10.0000 mg | ORAL_TABLET | Freq: Every day | ORAL | Status: DC
  Administered 2019-08-10 – 2019-08-26 (×17): 10 mg via ORAL
  Filled 2019-08-09 (×18): qty 2

## 2019-08-09 MED ORDER — ACETAMINOPHEN 325 MG PO TABS
650.0000 mg | ORAL_TABLET | ORAL | Status: DC | PRN
  Filled 2019-08-09: qty 2

## 2019-08-09 MED ORDER — ONDANSETRON HCL 4 MG/2ML IJ SOLN: 4.0000 mg | Freq: Four times a day (QID) | INTRAMUSCULAR | Status: DC | PRN

## 2019-08-09 MED ORDER — METHOCARBAMOL 1000 MG/10ML IJ SOLN
500.0000 mg | Freq: Four times a day (QID) | INTRAVENOUS | Status: DC | PRN
  Filled 2019-08-09: qty 5

## 2019-08-09 MED ORDER — BRIMONIDINE TARTRATE 0.2 % OP SOLN
1.0000 [drp] | Freq: Three times a day (TID) | OPHTHALMIC | Status: DC
  Administered 2019-08-09 – 2019-08-26 (×50): 1 [drp] via OPHTHALMIC
  Filled 2019-08-09 (×2): qty 5

## 2019-08-09 MED ORDER — METHOCARBAMOL 500 MG PO TABS
500.0000 mg | ORAL_TABLET | Freq: Four times a day (QID) | ORAL | Status: DC | PRN
  Administered 2019-08-10: 07:00:00 500 mg via ORAL
  Filled 2019-08-09 (×2): qty 1

## 2019-08-09 MED ORDER — PREDNISOLONE ACETATE 1 % OP SUSP
1.0000 [drp] | Freq: Two times a day (BID) | OPHTHALMIC | Status: DC
  Administered 2019-08-09 – 2019-08-26 (×34): 1 [drp] via OPHTHALMIC
  Filled 2019-08-09: qty 5

## 2019-08-09 MED ORDER — TIMOLOL MALEATE 0.5 % OP SOLN
1.0000 [drp] | Freq: Two times a day (BID) | OPHTHALMIC | Status: DC
  Administered 2019-08-09 – 2019-08-26 (×34): 1 [drp] via OPHTHALMIC
  Filled 2019-08-09: qty 5

## 2019-08-09 MED ORDER — SORBITOL 70 % SOLN: 30.0000 mL | Freq: Every day | Status: DC | PRN

## 2019-08-09 MED ORDER — ATORVASTATIN CALCIUM 10 MG PO TABS: 20.0000 mg | ORAL_TABLET | Freq: Every day | ORAL | Status: DC

## 2019-08-09 MED ORDER — ASPIRIN EC 81 MG PO TBEC
81.0000 mg | DELAYED_RELEASE_TABLET | Freq: Every day | ORAL | Status: DC
  Administered 2019-08-12 – 2019-08-26 (×15): 81 mg via ORAL
  Filled 2019-08-09 (×15): qty 1

## 2019-08-09 MED ORDER — ASPIRIN EC 81 MG PO TBEC: 81.0000 mg | DELAYED_RELEASE_TABLET | Freq: Every day | ORAL | Status: AC

## 2019-08-09 MED ORDER — METFORMIN HCL ER 500 MG PO TB24
500.0000 mg | ORAL_TABLET | Freq: Every day | ORAL | Status: DC
  Administered 2019-08-09 – 2019-08-25 (×18): 500 mg via ORAL
  Filled 2019-08-09 (×17): qty 1

## 2019-08-09 MED ORDER — METOPROLOL SUCCINATE ER 25 MG PO TB24: 25.0000 mg | ORAL_TABLET | Freq: Every day | ORAL | Status: DC

## 2019-08-09 MED ORDER — ATORVASTATIN CALCIUM 10 MG PO TABS
20.0000 mg | ORAL_TABLET | Freq: Every day | ORAL | Status: DC
  Administered 2019-08-10 – 2019-08-26 (×17): 20 mg via ORAL
  Filled 2019-08-09 (×17): qty 2

## 2019-08-09 MED ORDER — HYDROCODONE-ACETAMINOPHEN 5-325 MG PO TABS
1.0000 | ORAL_TABLET | ORAL | Status: DC | PRN
  Administered 2019-08-09 – 2019-08-26 (×31): 1 via ORAL
  Filled 2019-08-09 (×31): qty 1

## 2019-08-09 NOTE — Progress Notes (Signed)
  NEUROSURGERY PROGRESS NOTE   No issues overnight.  No concerns this am Denies new N/T/W  EXAM:  BP (!) 165/73 (BP Location: Right Arm)   Pulse 65   Temp 98.6 F (37 C)   Resp 20   Ht 5\' 10"  (1.778 m)   Wt 73.9 kg   SpO2 100%   BMI 23.39 kg/m   Awake, alert, oriented  Speech fluent, appropriate  CN grossly intact  Stable motor Incision c/d/i  IMPRESSION/PLAN 64 y.o. male Pod #4 C3-4, C4-5, C5-6, C6-7 ACDF for cervical stenosis with myelopathy. Stable neurologically. - CIR candidate. Awaiting insurance approval. Cleared for d/c when bed available

## 2019-08-09 NOTE — Progress Notes (Signed)
Pt admitted to room 4M02-01. Oriented to floor, soft touch call bell and rehab fall policy. C/o pain on neck and shoulders, left shoulder >right. Administered PRN pain med. Pt currently in bed resting with sister at bedside.   Marylu Lund, RN

## 2019-08-09 NOTE — Discharge Summary (Signed)
Physician Discharge Summary  Patient ID: Brian Daniels MRN: [id number] DOB/AGE: [birth year] 64 y.o.  Admit date: 08/05/2019 Discharge date: 08/09/2019  Admission Diagnoses:  cervial stenosis with myelopathy  Discharge Diagnoses:  Same Active Problems:   Stenosis of cervical spine with myelopathy [facility])   Discharged Condition: Stable  Hospital Course:  Brian Daniels is a 64 y.o. male who was admitted for the below procedure. There were no post operative complications. At time of discharge, pain was well controlled, ambulating with Pt/OT, tolerating po, voiding normal. Ready for discharge.  Treatments: Surgery 1. Discectomy at C3-4, C4-5, C5-6, C6-7 for decompression of spinal cord and exiting nerve roots  2. Placement of intervertebral biomechanical device:             Medtronic Titan 32mm lordotic @ C3-4             Medtronic Titan 28mm lordotic @ C4-5             Medtronic Titan 12mm lordotic @ C5-6             Medtronic Titan 58mm lordotic @ C6-7            3. Placement of anterior instrumentation consisting of interbody plate and screws - Medtronic Atlantis 53mm plate  4. Use of morselized bone allograft  5. Arthrodesis C3-4, C4-5, C5-6, C6-7, anterior interbody technique  6. Use of intraoperative microscope   Discharge Exam: Blood pressure (!) 165/73, pulse 65, temperature 98.6 F (37 C), resp. rate 20, height 5\' 10"  (1.778 m), weight 73.9 kg, SpO2 100 %. Awake, alert, oriented Speech fluent, appropriate CN grossly intact, legally blind Stable motor/sensory Wound c/d/i  Disposition: Discharge disposition: [place] Not Defined       Discharge Instructions    Call MD for:  difficulty breathing, headache or visual disturbances   Complete by: As directed    Call MD for:  persistant dizziness or light-headedness   Complete by: As directed    Call MD for:  redness, tenderness, or signs of infection (pain, swelling, redness, odor or  green/yellow discharge around incision site)   Complete by: As directed    Call MD for:  severe uncontrolled pain   Complete by: As directed    Call MD for:  temperature >100.4   Complete by: As directed    Diet general   Complete by: As directed    Driving Restrictions   Complete by: As directed    Do not drive until given clearance.   Increase activity slowly   Complete by: As directed    Lifting restrictions   Complete by: As directed    Do not lift anything >10lbs. Avoid bending and twisting in awkward positions. Avoid bending at the back.   May shower / Bathe   Complete by: As directed    In 24 hours. Okay to wash wound with warm soapy water. Avoid scrubbing the wound. Pat dry.   Remove dressing in 24 hours   Complete by: As directed      Allergies as of 08/09/2019   No Known Allergies     Medication List    TAKE these medications   amLODipine 5 MG tablet Commonly known as: NORVASC Take 5 mg by mouth daily.   aspirin EC 81 MG tablet Take 1 tablet (81 mg total) by mouth daily. Start taking on: August 12, 2019 What changed: These instructions start on August 12, 2019. If you are unsure what to do until then, ask  your doctor or other care provider.   atorvastatin 20 MG tablet Commonly known as: LIPITOR Take 20 mg by mouth daily.   B-12 PO Take 5,000 mcg by mouth daily.   benazepril 10 MG tablet Commonly known as: LOTENSIN Take 10 mg by mouth daily.   BL CALCIUM-MAGNESIUM-ZINC PO Take 1 tablet by mouth daily.   brimonidine 0.2 % ophthalmic solution Commonly known as: ALPHAGAN Place 1 drop into both eyes 3 (three) times daily.   Fish Oil 1000 MG Caps Take 1,000 mg by mouth daily.   GINSENG PO Take 2 tablets by mouth daily as needed (energy boost).   indomethacin 50 MG capsule Commonly known as: INDOCIN Take 1 capsule (50 mg total) by mouth 3 (three) times daily as needed for moderate pain. Start taking on: August 12, 2019 What changed: These  instructions start on August 12, 2019. If you are unsure what to do until then, ask your doctor or other care provider.   metFORMIN 500 MG 24 hr tablet Commonly known as: GLUCOPHAGE-XR Take 500 mg by mouth at bedtime.   metoprolol succinate 25 MG 24 hr tablet Commonly known as: TOPROL-XL Take 25 mg by mouth daily.   OVER THE COUNTER MEDICATION Take 1 tablet by mouth daily. Super Beta Prostate   prednisoLONE acetate 1 % ophthalmic suspension Commonly known as: PRED FORTE Place 1 drop into the right eye 2 (two) times daily.   timolol 0.5 % ophthalmic solution Commonly known as: BETIMOL Place 1 drop into both eyes 2 (two) times daily.   VITAMIN C PO Take 1,000 mg by mouth daily.   vitamin E 180 MG (400 UNITS) capsule Take 180 mg by mouth daily.      Follow-up Information    Lisbeth Renshaw, MD Follow up.   Specialty: Neurosurgery Contact information: [address] [place] [address] [phone]           Signed: Alyson Ingles 08/09/2019, 10:26 AM

## 2019-08-09 NOTE — Progress Notes (Signed)
Inpatient Rehabilitation Admissions Coordinator  Insurance has approved and CIR bed is available to admit today. I met with patient at bedside with his sister, [name], on speaker phone. Both in agreement to admit today. RN CM, Kristi and [place] PA made aware. I will make the arrangements to admit today.  Danne Baxter, RN, MSN Rehab Admissions Coordinator [phone] 08/09/2019 12:57 PM

## 2019-08-09 NOTE — Progress Notes (Signed)
Physical Therapy Treatment Patient Details Name: Brian Daniels MRN: [id number] DOB: 04-30-1956 Today's Date: 08/09/2019    History of Present Illness 64 y.o. male who was found to have multilevel mod-severe cervical stenosis with myelopathy (primarily left-sided numbness and tingling and weakness) and w/c bound since 05/30/19 pt fell and weakness started (slipped while walking). MRI also showed signal change in spinal cord at C3-4 level. 08/05/19 underwent ACDF C3-7.   PMH-lumbar pain, DM, bil glaucoma legally blind, HTN,    PT Comments    Pt agreeable to participation.  Looking forward to getting going on rehab.  Emphasis on transitions, sitting balance, symmetrical standing and scooting laterally on the bed..    Follow Up Recommendations  CIR;Supervision/Assistance - 24 hour     Equipment Recommendations  Rolling walker with 5" wheels    Recommendations for Other Services       Precautions / Restrictions Precautions Precautions: Fall Precaution Comments: pt legally blind in both eyes Required Braces or Orthoses: Cervical Brace Cervical Brace: Hard collar Restrictions Weight Bearing Restrictions: No    Mobility  Bed Mobility Overal bed mobility: Needs Assistance Bed Mobility: Rolling;Sidelying to Sit;Sit to Sidelying Rolling: Mod assist Sidelying to sit: Mod assist     Sit to sidelying: Mod assist General bed mobility comments: cues for technique, truncal assist to roll and transition up via left elbow.  pt still mildly retropulsive and listing right.  Transfers Overall transfer level: Needs assistance Equipment used: Rolling walker (2 wheeled)(and face to face assist) Transfers: Sit to/from Stand;Lateral/Scoot Transfers Sit to Stand: Max assist        Lateral/Scoot Transfers: Mod assist(several up toward [facility]) General transfer comment: cues for hand placement.  With 1 person assist, pt could not get a good sense of his environment and could not stand upright.  With  face to face assist, pt could orient and stand with significant personal effort.  Ambulation/Gait             General Gait Details: unable with 1 person   Stairs             Wheelchair Mobility    Modified Rankin (Stroke Patients Only)       Balance Overall balance assessment: Needs assistance Sitting-balance support: Single extremity supported;No upper extremity supported Sitting balance-Leahy Scale: Fair Sitting balance - Comments: verbal cues to attain midline upright posture, pt with R lateral/posterior lean Postural control: Right lateral lean;Posterior lean Standing balance support: Single extremity supported;Bilateral upper extremity supported Standing balance-Leahy Scale: Poor Standing balance comment: face to face assist with bilateral knee guarding                            Cognition Arousal/Alertness: Awake/alert Behavior During Therapy: WFL for tasks assessed/performed Overall Cognitive Status: Within Functional Limits for tasks assessed                                        Exercises      General Comments General comments (skin integrity, edema, etc.): Reinforced cervical precautions      Pertinent Vitals/Pain Pain Assessment: Faces Faces Pain Scale: Hurts little more Pain Location: neck Pain Descriptors / Indicators: Guarding Pain Intervention(s): Monitored during session    Home Living                      Prior Function  PT Goals (current goals can now be found in the care plan section) Acute Rehab PT Goals Patient Stated Goal: return home PT Goal Formulation: With patient Time For Goal Achievement: 08/20/19 Potential to Achieve Goals: Good Progress towards PT goals: Progressing toward goals    Frequency    Min 5X/week      PT Plan Current plan remains appropriate    Co-evaluation              AM-PAC PT "6 Clicks" Mobility   Outcome Measure  Help needed turning  from your back to your side while in a flat bed without using bedrails?: A Lot Help needed moving from lying on your back to sitting on the side of a flat bed without using bedrails?: A Lot Help needed moving to and from a bed to a chair (including a wheelchair)?: Total Help needed standing up from a chair using your arms (e.g., wheelchair or bedside chair)?: Total Help needed to walk in hospital room?: Total Help needed climbing 3-5 steps with a railing? : Total 6 Click Score: 8    End of Session Equipment Utilized During Treatment: Cervical collar Activity Tolerance: Patient tolerated treatment well Patient left: in bed;with call bell/phone within reach Nurse Communication: Mobility status;Other (comment) PT Visit Diagnosis: Other abnormalities of gait and mobility (R26.89);Muscle weakness (generalized) (M62.81);Other symptoms and signs involving the nervous system (R29.898)     Time: 4401-0272 PT Time Calculation (min) (ACUTE ONLY): 39 min  Charges:  $Therapeutic Exercise: 8-22 mins $Therapeutic Activity: 8-22 mins $Neuromuscular Re-education: 8-22 mins                     08/09/2019  Ginger Carne., PT Acute Rehabilitation Services [phone]  (pager) [phone]  (office)   Tessie Fass [place] 08/09/2019, 3:22 PM

## 2019-08-09 NOTE — H&P (Signed)
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  Per chart review patient lives with his sister.  1 level home no steps to entry.  By report patient has been essentially wheelchair-bound since 05/30/2019.  He denies any bowel or bladder disturbances.  Patient has been followed outpatient neurology for gait abnormality.  Presented 08/05/2019 to the ED accompanied by his sister with inability to get out of his chair after recent fall and reported bilateral upper and lower body weakness with paresthesias left greater than right that has progressed over the last 2 months.  Cranial CT scan 05/30/2019 showed no acute intracranial abnormality and MRI of the brain 06/27/2019 moderate perisylvian atrophy and moderate ventriculomegaly.  Moderate to severe spinal stenosis at C3-4 noted partially on sagittal views.  No acute findings noted...  Recent MRI cervical spine demonstrates significant multilevel stenosis from C3-4 through C6-7 including T2 signal change in the spinal cord.  Recent MRI that was completed of the brain although it did show some ventriculomegaly symptoms most consistent with cervical myelopathy rather than normal pressure hydrocephalus after being reviewed by neurosurgery.  Patient underwent discectomy at C3-4, 4-5, 5-6, C6-7 for decompression of spinal cord and exiting nerve roots placement of intervertebral biomechanical device/placement of anterior instrumentation consisting of interbody plate and screws as well as arthrodesis 08/05/2019 per Dr. Conchita ParisNundkumar.  Hard cervical collar when out of bed.[name] Kitchen.  Hospital course pain management.  Tolerating a regular consistency diet.  Therapy evaluations completed with recommendations of physical medicine rehab consult.  Patient was admitted for a comprehensive rehab program.   Pt  reports had spasticity/muscle spasms since December or so- very slightly better since surgery; also has nerve pain in hands and feet- sometimes works its way proximally on arms and legs- tingling/burning. Thought these Sx's were "normal" for his situation.  Feels throbbing/aching pain is well controlled with current meds.  Required in/out cath x1-2 times a few days ago after surgery but not since- not sure if they've scanned bladder since initially, but has been voding "ok" with Condom catheter- also was feeling constipated- small BM this AM and large one just now.   Review of Systems  Constitutional: Negative for fever.  HENT: Negative for hearing loss.   Eyes: Negative for blurred vision and double vision.       Patient is legally blind  Respiratory: Negative for cough and shortness of breath.   Cardiovascular: Negative for chest pain, palpitations and leg swelling.  Gastrointestinal: Positive for constipation. Negative for heartburn, nausea and vomiting.  Genitourinary: Positive for urgency. Negative for dysuria, flank pain and hematuria.  Musculoskeletal: Positive for back pain, joint pain and myalgias.       Patient with reported recent fall.  Skin: Negative for rash.  Neurological: Positive for tingling, sensory change and weakness.  All other systems reviewed and are negative.  Past Medical History:  Diagnosis Date  . Cataracts, bilateral    removed by surgery  . Diabetes mellitus without complication (HCC)    type 2  . Glaucoma    bilateral  . Gout   . Hypercholesteremia   . Hypertension   . Left atrial enlargement   . Legally blind    bilateral  . Neuropathy    hands, arm  . Numbness    hands, arms  . Wears partial dentures    lower  .  Wheelchair bound    since 05/30/19   Past Surgical History:  Procedure Laterality Date  . ANTERIOR CERVICAL DECOMPRESSION/DISCECTOMY FUSION 4 LEVELS N/A 08/05/2019   Procedure: ANTERIOR CERVICAL DECOMPRESSION/DISCECTOMY FUSION  CERVICAL THREE- CERVICAL FOUR, CERVICAL FOUR- CERVICAL FIVE, CERVICAL FIVE- CERVICAL SIX, CERVICAL SIX- CERVICAL SEVEN;  Surgeon: Consuella Lose, MD;  Location: [facility];  Service: Neurosurgery;  Laterality: N/A;  anterior  . COLONOSCOPY    . EYE SURGERY Bilateral    multiple surgeries bilateral -   . MULTIPLE TOOTH EXTRACTIONS    . WISDOM TOOTH EXTRACTION     Family History  Problem Relation Age of Onset  . Hyperlipidemia Mother   . Diabetes Mother   . Hypertension Father    Social History:  reports that he has never smoked. He has never used smokeless tobacco. He reports previous alcohol use. He reports that he does not use drugs. Allergies: No Known Allergies Quit smoking at age 73- never "big smoker" just when drinking; quit drinking at age 31- no illicits; lives in 1 story house with 1/2 step to enter with sister- moved in with her when became blind- physically/medically retired due to blindness.   Medications Prior to Admission  Medication Sig Dispense Refill  . amLODipine (NORVASC) 5 MG tablet Take 5 mg by mouth daily.    . Ascorbic Acid (VITAMIN C PO) Take 1,000 mg by mouth daily.     [name] Kitchen atorvastatin (LIPITOR) 20 MG tablet Take 20 mg by mouth daily.    . benazepril (LOTENSIN) 10 MG tablet Take 10 mg by mouth daily.    . BL CALCIUM-MAGNESIUM-ZINC PO Take 1 tablet by mouth daily.    . brimonidine (ALPHAGAN) 0.2 % ophthalmic solution Place 1 drop into both eyes 3 (three) times daily.    . Cyanocobalamin (B-12 PO) Take 5,000 mcg by mouth daily.     [name] Kitchen GINSENG PO Take 2 tablets by mouth daily as needed (energy boost).    . indomethacin (INDOCIN) 50 MG capsule Take 50 mg by mouth 3 (three) times daily as needed for moderate pain.     . metFORMIN (GLUCOPHAGE-XR) 500 MG 24 hr tablet Take 500 mg by mouth at bedtime.     . metoprolol succinate (TOPROL-XL) 25 MG 24 hr tablet Take 25 mg by mouth daily.    . Omega-3 Fatty Acids (FISH OIL) 1000 MG CAPS Take 1,000 mg by mouth daily.     [name] Kitchen OVER  THE COUNTER MEDICATION Take 1 tablet by mouth daily. Super Beta Prostate    . prednisoLONE acetate (PRED FORTE) 1 % ophthalmic suspension Place 1 drop into the right eye 2 (two) times daily.    . timolol (BETIMOL) 0.5 % ophthalmic solution Place 1 drop into both eyes 2 (two) times daily.    . vitamin E 180 MG (400 UNITS) capsule Take 180 mg by mouth daily.     [name] Kitchen aspirin EC 81 MG tablet Take 81 mg by mouth daily.       Drug Regimen Review Drug regimen was reviewed and remains appropriate with no significant issues identified  Home: Home Living Family/patient expects to be discharged to:: Private residence Living Arrangements: (lives with hi sister for the past year. From [place], [place]) Available Help at Discharge: Family, Available 24 hours/day Type of Home: House Home Access: Stairs to enter [employer] of Steps: 1 Entrance Stairs-Rails: None Home Layout: One level Bathroom Shower/Tub: Chiropodist: Handicapped height Bathroom Accessibility: Yes Home Equipment: Wheelchair - manual  Lives  With: Family(sister, [name])   Functional History: Prior Function Level of Independence: Needs assistance Gait / Transfers Assistance Needed: transfer with sister's assist (sounds like squat-pivot over armrest); prior to 11/16 was walking; w/c bound with sister pushing him since 11/16 ADL's / Homemaking Assistance Needed: sister assist with ADLs but has progress since fall and IADLS she completed  Functional Status:  Mobility: Bed Mobility Overal bed mobility: Needs Assistance Bed Mobility: Rolling, Sidelying to Sit Rolling: Mod assist Sidelying to sit: Mod assist, +2 for physical assistance General bed mobility comments: pt very retropulsive, requiring max verbal and tactile cues to complete task, modA for trunk elevation Transfers Overall transfer level: Needs assistance Equipment used: Rolling walker (2 wheeled) Transfers: Sit to/from Stand Sit to Stand: Mod  assist, +2 physical assistance, +2 safety/equipment, From elevated surface General transfer comment: Bed elevated, modA +2 for power upl R knee blocked; pt with posterior lean and R lateral lean with multimodal cues to fix posture Ambulation/Gait Ambulation/Gait assistance: Mod assist, +2 safety/equipment Gait Distance (Feet): 25 Feet Assistive device: 2 person hand held assist Gait Pattern/deviations: Decreased stride length, Step-through pattern, Narrow base of support General Gait Details: pt request to have bilat HHA due to vision impairment. pt with ataxic like gait pattern with near scissoring/cross over, pt very dependent on bilat HHA, pt began to become shaky due to fatigue Gait velocity: slow Gait velocity interpretation: <1.8 ft/sec, indicate of risk for recurrent falls    ADL: ADL Overall ADL's : Needs assistance/impaired Eating/Feeding: Set up, Sitting Grooming: Wash/dry face, Minimal assistance, Sitting Upper Body Bathing: Moderate assistance, Cueing for safety, Cueing for sequencing, Sitting Lower Body Bathing: Maximal assistance, Sit to/from stand, Cueing for safety, Cueing for sequencing Upper Body Dressing : Total assistance Upper Body Dressing Details (indicate cue type and reason): donning cervical collar Lower Body Dressing: Maximal assistance, Sit to/from stand Toilet Transfer: Maximal assistance, +2 for physical assistance, +2 for safety/equipment, Cueing for safety Toileting- Clothing Manipulation and Hygiene: Maximal assistance, Cueing for safety, Cueing for sequencing Tub/ Shower Transfer: +2 for physical assistance, +2 for safety/equipment, Moderate assistance Functional mobility during ADLs: Moderate assistance, +2 for physical assistance, +2 for safety/equipment, Cueing for safety, Rolling walker General ADL Comments: Pt limited by pain, decreased ability to care for self and decreased strength with low vision at baseline in a new environment.  Cognition:  Cognition Overall Cognitive Status: Within Functional Limits for tasks assessed Orientation Level: Oriented X4 Cognition Arousal/Alertness: Awake/alert Behavior During Therapy: WFL for tasks assessed/performed Overall Cognitive Status: Within Functional Limits for tasks assessed General Comments: Pt following all commadns and asked to repeat instructions when unsure of what was asked  Physical Exam: Blood pressure (!) 144/84, pulse 93, temperature (!) 97.5 F (36.4 C), temperature source Oral, resp. rate 20, height 5\' 10"  (1.778 m), weight 73.9 kg, SpO2 92 %. Physical Exam  Nursing note and vitals reviewed. Constitutional: He appears well-developed and well-nourished.  Wearing dark glasses; laying in bed listening to TV/news, NAD  HENT:  Head: Normocephalic and atraumatic.  Didn't want to take glasses off to assess eyes due to light sensitivity, however face symmetry equal and sensation intact  Eyes: Right eye exhibits no discharge. Left eye exhibits no discharge.  Didn't want to take sunglasses off to assess due to light sensitivity.  Neck:  ACDF incision site clean and dry with honeycomb dressing on R anterior neck.  Decreased cervical rotation  Cardiovascular:  RRR  Respiratory:  CTA B/L no W/R/R  GI:  Soft, NT,  ND, hypoactive BS  Genitourinary:    Genitourinary Comments: Condom catheter in place   Musculoskeletal:     Cervical back: Neck supple.     Comments: RUE- bicep 4/5, WE 4/5, triceps 4+/5, grip 4-/5, finger 4+/5 LUE- Biceps 2+/5, WE 2+/5, triceps 2+/5, grip 3/5, finger 3/5  RLE- HF 4-/5, KE 4/5, DF 4=/5, PF 4-/5 LLE- HF 2/5, KE 2/5, DF 2/5, PF 2-/5   Neurological:  Patient is alert in no acute distress sitting up in bed.  He does follow simple commands.  Provides his name age in place.  Patient is legally blind which did limit overall examination. Hoffman's LUE- strong; (-) Hoffman's in RUE no clonus in LEs B/L Sensation intact at C3 B/L; otherwise decreased at  every dermatome to  L3 B/L ; L4-S2 dermatomes B/L very decreased sensation- same S3-S5 B/L  Skin:  IV in R hand- no signs of infiltration No signs of skin breakdown seen except incision on neck  Psychiatric:  Very appropriate and pleasant    Results for orders placed or performed during the hospital encounter of 08/05/19 (from the past 48 hour(s))  Glucose, capillary     Status: None   Collection Time: 08/07/19  8:53 AM  Result Value Ref Range   Glucose-Capillary 78 70 - 99 mg/dL  Glucose, capillary     Status: Abnormal   Collection Time: 08/07/19  3:18 PM  Result Value Ref Range   Glucose-Capillary 108 (H) 70 - 99 mg/dL  Glucose, capillary     Status: Abnormal   Collection Time: 08/07/19  5:15 PM  Result Value Ref Range   Glucose-Capillary 110 (H) 70 - 99 mg/dL  Glucose, capillary     Status: Abnormal   Collection Time: 08/07/19  9:43 PM  Result Value Ref Range   Glucose-Capillary 111 (H) 70 - 99 mg/dL  Glucose, capillary     Status: Abnormal   Collection Time: 08/08/19  9:40 AM  Result Value Ref Range   Glucose-Capillary 126 (H) 70 - 99 mg/dL  Glucose, capillary     Status: Abnormal   Collection Time: 08/08/19 11:05 AM  Result Value Ref Range   Glucose-Capillary 118 (H) 70 - 99 mg/dL  Glucose, capillary     Status: None   Collection Time: 08/08/19  3:52 PM  Result Value Ref Range   Glucose-Capillary 81 70 - 99 mg/dL  Glucose, capillary     Status: Abnormal   Collection Time: 08/08/19  9:32 PM  Result Value Ref Range   Glucose-Capillary 106 (H) 70 - 99 mg/dL  Glucose, capillary     Status: Abnormal   Collection Time: 08/08/19  9:59 PM  Result Value Ref Range   Glucose-Capillary 102 (H) 70 - 99 mg/dL   No results found.     Medical Problem List and Plan: 1.  Bilateral upper and lower extremity weakness with paresthesias left greater than right with gait disturbance secondary to cervical myelopathy.S/P multilevel discectomy C3-4, 4-5, 5-6 and C6-7 with decompression  of spinal cord and exiting nerve roots with arthrodesis 08/05/2019 per Dr. Conchita Paris.  HARD Cervical collar when out of bed.  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   -patient may  Shower if covers neck incision  -ELOS/Goals:  Supervision to Min assist - 3-3.5 weeks due to SCI 2.  Antithrombotics: -DVT/anticoagulation: SCDs.  Discuss Lovenox for DVT prophylaxis with neurosurgery.  Check vascular study  -antiplatelet therapy: Aspirin 81 mg to begin 08/12/2019 3. Pain Management: Post  op pain, nerve pain and spasticity- Hydrocodone/Robaxin as needed  -will try and change to Baclofen for muscle spasms/spasticity once gets to CIR and possibly add Duloxetine for nerve pain 4. Mood: Provide emotional support  -antipsychotic agents: N/A 5. Neuropsych: This patient is capable of making decisions on his own behalf. 6. Skin/Wound Care: Routine skin checks 7. Fluids/Electrolytes/Nutrition: Routine in and outs with follow-up chemistries 8.  Hypertension.  Toprol-XL 25 mg daily, Lotensin 10 mg daily, Norvasc 5 mg daily.  Monitor with increased mobility 9.  Diabetes mellitus.  Hemoglobin A1c 6.3.  Glucophage 500 mg daily.  Check blood sugars before meals and at bedtime 10.  Glaucoma/legally blind.  Continue eyedrops as directed- continue pt's dark sunglasses for light sensitivity.  11.  Hyperlipidemia.  Lipitor 12.  Constipation in setting of Neurogenic bowel.  Adjust bowel program as needed. 13. Neurogenic bladder- will check PVRS to make sure is emptying since needed a in/out cath due to lack of voiding 2 days ago.   Mcarthur Rossetti Angiulli, PA-C 08/09/2019

## 2019-08-10 ENCOUNTER — Inpatient Hospital Stay (HOSPITAL_COMMUNITY): Payer: Medicare Other | Admitting: Physical Therapy

## 2019-08-10 ENCOUNTER — Inpatient Hospital Stay (HOSPITAL_COMMUNITY): Payer: Medicare Other | Admitting: Occupational Therapy

## 2019-08-10 ENCOUNTER — Inpatient Hospital Stay (HOSPITAL_COMMUNITY): Payer: Medicare Other

## 2019-08-10 DIAGNOSIS — M7989 Other specified soft tissue disorders: Secondary | ICD-10-CM

## 2019-08-10 LAB — CBC WITH DIFFERENTIAL/PLATELET
Abs Immature Granulocytes: 0.02 10*3/uL (ref 0.00–0.07)
Basophils Absolute: 0 10*3/uL (ref 0.0–0.1)
Basophils Relative: 1 %
Eosinophils Absolute: 0.3 10*3/uL (ref 0.0–0.5)
Eosinophils Relative: 4 %
HCT: 39.1 % (ref 39.0–52.0)
Hemoglobin: 12.6 g/dL — ABNORMAL LOW (ref 13.0–17.0)
Immature Granulocytes: 0 %
Lymphocytes Relative: 23 %
Lymphs Abs: 1.7 10*3/uL (ref 0.7–4.0)
MCH: 26.8 pg (ref 26.0–34.0)
MCHC: 32.2 g/dL (ref 30.0–36.0)
MCV: 83 fL (ref 80.0–100.0)
Monocytes Absolute: 0.7 10*3/uL (ref 0.1–1.0)
Monocytes Relative: 10 %
Neutro Abs: 4.8 10*3/uL (ref 1.7–7.7)
Neutrophils Relative %: 62 %
Platelets: 191 10*3/uL (ref 150–400)
RBC: 4.71 MIL/uL (ref 4.22–5.81)
RDW: 13 % (ref 11.5–15.5)
WBC: 7.6 10*3/uL (ref 4.0–10.5)
nRBC: 0 % (ref 0.0–0.2)

## 2019-08-10 LAB — GLUCOSE, CAPILLARY
Glucose-Capillary: 104 mg/dL — ABNORMAL HIGH (ref 70–99)
Glucose-Capillary: 108 mg/dL — ABNORMAL HIGH (ref 70–99)
Glucose-Capillary: 116 mg/dL — ABNORMAL HIGH (ref 70–99)
Glucose-Capillary: 110 mg/dL — ABNORMAL HIGH (ref 70–99)

## 2019-08-10 LAB — COMPREHENSIVE METABOLIC PANEL
ALT: 19 U/L (ref 0–44)
AST: 19 U/L (ref 15–41)
Albumin: 3.3 g/dL — ABNORMAL LOW (ref 3.5–5.0)
Alkaline Phosphatase: 61 U/L (ref 38–126)
Anion gap: 10 (ref 5–15)
BUN: 15 mg/dL (ref 8–23)
CO2: 27 mmol/L (ref 22–32)
Calcium: 9 mg/dL (ref 8.9–10.3)
Chloride: 103 mmol/L (ref 98–111)
Creatinine, Ser: 0.76 mg/dL (ref 0.61–1.24)
GFR calc Af Amer: >60 mL/min (ref >60)
GFR calc non Af Amer: >60 mL/min (ref >60)
Glucose, Bld: 97 mg/dL (ref 70–99)
Potassium: 3.4 mmol/L — ABNORMAL LOW (ref 3.5–5.1)
Sodium: 140 mmol/L (ref 135–145)
Total Bilirubin: 0.7 mg/dL (ref 0.3–1.2)
Total Protein: 6.8 g/dL (ref 6.5–8.1)

## 2019-08-10 MED ORDER — BACLOFEN 5 MG HALF TABLET
5.0000 mg | ORAL_TABLET | Freq: Three times a day (TID) | ORAL | Status: DC
  Administered 2019-08-10 – 2019-08-26 (×49): 5 mg via ORAL
  Filled 2019-08-10 (×49): qty 1

## 2019-08-10 MED ORDER — POTASSIUM CHLORIDE CRYS ER 20 MEQ PO TBCR
40.0000 meq | EXTENDED_RELEASE_TABLET | Freq: Two times a day (BID) | ORAL | Status: AC
  Administered 2019-08-10 (×2): 40 meq via ORAL
  Filled 2019-08-10 (×2): qty 2

## 2019-08-10 MED ORDER — DULOXETINE HCL 30 MG PO CPEP
30.0000 mg | ORAL_CAPSULE | Freq: Every day | ORAL | Status: DC
  Administered 2019-08-10 – 2019-08-14 (×5): 30 mg via ORAL
  Filled 2019-08-10 (×5): qty 1

## 2019-08-10 MED ORDER — ENOXAPARIN SODIUM 40 MG/0.4ML ~~LOC~~ SOLN
40.0000 mg | SUBCUTANEOUS | Status: DC
  Administered 2019-08-10 – 2019-08-26 (×17): 40 mg via SUBCUTANEOUS
  Filled 2019-08-10 (×17): qty 0.4

## 2019-08-10 NOTE — Progress Notes (Signed)
Inpatient Rehabilitation  Patient information reviewed and entered into eRehab system by Spero Gunnels M. Darleth Eustache, M.A., CCC/SLP, PPS Coordinator.  Information including medical coding, functional ability and quality indicators will be reviewed and updated through discharge.    

## 2019-08-10 NOTE — Progress Notes (Signed)
Brian Heys, MD  Physician  Physical Medicine and Rehabilitation  PMR Pre-admission  Signed  Date of Service:  08/08/2019  3:28 PM      Related encounter: Admission (Discharged) from 08/05/2019 in [facility]        Show:Clear all _0 Manual_1 Template_2 Copied  Added by: _3 Cristina Gong, RN_4 Brian Heys, MD  _5 Hover for details PMR Admission Coordinator Pre-Admission Assessment   Patient: Brian Daniels is an 64 y.o., male MRN: [id number] DOB: 1956-07-13 Height: _6  (177.8 cm) Weight: 73.9 kg   Insurance Information HMO: yes    PPO:      PCP:      IPA:      80/20:      OTHER:  PRIMARY: Blue Medicare      Policy#: [id number]      Subscriber: pt CM Name: Shanon      Phone#:[phone]      Fax#: [phone] Pre-Cert#: TBD approved for 7 days      Employer:  Benefits:  Phone #: [phone]     Name: 1/25 Eff. Date: 07/15/2019     Deduct: none      Out of Pocket Max: $4200      Life Max: none CIR: $335 co pay per day days 1 until 6      SNF: no co pay days 1 until 20; $184 co pay per day days 21 until 60; no co pay days 61 until 100 Outpatient: $40 per visit     Co-Pay: visits per medical neccesity Home Health: 100%      Co-Pay: visits per medical neccesity DME: 80%     Co-Pay: 20% Providers: in network  SECONDARY: none        Medicaid Application Date:       Case Manager:  Disability Application Date:       Case Worker:    The "Data Collection Information Summary" for patients in Inpatient Rehabilitation Facilities with attached "Privacy Act [place] Records" was provided and verbally reviewed with: Patient and Family   Emergency Contact Information         Contact Information     Name Relation Home Work Mobile    Javious, Hallisey Sister     [phone]         Current Medical History  Patient Admitting Diagnosis: cervical myelopathy   History of Present Illness: 64 year old male with past  medical history of legal blindness, DM type 2, gout, hypercholesteremia, HTN, left atrial enlargement, and neuropathy. Presented on 08/05/2019 with two months progressive weakness with symptoms of cervical myelopathy. MRI confirmed stenosis with T2 signal change form C3-4 through C6-7. Although he does have ventricomegaly, his symptoms are more consistent with cervical myelopathy rather than normal pressure hydrocephalus per Neurosurgery. Patient presented for anterior cervical diskectomy and fusion at C3-4, C4-5, C5-6 and C6-7 on 08/05/2019.   Postoperatively reports some neck pain and left arm numbness persists. 08/06/2019 with I and O cath due to urinary retention. Some constipation noted with scheduled stool softer and laxative for bowel regimen.    Patient's medical record from [facility]  has been reviewed by the rehabilitation admission coordinator and physician.   Past Medical History      Past Medical History:  Diagnosis Date  . Cataracts, bilateral      removed by surgery  . Diabetes mellitus without complication ([place])      type 2  . Glaucoma  bilateral  . Gout    . Hypercholesteremia    . Hypertension    . Left atrial enlargement    . Legally blind      bilateral  . Neuropathy      hands, arm  . Numbness      hands, arms  . Wears partial dentures      lower  . Wheelchair bound      since 05/30/19      Family History   family history includes Diabetes in his mother; Hyperlipidemia in his mother; Hypertension in his father.   Prior Rehab/Hospitalizations Has the patient had prior rehab or hospitalizations prior to admission? Yes   Has the patient had major surgery during 100 days prior to admission? Yes              Current Medications   Current Facility-Administered Medications:  .  0.9 %  sodium chloride infusion, , Intravenous, Continuous, Costella, Vincent J, PA-C .  0.9 %  sodium chloride infusion, 250 mL, Intravenous, Continuous, Costella, Vincent  J, PA-C, Last Rate: 1 mL/hr at 08/05/19 1623, 250 mL at 08/05/19 1623 .  acetaminophen (TYLENOL) tablet 650 mg, 650 mg, Oral, Q4H PRN **OR** acetaminophen (TYLENOL) suppository 650 mg, 650 mg, Rectal, Q4H PRN, Costella, Vincent J, PA-C .  amLODipine (NORVASC) tablet 5 mg, 5 mg, Oral, Daily, Costella, Vincent J, PA-C, 5 mg at 08/09/19 1003 .  atorvastatin (LIPITOR) tablet 20 mg, 20 mg, Oral, Daily, Costella, Vincent J, PA-C, 20 mg at 08/09/19 1003 .  benazepril (LOTENSIN) tablet 10 mg, 10 mg, Oral, Daily, Costella, Vincent J, PA-C, 10 mg at 08/09/19 1003 .  bisacodyl (DULCOLAX) suppository 10 mg, 10 mg, Rectal, Daily PRN, Costella, Vincent J, PA-C .  brimonidine (ALPHAGAN) 0.2 % ophthalmic solution 1 drop, 1 drop, Both Eyes, TID, Costella, Vincent J, PA-C, 1 drop at 08/09/19 1006 .  6 CHG cloth bath night before surgery, , , Once **AND** 6 CHG cloth bath AM of surgery, , , Once **AND** Chlorhexidine Gluconate Cloth 2 % PADS 6 each, 6 each, Topical, Once **AND** Chlorhexidine Gluconate Cloth 2 % PADS 6 each, 6 each, Topical, Once, Costella, Vincent J, PA-C .  docusate sodium (COLACE) capsule 100 mg, 100 mg, Oral, BID, Costella, Vincent J, PA-C, 100 mg at 08/09/19 1004 .  HYDROcodone-acetaminophen (NORCO/VICODIN) 5-325 MG per tablet 1 tablet, 1 tablet, Oral, Q4H PRN, Costella, Vista Mink, PA-C, 1 tablet at 08/09/19 1004 .  HYDROmorphone (DILAUDID) injection 0.5-1 mg, 0.5-1 mg, Intravenous, Q2H PRN, Costella, Vincent J, PA-C .  insulin aspart (novoLOG) injection 0-15 Units, 0-15 Units, Subcutaneous, TID WC, Costella, Vista Mink, PA-C, 2 Units at 08/08/19 [phone] .  insulin aspart (novoLOG) injection 0-5 Units, 0-5 Units, Subcutaneous, QHS, Costella, Vincent Lenna Sciara, PA-C, 2 Units at 08/05/19 2259 .  menthol-cetylpyridinium (CEPACOL) lozenge 3 mg, 1 lozenge, Oral, PRN **OR** phenol (CHLORASEPTIC) mouth spray 1 spray, 1 spray, Mouth/Throat, PRN, Costella, Vista Mink, PA-C, 1 spray at 08/08/19 2252 .  metFORMIN  (GLUCOPHAGE-XR) 24 hr tablet 500 mg, 500 mg, Oral, QHS, Costella, Vincent J, PA-C, 500 mg at 08/08/19 2201 .  methocarbamol (ROBAXIN) tablet 500 mg, 500 mg, Oral, Q6H PRN **OR** methocarbamol (ROBAXIN) 500 mg in dextrose 5 % 50 mL IVPB, 500 mg, Intravenous, Q6H PRN, Costella, Vincent J, PA-C .  metoprolol succinate (TOPROL-XL) 24 hr tablet 25 mg, 25 mg, Oral, Daily, Costella, Vincent J, PA-C, 25 mg at 08/09/19 1004 .  ondansetron (ZOFRAN) tablet 4 mg, 4 mg, Oral, Q6H  PRN **OR** ondansetron (ZOFRAN) injection 4 mg, 4 mg, Intravenous, Q6H PRN, Costella, Vincent J, PA-C .  oxyCODONE (Oxy IR/ROXICODONE) immediate release tablet 5-10 mg, 5-10 mg, Oral, Q3H PRN, Costella, Vincent J, PA-C .  prednisoLONE acetate (PRED FORTE) 1 % ophthalmic suspension 1 drop, 1 drop, Right Eye, BID, Costella, Vincent J, PA-C, 1 drop at 08/09/19 1007 .  scopolamine (TRANSDERM-SCOP) 1 MG/3DAYS 1.5 mg, 1 patch, Transdermal, Q72H, Costella, Vincent J, PA-C, 1.5 mg at 08/08/19 0559 .  senna (SENOKOT) tablet 8.6 mg, 1 tablet, Oral, BID, Costella, Vincent J, PA-C, 8.6 mg at 08/09/19 1004 .  senna-docusate (Senokot-S) tablet 1 tablet, 1 tablet, Oral, QHS PRN, Costella, Vincent J, PA-C .  sodium chloride flush (NS) 0.9 % injection 3 mL, 3 mL, Intravenous, Q12H, Costella, Vincent J, PA-C, 3 mL at 08/09/19 1006 .  sodium chloride flush (NS) 0.9 % injection 3 mL, 3 mL, Intravenous, PRN, Costella, Vincent J, PA-C .  sodium phosphate (FLEET) 7-19 GM/118ML enema 1 enema, 1 enema, Rectal, Once PRN, Costella, Vincent J, PA-C .  timolol (TIMOPTIC) 0.5 % ophthalmic solution 1 drop, 1 drop, Both Eyes, BID, Consuella Lose, MD, 1 drop at 08/09/19 1007   Patients Current Diet:     Diet Order                      Diet general           Diet regular Room service appropriate? Yes; Fluid consistency: Thin  Diet effective now                   Precautions / Restrictions Precautions Precautions: Fall Precaution Comments: pt legally  blind in both eyes Cervical Brace: Hard collar(when up/OOB) Restrictions Weight Bearing Restrictions: No    Has the patient had 2 or more falls or a fall with injury in the past year? No   Prior Activity Level Limited Community (1-2x/wk): wheelchair transfers since 05/2019. prior to that Independent   Prior Functional Level Self Care: Did the patient need help bathing, dressing, using the toilet or eating? Needed some help   Indoor Mobility: Did the patient need assistance with walking from room to room (with or without device)? Needed some help   Stairs: Did the patient need assistance with internal or external stairs (with or without device)? Needed some help   Functional Cognition: Did the patient need help planning regular tasks such as shopping or remembering to take medications? Independent   Home Assistive Devices / Equipment Home Assistive Devices/Equipment: Blood pressure cuff, CBG Meter, Dentures (specify type), Wheelchair Home Equipment: Wheelchair - manual   Prior Device Use: Indicate devices/aids used by the patient prior to current illness, exacerbation or injury? Walker and manual wheelchair   Current Functional Level Cognition   Overall Cognitive Status: Within Functional Limits for tasks assessed Orientation Level: Oriented X4 General Comments: Pt following all commadns and asked to repeat instructions when unsure of what was asked    Extremity Assessment (includes Sensation/Coordination)   Upper Extremity Assessment: Generalized weakness RUE Deficits / Details: AAROM to 90* FF, AROM elbow through digits, WFLs RUE Sensation: decreased proprioception RUE Coordination: decreased fine motor LUE Deficits / Details: AAROM to 65* in FF, AAROM elbow through hand limited due to swelling LUE Sensation: decreased proprioception LUE Coordination: decreased fine motor  Lower Extremity Assessment: Generalized weakness, Defer to PT evaluation RLE Deficits / Details: incr  flexor tone, but able to override to come to standing; knee extension grossly  3+ RLE Sensation: decreased light touch RLE Coordination: decreased gross motor LLE Deficits / Details: weaker than rt; also with incr tone; knee extension grossly 3- LLE Sensation: decreased light touch LLE Coordination: decreased gross motor     ADLs   Overall ADL's : Needs assistance/impaired Eating/Feeding: Set up, Sitting Grooming: Wash/dry face, Minimal assistance, Sitting Upper Body Bathing: Moderate assistance, Cueing for safety, Cueing for sequencing, Sitting Lower Body Bathing: Maximal assistance, Sit to/from stand, Cueing for safety, Cueing for sequencing Upper Body Dressing : Total assistance Upper Body Dressing Details (indicate cue type and reason): donning cervical collar Lower Body Dressing: Maximal assistance, Sit to/from stand Toilet Transfer: Maximal assistance, +2 for physical assistance, +2 for safety/equipment, Cueing for safety Toileting- Clothing Manipulation and Hygiene: Maximal assistance, Cueing for safety, Cueing for sequencing Tub/ Shower Transfer: +2 for physical assistance, +2 for safety/equipment, Moderate assistance Functional mobility during ADLs: Moderate assistance, +2 for physical assistance, +2 for safety/equipment, Cueing for safety, Rolling walker General ADL Comments: Pt limited by pain, decreased ability to care for self and decreased strength with low vision at baseline in a new environment.     Mobility   Overal bed mobility: Needs Assistance Bed Mobility: Rolling, Sidelying to Sit Rolling: Mod assist Sidelying to sit: Mod assist, +2 for physical assistance General bed mobility comments: pt very retropulsive, requiring max verbal and tactile cues to complete task, modA for trunk elevation     Transfers   Overall transfer level: Needs assistance Equipment used: Rolling walker (2 wheeled) Transfers: Sit to/from Stand Sit to Stand: Mod assist, +2 physical assistance,  +2 safety/equipment, From elevated surface General transfer comment: Bed elevated, modA +2 for power upl R knee blocked; pt with posterior lean and R lateral lean with multimodal cues to fix posture     Ambulation / Gait / Stairs / Wheelchair Mobility   Ambulation/Gait Ambulation/Gait assistance: Mod assist, +2 safety/equipment Gait Distance (Feet): 25 Feet Assistive device: 2 person hand held assist Gait Pattern/deviations: Decreased stride length, Step-through pattern, Narrow base of support General Gait Details: pt request to have bilat HHA due to vision impairment. pt with ataxic like gait pattern with near scissoring/cross over, pt very dependent on bilat HHA, pt began to become shaky due to fatigue Gait velocity: slow Gait velocity interpretation: <1.8 ft/sec, indicate of risk for recurrent falls     Posture / Balance Dynamic Sitting Balance Sitting balance - Comments: verbal cues to attain midline upright posture, pt with R lateral/posterior lean Balance Overall balance assessment: Needs assistance Sitting-balance support: Bilateral upper extremity supported, Feet supported Sitting balance-Leahy Scale: Poor Sitting balance - Comments: verbal cues to attain midline upright posture, pt with R lateral/posterior lean Postural control: Right lateral lean, Posterior lean Standing balance support: Bilateral upper extremity supported Standing balance-Leahy Scale: Poor     Special needs/care consideration BiPAP/CPAP  CPM  Continuous Drip IV  Dialysis         Life Vest  Oxygen  Special Bed  Trach Size  Wound Vac  Skin  Surgical incision Bowel mgmt: constipation Bladder mgmt: external catheter Diabetic mgmt: DM type 2 Behavioral consideration  Chemo/radiation  Designated visitor is sister, [name] LEGALLY BLIND    Previous Home Environment  Living Arrangements: (lives with hi sister for the past year. From [place], [place])  Lives With: Family(sister, [name]) Available Help at  Discharge: Family, Available 24 hours/day Type of Home: House Home Layout: One level Home Access: Stairs to enter Entrance Stairs-Rails: None Entrance Stairs-Number of Steps: 1 Bathroom  Shower/Tub: Chiropodist: Handicapped height Bathroom Accessibility: Yes How Accessible: Accessible via wheelchair Platinum: No   Discharge Living Setting Plans for Discharge Living Setting: Lives with (comment)(sister, [name]) Type of Home at Discharge: House Discharge Home Layout: One level Discharge Home Access: Stairs to enter Entrance Stairs-Rails: None Entrance Stairs-Number of Steps: 1 Discharge Bathroom Shower/Tub: Tub/shower unit, Curtain Discharge Bathroom Toilet: Handicapped height Does the patient have any problems obtaining your medications?: No   Social/Family/Support Systems Contact Information: [name], sister Anticipated Caregiver: sister Anticipated Ambulance person Information: [phone] Ability/Limitations of Caregiver: no limits Caregiver Availability: 24/7 Discharge Plan Discussed with Primary Caregiver: Yes Is Caregiver In Agreement with Plan?: Yes Does Caregiver/Family have Issues with Lodging/Transportation while Pt is in Rehab?: No   Goals/Additional Needs Patient/Family Goal for Rehab: supervision PT, supervision to min OT Expected length of stay: ELOS 10 to 14 days Special Service Needs: Legally Blind Pt/Family Agrees to Admission and willing to participate: Yes Program Orientation Provided & Reviewed with Pt/Caregiver Including Roles  & Responsibilities: Yes   Decrease burden of Care through IP rehab admission:    Possible need for SNF placement upon discharge:    Patient Condition: I have reviewed medical records from [facility] , spoken with  patient and family member. I met with patient at the bedside for inpatient rehabilitation assessment.  Patient will benefit from ongoing PT and OT, can actively participate in 3  hours of therapy a day 5 days of the week, and can make measurable gains during the admission.  Patient will also benefit from the coordinated team approach during an Inpatient Acute Rehabilitation admission.  The patient will receive intensive therapy as well as Rehabilitation physician, nursing, social worker, and care management interventions.  Due to bladder management, bowel management, safety, skin/wound care, disease management, medication administration, pain management and patient education the patient requires 24 hour a day rehabilitation nursing.  The patient is currently min to mod assist with mobility and basic ADLs.  Discharge setting and therapy post discharge at home with home health is anticipated.  Patient has agreed to participate in the Acute Inpatient Rehabilitation Program and will admit today.   Preadmission Screen Completed By:  Cleatrice Burke, 08/09/2019 1:01 PM ______________________________________________________________________   Discussed status with Dr. Dagoberto Ligas on  08/09/2019 at  1305 and received approval for admission today.   Admission Coordinator:  Cleatrice Burke, RN, time  1275 Date  08/09/2019    Assessment/Plan: Diagnosis: 1. Does the need for close, 24 hr/day Medical supervision in concert with the patient's rehab needs make it unreasonable for this patient to be served in a less intensive setting? Yes 2. Co-Morbidities requiring supervision/potential complications: Cervical mylopathy/SCI, legal blindness due to glaucoma, HTN, HLD, DM 3. Due to bladder management, bowel management, safety, skin/wound care, disease management, medication administration, pain management and patient education, does the patient require 24 hr/day rehab nursing? Yes 4. Does the patient require coordinated care of a physician, rehab nurse, PT, OT, and SLP to address physical and functional deficits in the context of the above medical diagnosis(es)? Yes Addressing deficits  in the following areas: balance, endurance, locomotion, strength, transferring, bowel/bladder control, bathing, dressing, feeding, grooming and toileting 5. Can the patient actively participate in an intensive therapy program of at least 3 hrs of therapy 5 days a week? Yes 6. The potential for patient to make measurable gains while on inpatient rehab is good 7. Anticipated functional outcomes upon discharge from inpatient rehab: supervision PT,  supervision and min assist OT, n/a SLP 8. Estimated rehab length of stay to reach the above functional goals is: 10-14 days 9. Anticipated discharge destination: Home 10. Overall Rehab/Functional Prognosis: good     MD Signature:          Revision History

## 2019-08-10 NOTE — Progress Notes (Addendum)
Brian Daniels PHYSICAL MEDICINE & REHABILITATION PROGRESS NOTE   Subjective/Complaints:  Pt reports L shoulder is bothering him - is "too tight" and throbbing and aching esp with ROM.   Still having muscle spasms which are occ painful and always annoying.  Also describes nerve pain in hands and feet B/L- bothersome.     ROS: patient denies SOB, CP, abd pain, vision changes (is blind) N/V/D/Constipation.   Objective:   No results found. Recent Labs    08/10/19 0510  WBC 7.6  HGB 12.6*  HCT 39.1  PLT 191   Recent Labs    08/10/19 0510  NA 140  K 3.4*  CL 103  CO2 27  GLUCOSE 97  BUN 15  CREATININE 0.76  CALCIUM 9.0    Intake/Output Summary (Last 24 hours) at 08/10/2019 0851 Last data filed at 08/10/2019 0400 Gross per 24 hour  Intake 240 ml  Output 1700 ml  Net -1460 ml     Physical Exam: Vital Signs Blood pressure (!) 164/83, pulse 71, temperature 98.7 F (37.1 C), temperature source Oral, resp. rate 18, height 5\' 10"  (1.778 m), weight 71.6 kg, SpO2 94 %.  Constitutional: He appears well-developed and well-nourished.  Wearing dark glasses; laying in bed ; appropriate, NAD  HENT:  Head: Normocephalic and atraumatic.  Eyes: wearing sunglasses   Neck:  ACDF incision site clean and dry with honeycomb dressing on R anterior neck.  Decreased cervical rotation  Cardiovascular: RRR  Respiratory: CTA B/L no W/R/R  GI: Soft, NT, ND, hypoactive BS  Genitourinary: Genitourinary Comments: Condom catheter in place  Musculoskeletal:  Cervical back: Neck supple.  Comments: RUE- bicep 4/5, WE 4/5, triceps 4+/5, grip 4-/5, finger 4+/5 LUE- Biceps 2+/5, WE 2+/5, triceps 2+/5, grip 3/5, finger 3/5 RLE- HF 4-/5, KE 4/5, DF 4=/5, PF 4-/5 LLE- HF 2/5, KE 2/5, DF 2/5, PF 2-/5 Neurological:  Hoffman's LUE- strong; (-) Hoffman's in RUE no clonus in LEs B/L Sensation intact at C3 B/L; otherwise decreased at every dermatome to L3 B/L ; L4-S2 dermatomes B/L very decreased  sensation- same S3-S5 B/L  Skin:  IV in R hand- no signs of infiltration No signs of skin breakdown seen except incision on neck  Psychiatric:  Very appropriate and pleasant    Assessment/Plan: 1. Functional deficits secondary to C3 ASIA C/D quadriplegia  which require 3+ hours per day of interdisciplinary therapy in a comprehensive inpatient rehab setting.  Physiatrist is providing close team supervision and 24 hour management of active medical problems listed below.  Physiatrist and rehab team continue to assess barriers to discharge/monitor patient progress toward functional and medical goals  Care Tool:  Bathing              Bathing assist       Upper Body Dressing/Undressing Upper body dressing        Upper body assist      Lower Body Dressing/Undressing Lower body dressing            Lower body assist       Toileting Toileting    Toileting assist Assist for toileting: Total Assistance - Patient < 25%     Transfers Chair/bed transfer  Transfers assist           Locomotion Ambulation   Ambulation assist              Walk 10 feet activity   Assist           Walk 50 feet activity  Assist           Walk 150 feet activity   Assist           Walk 10 feet on uneven surface  activity   Assist           Wheelchair     Assist               Wheelchair 50 feet with 2 turns activity    Assist            Wheelchair 150 feet activity     Assist          Blood pressure (!) 164/83, pulse 71, temperature 98.7 F (37.1 C), temperature source Oral, resp. rate 18, height 5\' 10"  (1.778 m), weight 71.6 kg, SpO2 94 %.    Medical Problem List and Plan:  1. Bilateral upper and lower extremity weakness with paresthesias left greater than right with gait disturbance secondary to cervical myelopathy.S/P multilevel discectomy C3-4, 4-5, 5-6 and C6-7 with decompression of spinal cord and exiting  nerve roots with arthrodesis 08/05/2019 per Dr. Kathyrn Sheriff. HARD Cervical collar when out of bed.  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  -patient may Shower if covers neck incision  -ELOS/Goals: Supervision to Min assist - 3-3.5 weeks due to SCI  2. Antithrombotics:  -DVT/anticoagulation: SCDs. Discuss Lovenox for DVT prophylaxis with neurosurgery. Check vascular study   1/27- will start lovenox due to increased risk of DVT -antiplatelet therapy: Aspirin 81 mg to begin 08/12/2019  3. Pain Management: Post op pain, nerve pain and spasticity- Hydrocodone/Robaxin as needed  -will try and change to Baclofen for muscle spasms/spasticity once gets to CIR and possibly add Duloxetine for nerve pain   1/27- added baclofen 5 mg TID for spasticity and Duloxetine 30 mg QHS for nerve pain 4. Mood: Provide emotional support  -antipsychotic agents: N/A  5. Neuropsych: This patient is capable of making decisions on his own behalf.  6. Skin/Wound Care: Routine skin checks  7. Fluids/Electrolytes/Nutrition: Routine in and outs with follow-up chemistries  8. Hypertension. Toprol-XL 25 mg daily, Lotensin 10 mg daily, Norvasc 5 mg daily. Monitor with increased mobility  9. Diabetes mellitus. Hemoglobin A1c 6.3. Glucophage 500 mg daily. Check blood sugars before meals and at bedtime   CBG (last 3)  Recent Labs    08/09/19 1645 08/09/19 2109 08/10/19 0632  GLUCAP 131* 129* 104*    1/27- BGs well controlled  10. Glaucoma/legally blind. Continue eyedrops as directed- continue pt's dark sunglasses for light sensitivity.  11. Hyperlipidemia. Lipitor  12. Constipation in setting of Neurogenic bowel. Adjust bowel program as needed.  13. Neurogenic bladder- will check PVRS to make sure is emptying since needed a in/out cath due to lack of voiding 2 days ago.  1/27- ordered PVRs   14/ Hypokalemia  1/27- K+ 3.4- will replete  LOS: 1 days A FACE TO FACE EVALUATION WAS  PERFORMED  Issabelle Mcraney 08/10/2019, 8:51 AM

## 2019-08-10 NOTE — H&P (Signed)
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 Per chart review patient lives with his sister. 1 level home no steps to entry. By report patient has been essentially wheelchair-bound since 05/30/2019. He denies any bowel or bladder disturbances. Patient has been followed outpatient neurology for gait abnormality. Presented 08/05/2019 to the ED accompanied by his sister with inability to get out of his chair after recent fall and reported bilateral upper and lower body weakness with paresthesias left greater than right that has progressed over the last 2 months. Cranial CT scan 05/30/2019 showed no acute intracranial abnormality and MRI of the brain 06/27/2019 moderate perisylvian atrophy and moderate ventriculomegaly. Moderate to severe spinal stenosis at C3-4 noted partially on sagittal views. No acute findings noted... Recent MRI cervical spine demonstrates significant multilevel stenosis from C3-4 through C6-7 including T2 signal change in the spinal cord. Recent MRI that was completed of the brain although it did show some ventriculomegaly symptoms most consistent with cervical myelopathy rather than normal pressure hydrocephalus after being reviewed by neurosurgery. Patient underwent discectomy at C3-4, 4-5, 5-6, C6-7 for decompression of spinal cord and exiting nerve roots placement of intervertebral biomechanical device/placement of anterior instrumentation consisting of interbody plate and screws as well as arthrodesis 08/05/2019 per Dr. Conchita Paris. Hard cervical collar when out of bed.[name] Kitchen Hospital course pain management. Tolerating a regular consistency diet. Therapy evaluations completed with recommendations of physical medicine rehab consult. Patient was admitted for a comprehensive rehab program.  Pt reports had spasticity/muscle  spasms since December or so- very slightly better since surgery; also has nerve pain in hands and feet- sometimes works its way proximally on arms and legs- tingling/burning. Thought these Sx's were "normal" for his situation.  Feels throbbing/aching pain is well controlled with current meds.  Required in/out cath x1-2 times a few days ago after surgery but not since- not sure if they've scanned bladder since initially, but has been voding "ok" with Condom catheter- also was feeling constipated- small BM this AM and large one just now.  Review of Systems  Constitutional: Negative for fever.  HENT: Negative for hearing loss.  Eyes: Negative for blurred vision and double vision.  Patient is legally blind  Respiratory: Negative for cough and shortness of breath.  Cardiovascular: Negative for chest pain, palpitations and leg swelling.  Gastrointestinal: Positive for constipation. Negative for heartburn, nausea and vomiting.  Genitourinary: Positive for urgency. Negative for dysuria, flank pain and hematuria.  Musculoskeletal: Positive for back pain, joint pain and myalgias.  Patient with reported recent fall.  Skin: Negative for rash.  Neurological: Positive for tingling, sensory change and weakness.  All other systems reviewed and are negative.       Past Medical History:  Diagnosis Date  . Cataracts, bilateral    removed by surgery  . Diabetes mellitus without complication (HCC)    type 2  . Glaucoma    bilateral  . Gout   . Hypercholesteremia   . Hypertension   . Left atrial enlargement   . Legally blind    bilateral  . Neuropathy    hands, arm  . Numbness    hands, arms  . Wears partial dentures    lower  . Wheelchair bound    since 05/30/19        Past Surgical History:  Procedure Laterality Date  . ANTERIOR CERVICAL DECOMPRESSION/DISCECTOMY FUSION  4 LEVELS N/A 08/05/2019   Procedure: ANTERIOR CERVICAL DECOMPRESSION/DISCECTOMY FUSION CERVICAL THREE- CERVICAL FOUR,  CERVICAL FOUR- CERVICAL FIVE, CERVICAL FIVE- CERVICAL SIX, CERVICAL SIX- CERVICAL SEVEN; Surgeon: Consuella Lose, MD; Location: [facility]; Service: Neurosurgery; Laterality: N/A; anterior  . COLONOSCOPY    . EYE SURGERY Bilateral    multiple surgeries bilateral -   . MULTIPLE TOOTH EXTRACTIONS    . WISDOM TOOTH EXTRACTION          Family History  Problem Relation Age of Onset  . Hyperlipidemia Mother   . Diabetes Mother   . Hypertension Father    Social History: reports that he has never smoked. He has never used smokeless tobacco. He reports previous alcohol use. He reports that he does not use drugs.  Allergies: No Known Allergies Quit smoking at age 25- never "big smoker" just when drinking; quit drinking at age 26- no illicits; lives in 1 story house with 1/2 step to enter with sister- moved in with her when became blind- physically/medically retired due to blindness.        Medications Prior to Admission  Medication Sig Dispense Refill  . amLODipine (NORVASC) 5 MG tablet Take 5 mg by mouth daily.    . Ascorbic Acid (VITAMIN C PO) Take 1,000 mg by mouth daily.     [name] Kitchen atorvastatin (LIPITOR) 20 MG tablet Take 20 mg by mouth daily.    . benazepril (LOTENSIN) 10 MG tablet Take 10 mg by mouth daily.    . BL CALCIUM-MAGNESIUM-ZINC PO Take 1 tablet by mouth daily.    . brimonidine (ALPHAGAN) 0.2 % ophthalmic solution Place 1 drop into both eyes 3 (three) times daily.    . Cyanocobalamin (B-12 PO) Take 5,000 mcg by mouth daily.     [name] Kitchen GINSENG PO Take 2 tablets by mouth daily as needed (energy boost).    . indomethacin (INDOCIN) 50 MG capsule Take 50 mg by mouth 3 (three) times daily as needed for moderate pain.     . metFORMIN (GLUCOPHAGE-XR) 500 MG 24 hr tablet Take 500 mg by mouth at bedtime.     . metoprolol succinate (TOPROL-XL) 25 MG 24 hr tablet Take 25 mg by mouth daily.    . Omega-3 Fatty Acids (FISH OIL) 1000 MG CAPS Take 1,000 mg by mouth daily.     [name] Kitchen OVER THE COUNTER MEDICATION  Take 1 tablet by mouth daily. Super Beta Prostate    . prednisoLONE acetate (PRED FORTE) 1 % ophthalmic suspension Place 1 drop into the right eye 2 (two) times daily.    . timolol (BETIMOL) 0.5 % ophthalmic solution Place 1 drop into both eyes 2 (two) times daily.    . vitamin E 180 MG (400 UNITS) capsule Take 180 mg by mouth daily.     [name] Kitchen aspirin EC 81 MG tablet Take 81 mg by mouth daily.      Drug Regimen Review  Drug regimen was reviewed and remains appropriate with no significant issues identified  Home:  Home Living  Family/patient expects to be discharged to:: Private residence  Living Arrangements: (lives with hi sister for the past year. From [place], [place])  Available Help at Discharge: Family, Available 24 hours/day  Type of Home: House  Home Access: Stairs to enter  [employer] of Steps: 1  Entrance Stairs-Rails: None  Home Layout: One level  Bathroom Shower/Tub: Administrator, [employer]: Handicapped height  Bathroom Accessibility: Yes  Home Equipment: Wheelchair - manual  Lives With: Dance movement psychotherapist, [place])  Functional History:  Prior Function  Level of Independence: Needs assistance  Gait / Transfers Assistance Needed: transfer with sister's assist (sounds like squat-pivot over armrest); prior to 11/16 was walking; w/c bound with sister pushing him since 11/16  ADL's / Homemaking Assistance Needed: sister assist with ADLs but has progress since fall and IADLS she completed  Functional Status:  Mobility:  Bed Mobility  Overal bed mobility: Needs Assistance  Bed Mobility: Rolling, Sidelying to Sit  Rolling: Mod assist  Sidelying to sit: Mod assist, +2 for physical assistance  General bed mobility comments: pt very retropulsive, requiring max verbal and tactile cues to complete task, modA for trunk elevation  Transfers  Overall transfer level: Needs assistance  Equipment used: Rolling walker (2 wheeled)  Transfers: Sit to/from Stand  Sit to Stand: Mod  assist, +2 physical assistance, +2 safety/equipment, From elevated surface  General transfer comment: Bed elevated, modA +2 for power upl R knee blocked; pt with posterior lean and R lateral lean with multimodal cues to fix posture  Ambulation/Gait  Ambulation/Gait assistance: Mod assist, +2 safety/equipment  Gait Distance (Feet): 25 Feet  Assistive device: 2 person hand held assist  Gait Pattern/deviations: Decreased stride length, Step-through pattern, Narrow base of support  General Gait Details: pt request to have bilat HHA due to vision impairment. pt with ataxic like gait pattern with near scissoring/cross over, pt very dependent on bilat HHA, pt began to become shaky due to fatigue  Gait velocity: slow  Gait velocity interpretation: <1.8 ft/sec, indicate of risk for recurrent falls   ADL:  ADL  Overall ADL's : Needs assistance/impaired  Eating/Feeding: Set up, Sitting  Grooming: Wash/dry face, Minimal assistance, Sitting  Upper Body Bathing: Moderate assistance, Cueing for safety, Cueing for sequencing, Sitting  Lower Body Bathing: Maximal assistance, Sit to/from stand, Cueing for safety, Cueing for sequencing  Upper Body Dressing : Total assistance  Upper Body Dressing Details (indicate cue type and reason): donning cervical collar  Lower Body Dressing: Maximal assistance, Sit to/from stand  Toilet Transfer: Maximal assistance, +2 for physical assistance, +2 for safety/equipment, Cueing for safety  Toileting- Clothing Manipulation and Hygiene: Maximal assistance, Cueing for safety, Cueing for sequencing  Tub/ Shower Transfer: +2 for physical assistance, +2 for safety/equipment, Moderate assistance  Functional mobility during ADLs: Moderate assistance, +2 for physical assistance, +2 for safety/equipment, Cueing for safety, Rolling walker  General ADL Comments: Pt limited by pain, decreased ability to care for self and decreased strength with low vision at baseline in a new  environment.  Cognition:  Cognition  Overall Cognitive Status: Within Functional Limits for tasks assessed  Orientation Level: Oriented X4  Cognition  Arousal/Alertness: Awake/alert  Behavior During Therapy: WFL for tasks assessed/performed  Overall Cognitive Status: Within Functional Limits for tasks assessed  General Comments: Pt following all commadns and asked to repeat instructions when unsure of what was asked  Physical Exam:  Blood pressure (!) 144/84, pulse 93, temperature (!) 97.5 F (36.4 C), temperature source Oral, resp. rate 20, height 5\' 10"  (1.778 m), weight 73.9 kg, SpO2 92 %.  Physical Exam  Nursing note and vitals reviewed.  Constitutional: He appears well-developed and well-nourished.  Wearing dark glasses; laying in bed listening to TV/news, NAD  HENT:  Head: Normocephalic and atraumatic.  Didn't want to take glasses off to assess eyes due to light sensitivity, however face symmetry equal and sensation intact  Eyes: Right eye exhibits no discharge. Left eye exhibits no discharge.  Didn't want to take sunglasses  off to assess due to light sensitivity.  Neck:  ACDF incision site clean and dry with honeycomb dressing on R anterior neck.  Decreased cervical rotation  Cardiovascular:  RRR  Respiratory:  CTA B/L no W/R/R  GI:  Soft, NT, ND, hypoactive BS  Genitourinary: Genitourinary Comments: Condom catheter in place  Musculoskeletal:  Cervical back: Neck supple.  Comments: RUE- bicep 4/5, WE 4/5, triceps 4+/5, grip 4-/5, finger 4+/5 LUE- Biceps 2+/5, WE 2+/5, triceps 2+/5, grip 3/5, finger 3/5  RLE- HF 4-/5, KE 4/5, DF 4=/5, PF 4-/5 LLE- HF 2/5, KE 2/5, DF 2/5, PF 2-/5  Neurological:  Patient is alert in no acute distress sitting up in bed. He does follow simple commands. Provides his name age in place. Patient is legally blind which did limit overall examination. Hoffman's LUE- strong; (-) Hoffman's in RUE no clonus in LEs B/L Sensation intact at C3 B/L;  otherwise decreased at every dermatome to L3 B/L ; L4-S2 dermatomes B/L very decreased sensation- same S3-S5 B/L  Skin:  IV in R hand- no signs of infiltration No signs of skin breakdown seen except incision on neck  Psychiatric:  Very appropriate and pleasant   Lab Results Last 48 Hours       Results for orders placed or performed during the hospital encounter of 08/05/19 (from the past 48 hour(s))  Glucose, capillary Status: None   Collection Time: 08/07/19 8:53 AM  Result Value Ref Range   Glucose-Capillary 78 70 - 99 mg/dL  Glucose, capillary Status: Abnormal   Collection Time: 08/07/19 3:18 PM  Result Value Ref Range   Glucose-Capillary 108 (H) 70 - 99 mg/dL  Glucose, capillary Status: Abnormal   Collection Time: 08/07/19 5:15 PM  Result Value Ref Range   Glucose-Capillary 110 (H) 70 - 99 mg/dL  Glucose, capillary Status: Abnormal   Collection Time: 08/07/19 9:43 PM  Result Value Ref Range   Glucose-Capillary 111 (H) 70 - 99 mg/dL  Glucose, capillary Status: Abnormal   Collection Time: 08/08/19 9:40 AM  Result Value Ref Range   Glucose-Capillary 126 (H) 70 - 99 mg/dL  Glucose, capillary Status: Abnormal   Collection Time: 08/08/19 11:05 AM  Result Value Ref Range   Glucose-Capillary 118 (H) 70 - 99 mg/dL  Glucose, capillary Status: None   Collection Time: 08/08/19 3:52 PM  Result Value Ref Range   Glucose-Capillary 81 70 - 99 mg/dL  Glucose, capillary Status: Abnormal   Collection Time: 08/08/19 9:32 PM  Result Value Ref Range   Glucose-Capillary 106 (H) 70 - 99 mg/dL  Glucose, capillary Status: Abnormal   Collection Time: 08/08/19 9:59 PM  Result Value Ref Range   Glucose-Capillary 102 (H) 70 - 99 mg/dL   Imaging Results (Last 48 hours)    Medical Problem List and Plan:  1. Bilateral upper and lower extremity weakness with paresthesias left greater than right with gait disturbance secondary to cervical myelopathy.S/P multilevel discectomy C3-4, 4-5, 5-6 and  C6-7 with decompression of spinal cord and exiting nerve roots with arthrodesis 08/05/2019 per Dr. Conchita Paris. HARD Cervical collar when out of bed.  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  -patient may Shower if covers neck incision  -ELOS/Goals: Supervision to Min assist - 3-3.5 weeks due to SCI  2. Antithrombotics:  -DVT/anticoagulation: SCDs. Discuss Lovenox for DVT prophylaxis with neurosurgery. Check vascular study  -antiplatelet therapy: Aspirin 81 mg to begin 08/12/2019  3. Pain Management: Post op pain, nerve pain and spasticity- Hydrocodone/Robaxin as needed  -  will try and change to Baclofen for muscle spasms/spasticity once gets to CIR and possibly add Duloxetine for nerve pain  4. Mood: Provide emotional support  -antipsychotic agents: N/A  5. Neuropsych: This patient is capable of making decisions on his own behalf.  6. Skin/Wound Care: Routine skin checks  7. Fluids/Electrolytes/Nutrition: Routine in and outs with follow-up chemistries  8. Hypertension. Toprol-XL 25 mg daily, Lotensin 10 mg daily, Norvasc 5 mg daily. Monitor with increased mobility  9. Diabetes mellitus. Hemoglobin A1c 6.3. Glucophage 500 mg daily. Check blood sugars before meals and at bedtime  10. Glaucoma/legally blind. Continue eyedrops as directed- continue pt's dark sunglasses for light sensitivity.  11. Hyperlipidemia. Lipitor  12. Constipation in setting of Neurogenic bowel. Adjust bowel program as needed.  13. Neurogenic bladder- will check PVRS to make sure is emptying since needed a in/out cath due to lack of voiding 2 days ago.  Mcarthur Rossetti Angiulli, PA-C  08/09/2019  I have personally performed a face to face diagnostic evaluation of this patient and formulated the key components of the plan.  Additionally, I have personally reviewed laboratory data, imaging studies, as well as relevant notes and concur with the physician assistant's documentation above.   The patient's  status has not changed from the original H&P.  Any changes in documentation from the acute care chart have been noted above.

## 2019-08-10 NOTE — Evaluation (Signed)
Physical Therapy Assessment and Plan  Patient Details  Name: Brian Daniels MRN: [id number] Date of Birth: 07-Feb-1956  PT Diagnosis: Abnormal posture, Difficulty walking, Impaired sensation, Muscle weakness and Quadriplegia Rehab Potential: Good ELOS: 21-24 days   Today's Date: 08/10/2019 PT Individual Time: 1100-1200; 1300-1415 PT Individual Time Calculation (min): 60 min and 75 min    Problem List:  Patient Active Problem List   Diagnosis Date Noted  . Neurogenic bladder 08/09/2019  . Spasticity 08/09/2019  . Stenosis of cervical spine with myelopathy ([place]) 08/05/2019  . Cervical myelopathy ([place]) 06/30/2019  . Bilateral low back pain without sciatica 06/13/2019  . Weakness 06/13/2019  . Paresthesia 06/13/2019  . Urinary urgency 06/13/2019  . Gait abnormality 06/13/2019    Past Medical History:  Past Medical History:  Diagnosis Date  . Cataracts, bilateral    removed by surgery  . Diabetes mellitus without complication ([place])    type 2  . Glaucoma    bilateral  . Gout   . Hypercholesteremia   . Hypertension   . Left atrial enlargement   . Legally blind    bilateral  . Neuropathy    hands, arm  . Numbness    hands, arms  . Wears partial dentures    lower  . Wheelchair bound    since 05/30/19   Past Surgical History:  Past Surgical History:  Procedure Laterality Date  . ANTERIOR CERVICAL DECOMPRESSION/DISCECTOMY FUSION 4 LEVELS N/A 08/05/2019   Procedure: ANTERIOR CERVICAL DECOMPRESSION/DISCECTOMY FUSION CERVICAL THREE- CERVICAL FOUR, CERVICAL FOUR- CERVICAL FIVE, CERVICAL FIVE- CERVICAL SIX, CERVICAL SIX- CERVICAL SEVEN;  Surgeon: Consuella Lose, MD;  Location: [facility];  Service: Neurosurgery;  Laterality: N/A;  anterior  . COLONOSCOPY    . EYE SURGERY Bilateral    multiple surgeries bilateral -   . MULTIPLE TOOTH EXTRACTIONS    . WISDOM TOOTH EXTRACTION      Assessment & Plan Clinical Impression:  Brian Daniels is a 64 year old right-handed male with  history of hypertension, hyperlipidemia, type 2 diabetes mellitus and legally blind secondary to glaucoma. Per chart review patient lives with his sister. 1 level home no steps to entry. By report patient has been essentially wheelchair-bound since 05/30/2019. He denies any bowel or bladder disturbances. Patient has been followed outpatient neurology for gait abnormality. Presented 08/05/2019 to the ED accompanied by his sister with inability to get out of his chair after recent fall and reported bilateral upper and lower body weakness with paresthesias left greater than right that has progressed over the last 2 months. Cranial CT scan 05/30/2019 showed no acute intracranial abnormality and MRI of the brain 06/27/2019 moderate perisylvian atrophy and moderate ventriculomegaly. Moderate to severe spinal stenosis at C3-4 noted partially on sagittal views. No acute findings noted... Recent MRI cervical spine demonstrates significant multilevel stenosis from C3-4 through C6-7 including T2 signal change in the spinal cord. Recent MRI that was completed of the brain although it did show some ventriculomegaly symptoms most consistent with cervical myelopathy rather than normal pressure hydrocephalus after being reviewed by neurosurgery. Patient underwent discectomy at C3-4, 4-5, 5-6, C6-7 for decompression of spinal cord and exiting nerve roots placement of intervertebral biomechanical device/placement of anterior instrumentation consisting of interbody plate and screws as well as arthrodesis 08/05/2019 per Dr. Kathyrn Sheriff. Hard cervical collar when out of bed.[name] course pain management. Tolerating a regular consistency diet. Therapy evaluations completed with recommendations of physical medicine rehab consult. Patient was admitted for a comprehensive rehab program. Patient transferred to CIR on  08/09/2019 .   Patient currently requires total with mobility secondary to muscle weakness and muscle joint tightness,  impaired timing and sequencing, abnormal tone, unbalanced muscle activation and decreased coordination, decreased visual acuity and decreased sitting balance, decreased standing balance, decreased postural control and decreased balance strategies.  Prior to hospitalization, patient was total with mobility and lived with Family in a House home.  Home access is 1Stairs to enter.  Patient will benefit from skilled PT intervention to maximize safe functional mobility, minimize fall risk and decrease caregiver burden for planned discharge home with 24 hour assist.  Anticipate patient will benefit from follow up [facility] at discharge.  PT - End of Session Activity Tolerance: Tolerates 30+ min activity with multiple rests Endurance Deficit: Yes Endurance Deficit Description: frequent rest breaks during functional activity PT Assessment Rehab Potential (ACUTE/IP ONLY): Good PT Barriers to Discharge: Medical stability PT Patient demonstrates impairments in the following area(s): Balance;Endurance;Motor;Pain;Safety;Sensory PT Transfers Functional Problem(s): Bed Mobility;Bed to Chair;Car;Furniture;Floor PT Locomotion Functional Problem(s): Ambulation;Wheelchair Mobility;Stairs PT Plan PT Intensity: Minimum of 1-2 x/day ,45 to 90 minutes PT Frequency: 5 out of 7 days PT Duration Estimated Length of Stay: 21-24 days PT Treatment/Interventions: Ambulation/gait training;Balance/vestibular training;Community reintegration;Discharge planning;DME/adaptive equipment instruction;Functional electrical stimulation;Functional mobility training;Neuromuscular re-education;Pain management;Patient/family education;Psychosocial support;Splinting/orthotics;Stair training;Therapeutic Activities;Therapeutic Exercise;UE/LE Strength taining/ROM;UE/LE Coordination activities;Wheelchair propulsion/positioning PT Transfers Anticipated Outcome(s): min A PT Locomotion Anticipated Outcome(s): min A overall with LRAD PT  Recommendation Recommendations for Other Services: Neuropsych consult;Therapeutic Recreation consult Therapeutic Recreation Interventions: Stress management Follow Up Recommendations: Home health PT Patient destination: Home Equipment Recommended: To be determined Equipment Details: TBD pending progress  Skilled Therapeutic Intervention Session 1: Evaluation completed (see details above and below) with education on PT POC and goals and individual treatment initiated with focus on functional transfer assessment, discussion of rehab LOS and goals, and setting pt up with appropriate equipment. Pt received supine in bed, agreeable to PT session. Pt reports some pain in L shoulder with mobility, not rated and declines intervention. Supine to sit with assist x 2 to the L, assist with BLE management and trunk control. Pt requires min to mod A for sitting balance EOB, leans posteriorly and to the R laterally. Pt is dependent to don hard collar while seated EOB. Sit to stand with mod A x 2 to stedy from elevated bed. Stedy transfer to w/c. Set pt up with reclining back w/c for improved safety, support, and comfort. Sit to stand with max A from w/c to stedy. Stedy transfer back to bed. Sit to supine assist x 2 for trunk control and BLE management. Pt left semi-reclined in bed with needs in reach, bed alarm in place at end of session. Pt requires increased time and max v/c throughout session due to visual deficits.  Session 2: Pt received seated in bed still working on lunch. Pt unable to grasp fork with R hand due to grip strength weakness. Applied built up handle to fork and pt able to better grasp fork, still requires hand-over-hand assist to bring fork to his mouth due to UE weakness and visual impairments. Supine to sit with max A to R side of bed. Sit to stand with mod A to stedy from elevated bed. Stedy transfer to w/c. Pt is dependent to don socks and shoes. Sit to stand x 3 reps in // bars with max cueing  for body positioning. Pt tends to stand in flexed trunk posture. Pt able to perform lateral weight shift L/R, tends to not WB through LLE  with standing lateral lean to the R. Stedy transfer back to bed at end of session. Sit to supine mod A for BLE management. Pt left semi-reclined in bed with needs in reach at end of session.   PT Evaluation Precautions/Restrictions Precautions Precautions: Fall Precaution Comments: pt legally blind in both eyes Required Braces or Orthoses: Cervical Brace Cervical Brace: Hard collar Restrictions Weight Bearing Restrictions: No General   Vital SignsTherapy Vitals Temp: 98 F (36.7 C) Pulse Rate: 63 Resp: 18 BP: 137/70 Patient Position (if appropriate): Sitting Oxygen Therapy SpO2: 100 % O2 Device: Room Air Pain Pain Assessment Pain Score: 9  Pain Type: Acute pain;Surgical pain;Neuropathic pain Pain Location: Shoulder Pain Orientation: Left Pain Descriptors / Indicators: Aching;Discomfort;Sharp Pain Onset: With Activity Pain Intervention(s): Repositioned Home Living/Prior Functioning Home Living Available Help at Discharge: Family;Available 24 hours/day Type of Home: House Home Access: Stairs to enter [employer] of Steps: 1 Entrance Stairs-Rails: None Home Layout: One level Bathroom Shower/Tub: Chiropodist: Handicapped height Bathroom Accessibility: Yes  Lives With: Family Prior Function Level of Independence: Needs assistance with ADLs;Needs assistance with tranfers  Able to Take Stairs?: No Driving: No Vocation: Retired Comments: Prior to May 30, 2019 pt was independent with self care but no IADLs.  Since November, his sister has been providing full A including sponge baths in bed and use of urinal/ bed pan only. Vision/Perception  Vision - History Baseline Vision: Airline pilot: Not tested Praxis Praxis: Intact  Cognition Overall Cognitive Status: Within Functional  Limits for tasks assessed Arousal/Alertness: Awake/alert Orientation Level: Oriented X4 Attention: Focused Focused Attention: Appears intact Memory: Appears intact Immediate Memory Recall: Sock;Blue;Bed Memory Recall Sock: Without Cue Memory Recall Blue: Without Cue Memory Recall Bed: Without Cue Awareness: Appears intact Problem Solving: Appears intact Safety/Judgment: Appears intact Sensation Sensation Light Touch: Appears Intact Light Touch Impaired Details: Impaired RUE;Impaired LUE;Impaired RLE;Impaired LLE Hot/Cold: Appears Intact Proprioception: Not tested Stereognosis: Impaired by gross assessment Additional Comments: Pt can feel light touch on extremities but says they feel numb with tingling Coordination Gross Motor Movements are Fluid and Coordinated: No Fine Motor Movements are Fluid and Coordinated: No Coordination and Movement Description: impaired 2/2 incomplete tetraplegia Finger Nose Finger Test: slow on R, unable to on L Motor  Motor Motor: Tetraplegia Motor - Skilled Clinical Observations: incomplete tetraplegia  Mobility Bed Mobility Bed Mobility: Rolling Right;Rolling Left;Supine to Sit;Sit to Supine Rolling Right: Minimal Assistance - Patient > 75% Rolling Left: Maximal Assistance - Patient 25-49% Supine to Sit: 2 Helpers Sit to Supine: 2 Helpers Transfers Transfers: Sit to [employer] via Geophysicist/field seismologist Sit to Stand: 2 Press photographer (Assistive device): Other (Comment)(mod +2 to stand to stedy) Transfer via Lift Equipment: Haematologist / Additional Locomotion Stairs: No Architect: No  Trunk/Postural Assessment  Cervical Assessment Cervical Assessment: Exceptions to WFL(recent ACDF, cervical collar out of bed) Thoracic Assessment Thoracic Assessment: Exceptions to WFL(rounded shoulders) Lumbar Assessment Lumbar Assessment: Exceptions to WFL(posterior pelvic tilt) Postural Control Postural  Control: Deficits on evaluation Trunk Control: min - mod unsupported sit balance  Balance Balance Balance Assessed: Yes Static Sitting Balance Static Sitting - Balance Support: Right upper extremity supported;Feet supported Static Sitting - Level of Assistance: 3: Mod assist Dynamic Sitting Balance Dynamic Sitting - Balance Support: Right upper extremity supported;Feet supported;During functional activity Dynamic Sitting - Level of Assistance: 1: +1 Total assist Static Standing Balance Static Standing - Balance Support: Bilateral upper extremity supported;During functional activity Static Standing - Level  of Assistance: 2: Max assist Extremity Assessment  RUE Assessment Passive Range of Motion (PROM) Comments: did not assess full shoulder ROM due to recent ACDF Active Range of Motion (AROM) Comments: shoulder flex and abd 80; distal WFL General Strength Comments: functional in elbow and hand - grasp 4+/5 LUE Assessment LUE Assessment: Exceptions to [facility] Passive Range of Motion (PROM) Comments: unable to assess due to severe pain in L shoulder with passive movement, can tolerate up to 60 sh flexion LUE Body System: Ortho LUE AROM (degrees) Left Shoulder Flexion: 20 Degrees Left Shoulder ABduction: 60 Degrees Left Elbow Extension: -20 Left Composite Finger Extension: 75% Left Composite Finger Flexion: 25% LUE PROM (degrees) Left Shoulder ABduction: 60 Degrees Left Elbow Extension: -10 LUE Strength Left Wrist Extension: 3/5 RLE Assessment RLE Assessment: Within Functional Limits Passive Range of Motion (PROM) Comments: tight HS General Strength Comments: see below RLE Strength Right Hip Flexion: 4/5 Right Knee Flexion: 4/5 Right Knee Extension: 4/5 Right Ankle Dorsiflexion: 4/5 LLE Assessment LLE Assessment: Exceptions to [facility] Passive Range of Motion (PROM) Comments: tight HS General Strength Comments: impaired, see below LLE Strength Left Hip Flexion: 2-/5 Left Knee  Flexion: 3/5 Left Knee Extension: 3/5 Left Ankle Dorsiflexion: 1/5    Refer to Care Plan for Long Term Goals  Recommendations for other services: Neuropsych and Therapeutic Recreation  Stress management  Discharge Criteria: Patient will be discharged from PT if patient refuses treatment 3 consecutive times without medical reason, if treatment goals not met, if there is a change in medical status, if patient makes no progress towards goals or if patient is discharged from hospital.  The above assessment, treatment plan, treatment alternatives and goals were discussed and mutually agreed upon: by patient   Excell Seltzer, PT, DPT 08/10/2019, 3:50 PM

## 2019-08-10 NOTE — Progress Notes (Signed)
Social Work Patient ID: Brian Daniels, male   DOB: 11/30/1955, 64 y.o.   MRN: [id number]   SW received return phone call from pt sister [name] to introduce self, explain role, and discuss discharge process. She confirms plan is for pt to return to her home. She reports she is the primary caregiver and other family live atleast 2 hrs away. She states she is hopeful pt will return to where he can walk again when he leaves rehab. She states pt has not been walking since November 2020 and many of his personal care tasks were completed in the bed. She states she would like for a CNA at discharge for showering. SW informed there will continue to be updates as pt discharge needs are defined. SW also informed there will be further follow up for family education once a discharge date is established.   SW will continue to follow and assess pt for discharge needs.

## 2019-08-10 NOTE — Evaluation (Signed)
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 Rehab Potential: Rehab Potential (ACUTE ONLY): Good ELOS: 21-23 days   Today's Date: 08/10/2019 OT Individual Time: 0935-1035 OT Individual Time Calculation (min): 60 min     Problem List:  Patient Active Problem List   Diagnosis Date Noted  . Neurogenic bladder 08/09/2019  . Spasticity 08/09/2019  . 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 Cervical myelopathy (HCC) 06/30/2019  . Bilateral low back pain without sciatica 06/13/2019  . Weakness 06/13/2019  . Paresthesia 06/13/2019  . Urinary urgency 06/13/2019  . Gait abnormality 06/13/2019    Past Medical History:  Past Medical History:  Diagnosis Date  . Cataracts, bilateral    removed by surgery  . Diabetes mellitus without complication (HCC)    type 2  . Glaucoma    bilateral  . Gout   . Hypercholesteremia   . Hypertension   . Left atrial enlargement   . Legally blind    bilateral  . Neuropathy    hands, arm  . Numbness    hands, arms  . Wears partial dentures    lower  . Wheelchair bound    since 05/30/19   Past Surgical History:  Past Surgical History:  Procedure Laterality Date  . ANTERIOR CERVICAL DECOMPRESSION/DISCECTOMY FUSION 4 LEVELS N/A 08/05/2019   Procedure: ANTERIOR CERVICAL DECOMPRESSION/DISCECTOMY FUSION CERVICAL THREE- CERVICAL FOUR, CERVICAL FOUR- CERVICAL FIVE, CERVICAL FIVE- CERVICAL SIX, CERVICAL SIX- CERVICAL SEVEN;  Surgeon: Nundkumar, Neelesh, MD;  Location: [facility] OR;  Service: Neurosurgery;  Laterality: N/A;  anterior  . COLONOSCOPY    . EYE SURGERY Bilateral    multiple surgeries bilateral -   . MULTIPLE TOOTH EXTRACTIONS    . 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 Per chart review patient lives with his sister. 1 level home no steps to entry. By report patient has been essentially wheelchair-bound since 05/30/2019. He denies any bowel or bladder disturbances. Patient has been followed outpatient neurology for gait abnormality. Presented 08/05/2019 to the ED accompanied by his sister with inability to get out of his chair after recent fall and reported bilateral upper and lower body weakness with paresthesias left greater than right that has progressed over the last 2 months. Cranial CT scan 05/30/2019 showed no acute intracranial abnormality and MRI of the brain 06/27/2019 moderate perisylvian atrophy and moderate ventriculomegaly. Moderate to severe spinal stenosis at C3-4 noted partially on sagittal views. No acute findings noted... Recent MRI cervical spine demonstrates significant multilevel stenosis from C3-4 through C6-7 including T2 signal change in the spinal cord. Recent MRI that was completed of the brain although it did show some ventriculomegaly symptoms most consistent with cervical myelopathy rather than normal pressure hydrocephalus after being reviewed by neurosurgery. Patient underwent discectomy at C3-4, 4-5, 5-6, C6-7 for decompression of spinal cord and exiting nerve roots placement of intervertebral biomechanical device/placement of anterior instrumentation consisting of interbody plate and screws as well as arthrodesis 08/05/2019 per Dr. Nundkumar. Hard cervical collar when out of bed.. Hospital course pain management. Tolerating a regular consistency diet. Therapy evaluations completed with recommendations of physical medicine rehab consult. Patient was admitted for a comprehensive rehab program.    Patient transferred   to CIR on 08/09/2019 .    Patient currently requires max with basic self-care skills secondary to muscle weakness and muscle joint tightness,  decreased cardiorespiratoy endurance, unbalanced muscle activation and decreased coordination, legally blind and decreased sitting balance, decreased standing balance, decreased postural control and decreased balance strategies.  Prior to hospitalization, patient could complete ADLS with max.  Prior to May 30, 2019 pt was Independent.   Patient will benefit from skilled intervention to increase independence with basic self-care skills prior to discharge home with care partner.  Anticipate patient will require 24 hour supervision and minimal physical assistance and follow up home health.  OT - End of Session Activity Tolerance: Tolerates 10 - 20 min activity with multiple rests Endurance Deficit: Yes OT Assessment Rehab Potential (ACUTE ONLY): Good OT Patient demonstrates impairments in the following area(s): Balance;Endurance;Motor;Vision;Sensory;Pain OT Basic ADL's Functional Problem(s): Eating;Grooming;Bathing;Dressing;Toileting OT Transfers Functional Problem(s): Toilet;Tub/Shower OT Additional Impairment(s): Fuctional Use of Upper Extremity OT Plan OT Intensity: Minimum of 1-2 x/day, 45 to 90 minutes OT Frequency: 5 out of 7 days OT Duration/Estimated Length of Stay: 21-23 days OT Treatment/Interventions: Balance/vestibular training;Discharge planning;DME/adaptive equipment instruction;Functional mobility training;Pain management;Neuromuscular re-education;Patient/family education;Self Care/advanced ADL retraining;Therapeutic Activities;Psychosocial support;UE/LE Strength taining/ROM;Therapeutic Exercise;UE/LE Coordination activities;Visual/perceptual remediation/compensation OT Self Feeding Anticipated Outcome(s): min A OT Basic Self-Care Anticipated Outcome(s): min A OT Toileting Anticipated Outcome(s): min A OT Bathroom Transfers Anticipated Outcome(s): min A OT Recommendation Patient destination: Home Follow Up Recommendations: Home health OT Equipment Recommended: Tub/shower  bench;3 in 1 bedside comode   Skilled Therapeutic Intervention Pt seen for initial evaluation and ADL training.  Reviewed purpose of OT, pt's goals, discussed care plan and ELOS. B/d from bed level with max A as pt as visual loss, L side weakness and very painful L shoulder.  Applied kinesiotape for support of shoulder.   Focused on self care techniques and bed mobility teaching pt how to manage L arm when rolling to his R.  Pt tolerated session well. In bed with bed alarm on and all needs met.  OT Evaluation Precautions/Restrictions  Precautions Precautions: Fall Precaution Comments: pt legally blind in both eyes Required Braces or Orthoses: Cervical Brace Cervical Brace: Hard collar Restrictions Weight Bearing Restrictions: No  Pain Pain Assessment Pain Score: 9  Pain Type: Acute pain;Surgical pain;Neuropathic pain Pain Location: Shoulder Pain Orientation: Left Pain Descriptors / Indicators: Aching;Discomfort;Sharp Pain Onset: With Activity Pain Intervention(s): Repositioned Home Living/Prior Functioning Home Living Living Arrangements: Other relatives Available Help at Discharge: Family, Available 24 hours/day Type of Home: House Home Access: Stairs to enter Entrance Stairs-Number of Steps: 1 Entrance Stairs-Rails: None Home Layout: One level Bathroom Shower/Tub: Tub/shower unit Bathroom Toilet: Handicapped height Bathroom Accessibility: Yes  Lives With: Family Prior Function Level of Independence: Needs assistance with ADLs, Needs assistance with tranfers  Able to Take Stairs?: No Driving: No Vocation: Retired Comments: Prior to May 30, 2019 pt was independent with self care but no IADLs.  Since November, his sister has been providing full A including sponge baths in bed and use of urinal/ bed pan only. ADL ADL Eating: Maximal assistance Where Assessed-Eating: Bed level Grooming: Maximal assistance Where Assessed-Grooming: Bed level Upper Body Bathing:  Moderate assistance Where Assessed-Upper Body Bathing: Bed level Lower Body Bathing: Maximal assistance Where Assessed-Lower Body Bathing: Bed level Upper Body Dressing: Maximal assistance Where Assessed-Upper Body Dressing: Bed level Lower Body Dressing: Dependent Where Assessed-Lower Body Dressing: Bed level Toileting: Not assessed Toilet Transfer Method: Not assessed Vision Baseline Vision/History: Legally blind Patient Visual Report:   No change from baseline Perception  Perception: Not tested Praxis Praxis: Intact Cognition Overall Cognitive Status: Within Functional Limits for tasks assessed Arousal/Alertness: Awake/alert Orientation Level: Person;Place;Situation Person: Oriented Place: Oriented Situation: Oriented Year: 2021 Month: January Day of Week: Correct Memory: (P) Appears intact Immediate Memory Recall: Sock;Blue;Bed Memory Recall Sock: Without Cue Memory Recall Blue: Without Cue Memory Recall Bed: Without Cue Attention: (P) Focused Focused Attention: Appears intact Awareness: Appears intact Problem Solving: Appears intact Safety/Judgment: Appears intact Sensation Sensation Light Touch: Appears Intact Light Touch Impaired Details: (P) Impaired RUE;Impaired LUE;Impaired RLE;Impaired LLE Hot/Cold: Appears Intact Proprioception: Not tested Stereognosis: Impaired by gross assessment Additional Comments: Pt can feel light touch on extremities but says they feel numb with tingling Coordination Gross Motor Movements are Fluid and Coordinated: No Fine Motor Movements are Fluid and Coordinated: No Coordination and Movement Description: (P) impaired 2/2 incomplete tetraplegia Finger Nose Finger Test: slow on R, unable to on L Motor  Motor Motor: Tetraplegia Motor - Skilled Clinical Observations: (P) incomplete tetraplegia Mobility  Bed Mobility Bed Mobility: (P) Rolling Right;Rolling Left;Supine to Sit;Sit to Supine Rolling Right: (P) Minimal Assistance -  Patient > 75% Rolling Left: (P) Maximal Assistance - Patient 25-49% Supine to Sit: (P) 2 Helpers Sit to Supine: (P) 2 Helpers Transfers Sit to Stand: (P) 2 Helpers  Trunk/Postural Assessment  Cervical Assessment Cervical Assessment: Exceptions to WFL(recent ACDF, cervical collar out of bed) Thoracic Assessment Thoracic Assessment: (P) Exceptions to WFL(rounded shoulders) Lumbar Assessment Lumbar Assessment: (P) Exceptions to WFL(posterior pelvic tilt) Postural Control Postural Control: Deficits on evaluation Trunk Control: min - mod unsupported sit balance  Balance Balance Balance Assessed: (P) Yes Static Sitting Balance Static Sitting - Level of Assistance: 3: Mod assist Dynamic Sitting Balance Dynamic Sitting - Level of Assistance: 1: +1 Total assist Extremity/Trunk Assessment RUE Assessment Passive Range of Motion (PROM) Comments: did not assess full shoulder ROM due to recent ACDF Active Range of Motion (AROM) Comments: shoulder flex and abd 80; distal WFL General Strength Comments: functional in elbow and hand - grasp 4+/5 LUE Assessment LUE Assessment: Exceptions to WFL Passive Range of Motion (PROM) Comments: unable to assess due to severe pain in L shoulder with passive movement, can tolerate up to 60 sh flexion LUE Body System: Ortho LUE AROM (degrees) Left Shoulder Flexion: 20 Degrees Left Shoulder ABduction: 60 Degrees Left Elbow Extension: -20 Left Composite Finger Extension: 75% Left Composite Finger Flexion: 25% LUE PROM (degrees) Left Shoulder ABduction: 60 Degrees Left Elbow Extension: -10 LUE Strength Left Wrist Extension: 3/5     Refer to Care Plan for Long Term Goals  Recommendations for other services: None    Discharge Criteria: Patient will be discharged from OT if patient refuses treatment 3 consecutive times without medical reason, if treatment goals not met, if there is a change in medical status, if patient makes no progress towards goals  or if patient is discharged from hospital.  The above assessment, treatment plan, treatment alternatives and goals were discussed and mutually agreed upon: by patient  SAGUIER,JULIA 08/10/2019, 12:42 PM  

## 2019-08-10 NOTE — Progress Notes (Signed)
Bilateral lower extremity venous duplex has been completed. Preliminary results can be found in CV Proc through chart review.   08/10/19 3:00 PM Olen Cordial RVT

## 2019-08-10 NOTE — Plan of Care (Signed)
  Problem: Consults Goal: RH SPINAL CORD INJURY PATIENT EDUCATION Description:  See Patient Education module for education specifics.  Outcome: Progressing Goal: Diabetes Guidelines if Diabetic/Glucose > 140 Description: If diabetic or lab glucose is > 140 mg/dl - Initiate Diabetes/Hyperglycemia Guidelines & Document Interventions  Outcome: Progressing   Problem: SCI BOWEL ELIMINATION Goal: RH STG MANAGE BOWEL WITH ASSISTANCE Description: STG Manage Bowel with mod I Assistance. Outcome: Progressing Goal: RH STG SCI MANAGE BOWEL WITH MEDICATION WITH ASSISTANCE Description: STG SCI Manage bowel with medication with mod I assistance. Outcome: Progressing   Problem: SCI BLADDER ELIMINATION Goal: RH STG MANAGE BLADDER WITH ASSISTANCE Description: STG Manage Bladder With mod I Assistance Outcome: Progressing   Problem: RH PAIN MANAGEMENT Goal: RH STG PAIN MANAGED AT OR BELOW PT'S PAIN GOAL Description: Pain level less than 4 on scale of 0-10 Outcome: Progressing   Problem: RH KNOWLEDGE DEFICIT SCI Goal: RH STG INCREASE KNOWLEDGE OF SELF CARE AFTER SCI Description: Pt and family will be able to adhere to medication regimen and dietary recommendations to regulate bowel and bladder independently. Pt and family will demonstrate understanding of spinal cord injury and precautions to take to prevent falls independently.    Outcome: Progressing

## 2019-08-10 NOTE — Progress Notes (Addendum)
Social Work Assessment and Plan   Patient Details  Name: Brian Daniels MRN: [id number] Date of Birth: [birth year]  Today's Date: 08/10/2019  Problem List:  Patient Active Problem List   Diagnosis Date Noted  . Neurogenic bladder 08/09/2019  . Spasticity 08/09/2019  . Stenosis of cervical spine with myelopathy ([place]) 08/05/2019  . Cervical myelopathy ([place]) 06/30/2019  . Bilateral low back pain without sciatica 06/13/2019  . Weakness 06/13/2019  . Paresthesia 06/13/2019  . Urinary urgency 06/13/2019  . Gait abnormality 06/13/2019   Past Medical History:  Past Medical History:  Diagnosis Date  . Cataracts, bilateral    removed by surgery  . Diabetes mellitus without complication ([place])    type 2  . Glaucoma    bilateral  . Gout   . Hypercholesteremia   . Hypertension   . Left atrial enlargement   . Legally blind    bilateral  . Neuropathy    hands, arm  . Numbness    hands, arms  . Wears partial dentures    lower  . Wheelchair bound    since 05/30/19   Past Surgical History:  Past Surgical History:  Procedure Laterality Date  . ANTERIOR CERVICAL DECOMPRESSION/DISCECTOMY FUSION 4 LEVELS N/A 08/05/2019   Procedure: ANTERIOR CERVICAL DECOMPRESSION/DISCECTOMY FUSION CERVICAL THREE- CERVICAL FOUR, CERVICAL FOUR- CERVICAL FIVE, CERVICAL FIVE- CERVICAL SIX, CERVICAL SIX- CERVICAL SEVEN;  Surgeon: Consuella Lose, MD;  Location: [facility];  Service: Neurosurgery;  Laterality: N/A;  anterior  . COLONOSCOPY    . EYE SURGERY Bilateral    multiple surgeries bilateral -   . MULTIPLE TOOTH EXTRACTIONS    . WISDOM TOOTH EXTRACTION     Social History:  reports that he has never smoked. He has never used smokeless tobacco. He reports previous alcohol use. He reports that he does not use drugs.  Family / Support Systems Marital Status: Divorced Spouse/Significant Other: Divorced Children: No children Other Supports: His sister [name] Anticipated Caregiver: Pt reports his sister  [name] will be his primary caregiver Ability/Limitations of Caregiver: Unknown at this time Caregiver Availability: 24/7 Family Dynamics: Pt lives with his sister here in [place]. Pt moved from [place] last year.  Social History Preferred language: English Religion:  Cultural Background: Pt worked at [employer]. Pt worked there for 24 yrs until 2003 when he was diagnosed with Glaucoma and Education: Programmer, systems Read: Yes Write: Yes Employment Status: Disabled Date Retired/Disabled/Unemployed: 2003 Age Retired: 37 Public relations account executive Issues: N/A Guardian/Conservator: N/A   Abuse/Neglect Abuse/Neglect Assessment Can Be Completed: Yes Physical Abuse: Denies Verbal Abuse: Denies Sexual Abuse: Denies Exploitation of patient/patient's resources: Denies Self-Neglect: Denies  Emotional Status Pt's affect, behavior and adjustment status: Pt in good spirits. Pt has been adjusting to loss of vision for the last 18 yrs. Pt does not present with any depression. Recent Psychosocial Issues: None Psychiatric History: None Substance Abuse History: Pt admits he used to drink beer and quit in 2006 after becoming sick and was admitted to hospital. Pt does not report his illness was in relation to drinking. He also quit smoking cigarettes at this time as well.  Patient / Family Perceptions, Expectations & Goals Pt/Family understanding of illness & functional limitations: Patient family has a general understanding of pt care needs Premorbid pt/family roles/activities: PTA pt was living with his sister who was providing 24/7 care Anticipated changes in roles/activities/participation: Pt will continue to require 24/7 care due to physical limitations Pt/family expectations/goals: Pt would like to resume prior level of  functioning if possible.  Community Resources [employer] Scripts: None Premorbid Home Care/DME Agencies: None Transportation available at discharge: Sister will  transport to home at discharge per pt reports  Discharge Planning Living Arrangements: Other relatives Support Systems: Other relatives Type of Residence: Private residence Insurance Resources: Multimedia programmer (specify)([employer]) Financial Resources: [employer] Screen Referred: No Living Expenses: Lives with family Money Management: Patient, Family Does the patient have any problems obtaining your medications?: No Home Management: Pt sister will continue to assist with his care needs  Clinical Impression SW met with pt in room at bedside to introduce self, explain role, and discuss discharge process. Pt intends to discharge to home with support from his sister. Pt admits he is a veteran:[employer] [id number]. Pt does not have a HCPOA. Pt only DME is a w/c. Pt gave SW permission to discuss with his sister d/c plan in the event he requires more physical support at discharge.   SW left message for pt sister [name] [phone]). SW waiting on follow-up.   Loralee Pacas, MSW Office: [phone] Cell: [phone] 08/10/2019, 11:22 AM

## 2019-08-11 ENCOUNTER — Inpatient Hospital Stay (HOSPITAL_COMMUNITY): Payer: Medicare Other | Admitting: *Deleted

## 2019-08-11 ENCOUNTER — Inpatient Hospital Stay (HOSPITAL_COMMUNITY): Payer: Medicare Other | Admitting: Physical Therapy

## 2019-08-11 DIAGNOSIS — H547 Unspecified visual loss: Secondary | ICD-10-CM

## 2019-08-11 DIAGNOSIS — G959 Disease of spinal cord, unspecified: Secondary | ICD-10-CM

## 2019-08-11 LAB — GLUCOSE, CAPILLARY
Glucose-Capillary: 118 mg/dL — ABNORMAL HIGH (ref 70–99)
Glucose-Capillary: 123 mg/dL — ABNORMAL HIGH (ref 70–99)
Glucose-Capillary: 97 mg/dL (ref 70–99)
Glucose-Capillary: 135 mg/dL — ABNORMAL HIGH (ref 70–99)

## 2019-08-11 MED ORDER — LIDOCAINE 5 % EX PTCH
2.0000 | MEDICATED_PATCH | CUTANEOUS | Status: DC
  Administered 2019-08-11 – 2019-08-26 (×16): 2 via TRANSDERMAL
  Filled 2019-08-11 (×16): qty 2

## 2019-08-11 NOTE — Care Management (Signed)
[facility] Individual Statement of Services  Patient Name:  Brian Daniels  Date:  08/11/2019  Welcome to the [facility].  Our goal is to provide you with an individualized program based on your diagnosis and situation, designed to meet your specific needs.  With this comprehensive rehabilitation program, you will be expected to participate in at least 3 hours of rehabilitation therapies Monday-Friday, with modified therapy programming on the weekends.  Your rehabilitation program will include the following services:  Physical Therapy (PT), Occupational Therapy (OT), 24 hour per day rehabilitation nursing, Therapeutic Recreaction (TR), Neuropsychology, Case Management (Social Worker), Rehabilitation Medicine, Nutrition Services and Pharmacy Services  Weekly team conferences will be held on Tuesdays to discuss your progress.  Your Social Worker will talk with you frequently to get your input and to update you on team discussions.  Team conferences with you and your family in attendance may also be held.  Expected length of stay: 21-24 days  Overall anticipated outcome: Discharge to home  Depending on your progress and recovery, your program may change. Your Social Worker will coordinate services and will keep you informed of any changes. Your Social Worker's name and contact numbers are listed  below.  The following services may also be recommended but are not provided by the [facility]:   Driving Evaluations  Home Health Rehabiltiation Services  Outpatient Rehabilitation Services  Vocational Rehabilitation   Arrangements will be made to provide these services after discharge if needed.  Arrangements include referral to agencies that provide these services.  Your insurance has been verified to be:  [employer]  Your primary doctor is:  Georgianne Fick  Pertinent information will be shared with your doctor and your insurance  company.  Social Worker:  Cecile Sheerer, MSW  Information discussed with and copy given to patient by: Gretchen Short, 08/11/2019, 8:49 AM

## 2019-08-11 NOTE — Progress Notes (Signed)
Physical Therapy Session Note  Patient Details  Name: Brian Daniels MRN: [id number] Date of Birth: 01-20-1956  Today's Date: 08/11/2019 PT Individual Time: 0900-1000; 1345-1500 PT Individual Time Calculation (min): 60 min and 75 min  Short Term Goals: Week 1:  PT Short Term Goal 1 (Week 1): Pt will complete bed mobility with assist x 1 consistently PT Short Term Goal 2 (Week 1): Pt will perform least restrictive transfer with assist x 1 consistently PT Short Term Goal 3 (Week 1): Pt will initiate gait training as safe and able  Skilled Therapeutic Interventions/Progress Updates:    Session 1: Patient received seated in w/c in room, agreeable to PT session. Pt reports pain in L shoulder is well controlled this AM. Seated BLE strengthening therex while seated in w/c: marches, LAQ, heel/toe raises x 10 reps each. Seated B HS stretch 3 x 60 sec each. Sit to stand with assist x 2 to L PFRW. Pt is able to take a few steps in place while standing with PFRW and with mod A for standing balance. Pt is then able to ambulate x 5 ft with L PFRW and assist x 2 for safety and balance, close w/c follow. Pt exhibits decreased LLE step length and hip flexion during gait and increased L knee flexion in stance phase. Pt fatigues quickly with standing activity. Pt requests to return to bed at end of session. Sit to stand to stedy with mod A from w/c seat height. Stedy transfer back to bed. Sit to supine mod A for BLE management. Pt left supine in bed with needs in reach at end of session.  Session 2: Pt received seated in bed, agreeable to PT session. No complaints of pain. Pt's sister [name] present and able to observe part of patient's therapy session while still in patient room. Pt is mod A for bed mobility. Sit to stand with mod A to stedy. Stedy transfer to w/c. Session focus on trialing different transfer methods and gait training. Trialed slide board transfer, pt is min to mod A to complete transfer with max  cueing for setup, safety, and sequencing of transfer due to visual deficits. Sit to stand with max A to PFRW. Stand pivot transfer mat table to w/c with PFRW and max A overall with max cueing for sequencing and safety. Ambulation 2 x 10 ft forwards/backwards with PFRW and max A for standing balance and for management of RW. Ambulation 2 x 20 ft with RW and PFRW with max A for balance and RW management, close w/c follow for safety. Pt exhibits ataxic BLE, narrow BOS, and onset of lateral lean to the R with onset of fatigue. Stedy transfer back to bed at end of session per pt request. Sit to supine with mod A for BLE management. Pt left semi-reclined in bed with needs in reach, bed alarm in place, sister present.  Therapy Documentation Precautions:  Precautions Precautions: Fall Precaution Comments: pt legally blind in both eyes Required Braces or Orthoses: Cervical Brace Cervical Brace: Hard collar Restrictions Weight Bearing Restrictions: No    Therapy/Group: Individual Therapy   Peter Congo, PT, DPT  08/11/2019, 3:25 PM

## 2019-08-11 NOTE — Progress Notes (Signed)
Occupational Therapy Session Note  Patient Details  Name: Brian Daniels MRN: [id number] Date of Birth: [birth year]  Today's Date: 08/11/2019 OT Individual Time: 0700-0800 OT Individual Time Calculation (min): 60 min    Short Term Goals: Week 1:  OT Short Term Goal 1 (Week 1): Pt will be able to maintain static sitting with S to increase safety to be able to sit on a tub bench. OT Short Term Goal 2 (Week 1): Pt will be able to don a shirt with min A OT Short Term Goal 3 (Week 1): Pt will be able to rise to stand with max A of 1. OT Short Term Goal 4 (Week 1): Pt will be able to transfer to [facility] with max A of 1.  Skilled Therapeutic Interventions/Progress Updates:    PT resting in bed upon arrival and agreeable to getting OOB for bathing/dressing.  OT intervention with focus on bed mobility, sitting balance, sit<>stand with [place], standing balance with [place], BADL retraining, toileting, and activity tolerance to increase independence with BADLs. Max A for supine>sit EOB and min A for sitting balance EOB in preparation for sit<>stand and transfer with [place]. Pt requested to use BSC. Pt dependent for hygiene standing in [place]. Pt engaged in bathing/dressing tasks with sit<>stand in [place] (See Care Tool for function). Pt requires max tactile cues for bathing/dressing tasks.  Pt states his BUE are numb (L>R) and he relied on his touch/feeling to compensate for visual deficits. Sit<>stand in [place] with min A. Pt remained in w/c with belt alarm activated and soft call bell in pt's lap.  Pt return demonstrated use of soft call bell. NT entered room to assist with breakfast.   Therapy Documentation Precautions:  Precautions Precautions: Fall Precaution Comments: pt legally blind in both eyes Required Braces or Orthoses: Cervical Brace Cervical Brace: Hard collar Restrictions Weight Bearing Restrictions: No Pain: Pt c/o L shoulder pain and L neck (unrated); MD aware and repositioned  Therapy/Group:  Individual Therapy  Rich Brave 08/11/2019, 8:36 AM

## 2019-08-11 NOTE — Progress Notes (Signed)
[facility] PHYSICAL MEDICINE & REHABILITATION PROGRESS NOTE   Subjective/Complaints:  Tight muscles in his L shoulder, upper traps/leavors/scalenes- esp with movement and when gets OOB. Willing to try Lidoderm for tight muscles.   ROS: patient denies SOB, CP, abd pain, vision changes (is blind) N/V/D/Constipation.   Objective:   VAS [place] LOWER EXTREMITY VENOUS (DVT)  Result Date: 08/10/2019  Lower Venous Study Indications: Swelling.  Risk Factors: None identified. Comparison Study: No prior studies. Performing Technologist: Chanda Busing RVT  Examination Guidelines: A complete evaluation includes B-mode imaging, spectral Doppler, color Doppler, and power Doppler as needed of all accessible portions of each vessel. Bilateral testing is considered an integral part of a complete examination. Limited examinations for reoccurring indications may be performed as noted.  +---------+---------------+---------+-----------+----------+--------------+ RIGHT    CompressibilityPhasicitySpontaneityPropertiesThrombus Aging +---------+---------------+---------+-----------+----------+--------------+ CFV      Full           Yes      Yes                                 +---------+---------------+---------+-----------+----------+--------------+ SFJ      Full                                                        +---------+---------------+---------+-----------+----------+--------------+ FV Prox  Full                                                        +---------+---------------+---------+-----------+----------+--------------+ FV Mid   Full                                                        +---------+---------------+---------+-----------+----------+--------------+ FV DistalFull                                                        +---------+---------------+---------+-----------+----------+--------------+ PFV      Full                                                         +---------+---------------+---------+-----------+----------+--------------+ POP      Full           Yes      Yes                                 +---------+---------------+---------+-----------+----------+--------------+ PTV      Full                                                        +---------+---------------+---------+-----------+----------+--------------+  PERO     Full                                                        +---------+---------------+---------+-----------+----------+--------------+   +---------+---------------+---------+-----------+----------+--------------+ LEFT     CompressibilityPhasicitySpontaneityPropertiesThrombus Aging +---------+---------------+---------+-----------+----------+--------------+ CFV      Full           Yes      Yes                                 +---------+---------------+---------+-----------+----------+--------------+ SFJ      Full                                                        +---------+---------------+---------+-----------+----------+--------------+ FV Prox  Full                                                        +---------+---------------+---------+-----------+----------+--------------+ FV Mid   Full                                                        +---------+---------------+---------+-----------+----------+--------------+ FV DistalFull                                                        +---------+---------------+---------+-----------+----------+--------------+ PFV      Full                                                        +---------+---------------+---------+-----------+----------+--------------+ POP      Full           Yes      Yes                                 +---------+---------------+---------+-----------+----------+--------------+ PTV      Full                                                         +---------+---------------+---------+-----------+----------+--------------+ PERO     Full                                                        +---------+---------------+---------+-----------+----------+--------------+  Summary: Right: There is no evidence of deep vein thrombosis in the lower extremity. No cystic structure found in the popliteal fossa. Left: There is no evidence of deep vein thrombosis in the lower extremity. No cystic structure found in the popliteal fossa.  *See table(s) above for measurements and observations.    Preliminary    Recent Labs    08/10/19 0510  WBC 7.6  HGB 12.6*  HCT 39.1  PLT 191   Recent Labs    08/10/19 0510  NA 140  K 3.4*  CL 103  CO2 27  GLUCOSE 97  BUN 15  CREATININE 0.76  CALCIUM 9.0    Intake/Output Summary (Last 24 hours) at 08/11/2019 0844 Last data filed at 08/11/2019 0827 Gross per 24 hour  Intake 440 ml  Output 1375 ml  Net -935 ml     Physical Exam: Vital Signs Blood pressure (!) 158/82, pulse 64, temperature 98.4 F (36.9 C), temperature source Oral, resp. rate 18, height 5\' 10"  (1.778 m), weight 71.6 kg, SpO2 99 %.  Constitutional: He appears well-developed and well-nourished.  Wearing dark glasses; sitting up in manual w/c in room- cramped, OT and OTA in room as well, NAD  HENT:  Head: Normocephalic and atraumatic.  Eyes: wearing sunglasses   Neck:  ACDF incision site clean and dry with honeycomb dressing on R anterior neck.wearing hard cervical collar  Decreased cervical rotation; tight trigger points in L upper traps, levators and scalenes as well as rhomboids.   Cardiovascular: RRR  Respiratory: CTA B/L no W/R/R  GI: Soft, NT, ND, hypoactive BS  Genitourinary: Genitourinary Comments: Condom catheter in place  Musculoskeletal:  Cervical back: Neck supple.  Comments: RUE- bicep 4/5, WE 4/5, triceps 4+/5, grip 4-/5, finger 4+/5 LUE- Biceps 2+/5, WE 2+/5, triceps 2+/5, grip 3/5, finger 3/5 RLE- HF 4-/5,  KE 4/5, DF 4=/5, PF 4-/5 LLE- HF 2/5, KE 2/5, DF 2/5, PF 2-/5 Neurological:  Hoffman's LUE- strong; (-) Hoffman's in RUE no clonus in LEs B/L Sensation intact at C3 B/L; otherwise decreased at every dermatome to L3 B/L ; L4-S2 dermatomes B/L very decreased sensation- same S3-S5 B/L  Skin:  IV in R hand- no signs of infiltration No signs of skin breakdown seen except incision on neck  Psychiatric:  Very appropriate and pleasant    Assessment/Plan: 1. Functional deficits secondary to C3 ASIA C/D quadriplegia  which require 3+ hours per day of interdisciplinary therapy in a comprehensive inpatient rehab setting.  Physiatrist is providing close team supervision and 24 hour management of active medical problems listed below.  Physiatrist and rehab team continue to assess barriers to discharge/monitor patient progress toward functional and medical goals  Care Tool:  Bathing    Body parts bathed by patient: Chest, Abdomen, Face, Front perineal area   Body parts bathed by helper: Right arm, Buttocks, Right upper leg, Left upper leg, Right lower leg, Left lower leg     Bathing assist Assist Level: Maximal Assistance - Patient 24 - 49%     Upper Body Dressing/Undressing Upper body dressing   What is the patient wearing?: Pull over shirt, Orthosis    Upper body assist Assist Level: Maximal Assistance - Patient 25 - 49%    Lower Body Dressing/Undressing Lower body dressing      What is the patient wearing?: Incontinence brief, Pants     Lower body assist Assist for lower body dressing: Dependent - Patient 0%     Toileting Toileting    Toileting assist  Assist for toileting: Dependent - Patient 0%     Transfers Chair/bed transfer  Transfers assist     Chair/bed transfer assist level: Dependent - mechanical lift     Locomotion Ambulation   Ambulation assist   Ambulation activity did not occur: Safety/medical concerns          Walk 10 feet  activity   Assist  Walk 10 feet activity did not occur: Safety/medical concerns        Walk 50 feet activity   Assist Walk 50 feet with 2 turns activity did not occur: Safety/medical concerns         Walk 150 feet activity   Assist Walk 150 feet activity did not occur: Safety/medical concerns         Walk 10 feet on uneven surface  activity   Assist Walk 10 feet on uneven surfaces activity did not occur: Safety/medical concerns         Wheelchair     Assist Will patient use wheelchair at discharge?: Yes Type of Wheelchair: Manual Wheelchair activity did not occur: Safety/medical concerns         Wheelchair 50 feet with 2 turns activity    Assist    Wheelchair 50 feet with 2 turns activity did not occur: Safety/medical concerns       Wheelchair 150 feet activity     Assist  Wheelchair 150 feet activity did not occur: Safety/medical concerns       Blood pressure (!) 158/82, pulse 64, temperature 98.4 F (36.9 C), temperature source Oral, resp. rate 18, height 5\' 10"  (1.778 m), weight 71.6 kg, SpO2 99 %.    Medical Problem List and Plan:  1. Bilateral upper and lower extremity weakness with paresthesias left greater than right with gait disturbance secondary to cervical myelopathy.S/P multilevel discectomy C3-4, 4-5, 5-6 and C6-7 with decompression of spinal cord and exiting nerve roots with arthrodesis 08/05/2019 per Dr. 08/07/2019. HARD Cervical collar when out of bed.  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  -patient may Shower if covers neck incision  -ELOS/Goals: Supervision to Min assist - 3-3.5 weeks due to SCI  2. Antithrombotics:  -DVT/anticoagulation: SCDs. Discuss Lovenox for DVT prophylaxis with neurosurgery. Check vascular study   1/27- will start lovenox due to increased risk of DVT -antiplatelet therapy: Aspirin 81 mg to begin 08/12/2019  3. Pain Management: Post op pain, nerve pain and  spasticity- Hydrocodone/Robaxin as needed  -will try and change to Baclofen for muscle spasms/spasticity once gets to CIR and possibly add Duloxetine for nerve pain   1/27- added baclofen 5 mg TID for spasticity and Duloxetine 30 mg QHS for nerve pain  1/28- add Lidoderm patches to L neck during day. 12 hrs on;12 hrs off 4. Mood: Provide emotional support  -antipsychotic agents: N/A  5. Neuropsych: This patient is capable of making decisions on his own behalf.  6. Skin/Wound Care: Routine skin checks  7. Fluids/Electrolytes/Nutrition: Routine in and outs with follow-up chemistries  8. Hypertension. Toprol-XL 25 mg daily, Lotensin 10 mg daily, Norvasc 5 mg daily. Monitor with increased mobility  9. Diabetes mellitus. Hemoglobin A1c 6.3. Glucophage 500 mg daily. Check blood sugars before meals and at bedtime   CBG (last 3)  Recent Labs    08/10/19 1633 08/10/19 2104 08/11/19 0628  GLUCAP 116* 110* 118*    1/28- BGs well controlled  10. Glaucoma/legally blind. Continue eyedrops as directed- continue pt's dark sunglasses for light sensitivity.  11. Hyperlipidemia.  Lipitor  12. Constipation in setting of Neurogenic bowel. Adjust bowel program as needed.  1/28- LBM early AM 1/27 13. Neurogenic bladder- will check PVRS to make sure is emptying since needed a in/out cath due to lack of voiding 2 days ago.  1/27- ordered PVRs   14/ Hypokalemia  1/27- K+ 3.4- will replete  1/28- labs in AM  LOS: 2 days A FACE TO FACE EVALUATION WAS PERFORMED  Ziad Maye 08/11/2019, 8:44 AM

## 2019-08-12 ENCOUNTER — Inpatient Hospital Stay (HOSPITAL_COMMUNITY): Payer: Medicare Other

## 2019-08-12 ENCOUNTER — Inpatient Hospital Stay (HOSPITAL_COMMUNITY): Payer: Medicare Other | Admitting: *Deleted

## 2019-08-12 ENCOUNTER — Inpatient Hospital Stay (HOSPITAL_COMMUNITY): Payer: Medicare Other | Admitting: Physical Therapy

## 2019-08-12 LAB — BASIC METABOLIC PANEL
Anion gap: 12 (ref 5–15)
BUN: 16 mg/dL (ref 8–23)
CO2: 27 mmol/L (ref 22–32)
Calcium: 9.5 mg/dL (ref 8.9–10.3)
Chloride: 101 mmol/L (ref 98–111)
Creatinine, Ser: 0.77 mg/dL (ref 0.61–1.24)
GFR calc Af Amer: >60 mL/min (ref >60)
GFR calc non Af Amer: >60 mL/min (ref >60)
Glucose, Bld: 103 mg/dL — ABNORMAL HIGH (ref 70–99)
Potassium: 3.8 mmol/L (ref 3.5–5.1)
Sodium: 140 mmol/L (ref 135–145)

## 2019-08-12 LAB — GLUCOSE, CAPILLARY
Glucose-Capillary: 92 mg/dL (ref 70–99)
Glucose-Capillary: 126 mg/dL — ABNORMAL HIGH (ref 70–99)
Glucose-Capillary: 85 mg/dL (ref 70–99)
Glucose-Capillary: 132 mg/dL — ABNORMAL HIGH (ref 70–99)

## 2019-08-12 MED ORDER — TAMSULOSIN HCL 0.4 MG PO CAPS
0.4000 mg | ORAL_CAPSULE | Freq: Every day | ORAL | Status: DC
  Administered 2019-08-12 – 2019-08-24 (×13): 0.4 mg via ORAL
  Filled 2019-08-12 (×14): qty 1

## 2019-08-12 NOTE — Progress Notes (Signed)
Physical Therapy Session Note  Patient Details  Name: Brian Daniels MRN: [id number] Date of Birth: [birth year]  Today's Date: 08/12/2019 PT Individual Time: 1335-1433 PT Individual Time Calculation (min): 58 min   Short Term Goals: Week 1:  PT Short Term Goal 1 (Week 1): Pt will complete bed mobility with assist x 1 consistently PT Short Term Goal 2 (Week 1): Pt will perform least restrictive transfer with assist x 1 consistently PT Short Term Goal 3 (Week 1): Pt will initiate gait training as safe and able  Skilled Therapeutic Interventions/Progress Updates:    Functional bed mobility with HOB elevated for to come to EOB with min to mod assist for trunk support and then to scoot L hip forward to position both feet on floor. Total assist to don cervical collar and shoes EOB (pt able to maintain sitting balance with supervision with UE support while donning of shoes). Attempted to use urinal prior to transfer to w/c without success as pt reports having urinary frequency. Performed max assist squat pivot from bed to w/c with cues for hand placement and technique. Focused on NMR during gait training for postural control re-training, sit <> stands, and coordination of BLE during gait trials x 27' and x 18' with +2 assist needed for safety. Noted narrow BOS, ataxia in BLE, decreased ability to control positioning of PFRW due to UE weakness (L>R), decreased LLE heel strike and hip flexion as well as decreased extension in stance without cues and impaired postural control. Mod assist squat pivot transfer w/c <> Nustep with focus on technique and pushing through BLE to increase clearance for bottom. NMR on Nustep for reciprocal movement pattern retraining, motor control and muscle activation in BLE/BLE (L>R), and overall endurance 7 min. Pt requires verbal cues throughout due to visual deficits.  Therapy Documentation Precautions:  Precautions Precautions: Fall Precaution Comments: pt legally blind in  both eyes Required Braces or Orthoses: Cervical Brace Cervical Brace: Hard collar Restrictions Weight Bearing Restrictions: No   Pain:  Denies pain.    Therapy/Group: Individual Therapy  [name] [name], PT, DPT, CBIS  08/12/2019, 3:25 PM

## 2019-08-12 NOTE — Progress Notes (Signed)
Occupational Therapy Session Note  Patient Details  Name: Brian Daniels MRN: [id number] Date of Birth: 1956/04/10  Today's Date: 08/12/2019 OT Individual Time: 0700-0755 OT Individual Time Calculation (min): 55 min    Short Term Goals: Week 1:  OT Short Term Goal 1 (Week 1): Pt will be able to maintain static sitting with S to increase safety to be able to sit on a tub bench. OT Short Term Goal 2 (Week 1): Pt will be able to don a shirt with min A OT Short Term Goal 3 (Week 1): Pt will be able to rise to stand with max A of 1. OT Short Term Goal 4 (Week 1): Pt will be able to transfer to [facility] with max A of 1.  Skilled Therapeutic Interventions/Progress Updates:    Pt resting in bed upon arrival and ready to get OOB. OT intervention with focus on bed mobility, sit<>stand in [place], BADL training, sitting balance, standing balance, and activity tolerance to increase independence with BADLs. Pt required mod A for supine>sit EOB.  Sitting balance with close supervision (improvement from previous day). Initial sit<>stand in [place] from EOB with max A. Subsequent sit<>stand progressed to min A in [place]. Pt engaged in bathing/dressing with sit<>stand in [place]. Pt with limited LUE AROM and functional movement and requires increased assistance with bathing/dressing tasks (see Care Tool). Pt remained in w/c with belt alarm activated. Nursing staff notifed that pt was ready to eat breakfast and will need assistance.   Therapy Documentation Precautions:  Precautions Precautions: Fall Precaution Comments: pt legally blind in both eyes Required Braces or Orthoses: Cervical Brace Cervical Brace: Hard collar Restrictions Weight Bearing Restrictions: No  Pain: Pt c/o L shoulder pain but stated he already had meds eariler. Pt commented that the pain was better as session progressed. Pt with increase ROM as session progressed  Therapy/Group: Individual Therapy  Rich Brave 08/12/2019, 8:01  AM

## 2019-08-12 NOTE — Progress Notes (Signed)
Physical Therapy Session Note  Patient Details  Name: Brian Daniels MRN: [id number] Date of Birth: 20-Dec-1955  Today's Date: 08/12/2019 PT Individual Time: 0900-1000 PT Individual Time Calculation (min): 60 min   Short Term Goals: Week 1:  PT Short Term Goal 1 (Week 1): Pt will complete bed mobility with assist x 1 consistently PT Short Term Goal 2 (Week 1): Pt will perform least restrictive transfer with assist x 1 consistently PT Short Term Goal 3 (Week 1): Pt will initiate gait training as safe and able  Skilled Therapeutic Interventions/Progress Updates:    Pt received seated in w/c in room, agreeable to PT session. Pt reports some pain/soreness in L shoulder this AM, not rated. RN able to apply lidocaine patch to L shoulder during session. Sit to stand with max A to PFRW, max cueing for positioning and safe sequencing of transfer. Standing alt L/R marches in place with focus on lateral weight shifting and upright standing. Sit to stand with mod A to B stair handrails. Standing alt L/R 3" step-taps with BUE support and mod A for standing balance, 2 x 10 reps each. Pt exhibits decrease in L hip flexion with onset of fatigue. Standing mini-squats 2 x 10 reps with BUE support on stair handrails and mod A for balance, focus on using LE musculature to return to full stand vs reliance on BUE. Pt reports urge to urinate, is able to use urinal with setup A. Pt requests to return to bed at end of session. Squat pivot transfer to the R with mod A from w/c to bed with max cueing for positioning due to visual deficits. Sit to supine mod A for BLE management. Pt left semi-reclined in bed with needs in reach at end of session.  Therapy Documentation Precautions:  Precautions Precautions: Fall Precaution Comments: pt legally blind in both eyes Required Braces or Orthoses: Cervical Brace Cervical Brace: Hard collar Restrictions Weight Bearing Restrictions: No    Therapy/Group: Individual  Therapy   Peter Congo, PT, DPT  08/12/2019, 11:32 AM

## 2019-08-12 NOTE — IPOC Note (Signed)
Overall Plan of Care [facility]) Patient Details Name: Brian Daniels MRN: [id number] DOB: 1956-06-25  Admitting Diagnosis: Cervical myelopathy [facility])  Hospital Problems: Principal Problem:   Cervical myelopathy ([place]) Active Problems:   Weakness   Paresthesia   Urinary urgency   Neurogenic bladder   Spasticity   Blindness     Functional Problem List: Nursing Bowel, Endurance, Motor, Nutrition, Pain, Safety  PT Balance, Endurance, Motor, Pain, Safety, Sensory  OT Balance, Endurance, Motor, Vision, Sensory, Pain  SLP    TR         Basic ADL's: OT Eating, Grooming, Bathing, Dressing, Toileting     Advanced  ADL's: OT       Transfers: PT Bed Mobility, Bed to Chair, Car, Furniture, Floor  OT Toilet, Metallurgist: PT Ambulation, Emergency planning/management officer, Stairs     Additional Impairments: OT Fuctional Use of Upper Extremity  SLP        TR      Anticipated Outcomes Item Anticipated Outcome  Self Feeding min A  Swallowing      Basic self-care  min A  Toileting  min A   Bathroom Transfers min A  Bowel/Bladder  manage bowel and bladder with mod I assist  Transfers  min A  Locomotion  min A overall with LRAD  Communication     Cognition     Pain  pain level less than 4 on scale of 0-10  Safety/Judgment  remain free of injury, prevent falls with cues and reminders   Therapy Plan: PT Intensity: Minimum of 1-2 x/day ,45 to 90 minutes PT Frequency: 5 out of 7 days PT Duration Estimated Length of Stay: 21-24 days OT Intensity: Minimum of 1-2 x/day, 45 to 90 minutes OT Frequency: 5 out of 7 days OT Duration/Estimated Length of Stay: 21-23 days     Due to the current state of emergency, patients may not be receiving their 3-hours of Medicare-mandated therapy.   Team Interventions: Nursing Interventions Patient/Family Education, Pain Management, Bladder Management, Medication Management, Bowel Management, Disease Management/Prevention, Psychosocial  Support  PT interventions Ambulation/gait training, Training and development officer, Community reintegration, Discharge planning, DME/adaptive equipment instruction, Functional electrical stimulation, Functional mobility training, Neuromuscular re-education, Pain management, Patient/family education, Psychosocial support, Splinting/orthotics, Stair training, Therapeutic Activities, Therapeutic Exercise, UE/LE Strength taining/ROM, UE/LE Coordination activities, Wheelchair propulsion/positioning  OT Interventions Balance/vestibular training, Discharge planning, DME/adaptive equipment instruction, Functional mobility training, Pain management, Neuromuscular re-education, Patient/family education, Self Care/advanced ADL retraining, Therapeutic Activities, Psychosocial support, UE/LE Strength taining/ROM, Therapeutic Exercise, UE/LE Coordination activities, Visual/perceptual remediation/compensation  SLP Interventions    TR Interventions    SW/CM Interventions Discharge Planning, Psychosocial Support, Patient/Family Education   Barriers to Discharge MD  Medical stability, Home enviroment access/loayout, Neurogenic bowel and bladder, Lack of/limited family support, Weight bearing restrictions and blind B/L  Nursing      PT Medical stability    OT      SLP      SW       Team Discharge Planning: Destination: PT-Home ,OT- Home , SLP-  Projected Follow-up: PT-Home health PT, OT-  Home health OT, SLP-  Projected Equipment Needs: PT-To be determined, OT- Tub/shower bench, 3 in 1 bedside comode, SLP-  Equipment Details: PT-TBD pending progress, OT-  Patient/family involved in discharge planning: PT- Patient,  OT-Patient, SLP-   MD ELOS: 21-24 days Medical Rehab Prognosis:  Good Assessment: Patient is a 64 yr old male with C3 ASIA C/D- nontraumatic s/p C3-7 ACDF with hard cervical collar when  OOB; increased risk of DVT- started lovenox; added baclofen for spasticity and duloxetine for nerve pain; also  has Norco for prn pain; on 3 meds for HTN; and on Metformin BID for DM- A1c of 6.3; is legally blind from glaucoma. Also having neurogenic bladder Sx's from SCI and possible neurogenic bowel with constipation.  Also having Hypokalemia as well- repleted.  Goals Min assist    See Team Conference Notes for weekly updates to the plan of care

## 2019-08-12 NOTE — Progress Notes (Signed)
[place] PHYSICAL MEDICINE & REHABILITATION PROGRESS NOTE   Subjective/Complaints:  Pt reports Ktaping and lidoderm patches are helpful for L shoulder pain/tightness.    Also voiding a LOT- small amounts- feels like not emptying since needs to double void to feel empty or close to empty.   Says volumes ~ 175cc or less.   ROS: patient denies SOB, CP, abd pain, vision changes (is blind) N/V/D/Constipation.   Objective:   VAS [place] LOWER EXTREMITY VENOUS (DVT)  Result Date: 08/11/2019  Lower Venous Study Indications: Swelling.  Risk Factors: None identified. Comparison Study: No prior studies. Performing Technologist: Oliver Hum RVT  Examination Guidelines: A complete evaluation includes B-mode imaging, spectral Doppler, color Doppler, and power Doppler as needed of all accessible portions of each vessel. Bilateral testing is considered an integral part of a complete examination. Limited examinations for reoccurring indications may be performed as noted.  +---------+---------------+---------+-----------+----------+--------------+ RIGHT    CompressibilityPhasicitySpontaneityPropertiesThrombus Aging +---------+---------------+---------+-----------+----------+--------------+ CFV      Full           Yes      Yes                                 +---------+---------------+---------+-----------+----------+--------------+ SFJ      Full                                                        +---------+---------------+---------+-----------+----------+--------------+ FV Prox  Full                                                        +---------+---------------+---------+-----------+----------+--------------+ FV Mid   Full                                                        +---------+---------------+---------+-----------+----------+--------------+ FV DistalFull                                                         +---------+---------------+---------+-----------+----------+--------------+ PFV      Full                                                        +---------+---------------+---------+-----------+----------+--------------+ POP      Full           Yes      Yes                                 +---------+---------------+---------+-----------+----------+--------------+ PTV      Full                                                        +---------+---------------+---------+-----------+----------+--------------+  PERO     Full                                                        +---------+---------------+---------+-----------+----------+--------------+   +---------+---------------+---------+-----------+----------+--------------+ LEFT     CompressibilityPhasicitySpontaneityPropertiesThrombus Aging +---------+---------------+---------+-----------+----------+--------------+ CFV      Full           Yes      Yes                                 +---------+---------------+---------+-----------+----------+--------------+ SFJ      Full                                                        +---------+---------------+---------+-----------+----------+--------------+ FV Prox  Full                                                        +---------+---------------+---------+-----------+----------+--------------+ FV Mid   Full                                                        +---------+---------------+---------+-----------+----------+--------------+ FV DistalFull                                                        +---------+---------------+---------+-----------+----------+--------------+ PFV      Full                                                        +---------+---------------+---------+-----------+----------+--------------+ POP      Full           Yes      Yes                                  +---------+---------------+---------+-----------+----------+--------------+ PTV      Full                                                        +---------+---------------+---------+-----------+----------+--------------+ PERO     Full                                                        +---------+---------------+---------+-----------+----------+--------------+  Summary: Right: There is no evidence of deep vein thrombosis in the lower extremity. No cystic structure found in the popliteal fossa. Left: There is no evidence of deep vein thrombosis in the lower extremity. No cystic structure found in the popliteal fossa.  *See table(s) above for measurements and observations. Electronically signed by Sherald Hess MD on 08/11/2019 at 4:11:37 PM.    Final    Recent Labs    08/10/19 0510  WBC 7.6  HGB 12.6*  HCT 39.1  PLT 191   Recent Labs    08/10/19 0510 08/12/19 0526  NA 140 140  K 3.4* 3.8  CL 103 101  CO2 27 27  GLUCOSE 97 103*  BUN 15 16  CREATININE 0.76 0.77  CALCIUM 9.0 9.5    Intake/Output Summary (Last 24 hours) at 08/12/2019 0817 Last data filed at 08/12/2019 0520 Gross per 24 hour  Intake 840 ml  Output 1450 ml  Net -610 ml     Physical Exam: Vital Signs Blood pressure (!) 153/77, pulse (!) 54, temperature 97.6 F (36.4 C), temperature source Oral, resp. rate 18, height 5\' 10"  (1.778 m), weight 71.6 kg, SpO2 99 %.  Constitutional: He appears well-developed and well-nourished.  Wearing dark glasses; sitting up in manual w/c in room-with OT- working on ADLs; appears more comfortable today;  NAD  HENT:  Head: Normocephalic and atraumatic.  Eyes: wearing sunglasses   Neck:  ACDF incision site clean and dry with honeycomb dressing on R anterior neck.wearing hard cervical collar  Decreased cervical rotation; tight trigger points in L upper traps, levators and scalenes as well as rhomboids.  - less tight today on exam and wearing Kiniseotape on L  shoulder down LUE.  Cardiovascular: RRR  Respiratory: CTA B/L no W/R/R  GI: Soft, NT, ND, hypoactive BS  Musculoskeletal:  Cervical back: Neck supple.  Comments: RUE- bicep 4/5, WE 4/5, triceps 4+/5, grip 4-/5, finger 4+/5 LUE- Biceps 2+/5, WE 2+/5, triceps 2+/5, grip 3/5, finger 3/5 RLE- HF 4-/5, KE 4/5, DF 4=/5, PF 4-/5 LLE- HF 2/5, KE 2/5, DF 2/5, PF 2-/5 Neurological:  Hoffman's LUE- strong; (-) Hoffman's in RUE no clonus in LEs B/L Sensation intact at C3 B/L; otherwise decreased at every dermatome to L3 B/L ; L4-S2 dermatomes B/L very decreased sensation- same S3-S5 B/L Psychiatric:  Very appropriate and pleasant    Assessment/Plan: 1. Functional deficits secondary to C3 ASIA C/D quadriplegia  which require 3+ hours per day of interdisciplinary therapy in a comprehensive inpatient rehab setting.  Physiatrist is providing close team supervision and 24 hour management of active medical problems listed below.  Physiatrist and rehab team continue to assess barriers to discharge/monitor patient progress toward functional and medical goals  Care Tool:  Bathing    Body parts bathed by patient: Chest, Abdomen, Front perineal area, Right upper leg, Left upper leg, Left arm   Body parts bathed by helper: Left lower leg, Right lower leg, Buttocks, Right arm     Bathing assist Assist Level: Maximal Assistance - Patient 24 - 49%     Upper Body Dressing/Undressing Upper body dressing   What is the patient wearing?: Pull over shirt, Orthosis    Upper body assist Assist Level: Maximal Assistance - Patient 25 - 49%    Lower Body Dressing/Undressing Lower body dressing      What is the patient wearing?: Underwear/pull up, Pants     Lower body assist Assist for lower body dressing: Total Assistance - Patient < 25%  Toileting Toileting    Toileting assist Assist for toileting: Total Assistance - Patient < 25%     Transfers Chair/bed transfer  Transfers assist      Chair/bed transfer assist level: Dependent - mechanical lift     Locomotion Ambulation   Ambulation assist   Ambulation activity did not occur: Safety/medical concerns  Assist level: 2 helpers Assistive device: Walker-platform Max distance: 20'   Walk 10 feet activity   Assist  Walk 10 feet activity did not occur: Safety/medical concerns  Assist level: 2 helpers Assistive device: Walker-rolling   Walk 50 feet activity   Assist Walk 50 feet with 2 turns activity did not occur: Safety/medical concerns         Walk 150 feet activity   Assist Walk 150 feet activity did not occur: Safety/medical concerns         Walk 10 feet on uneven surface  activity   Assist Walk 10 feet on uneven surfaces activity did not occur: Safety/medical concerns         Wheelchair     Assist Will patient use wheelchair at discharge?: Yes Type of Wheelchair: Manual Wheelchair activity did not occur: Safety/medical concerns         Wheelchair 50 feet with 2 turns activity    Assist    Wheelchair 50 feet with 2 turns activity did not occur: Safety/medical concerns       Wheelchair 150 feet activity     Assist  Wheelchair 150 feet activity did not occur: Safety/medical concerns       Blood pressure (!) 153/77, pulse (!) 54, temperature 97.6 F (36.4 C), temperature source Oral, resp. rate 18, height 5\' 10"  (1.778 m), weight 71.6 kg, SpO2 99 %.    Medical Problem List and Plan:  1. Bilateral upper and lower extremity weakness with paresthesias left greater than right with gait disturbance secondary to cervical myelopathy.S/P multilevel discectomy C3-4, 4-5, 5-6 and C6-7 with decompression of spinal cord and exiting nerve roots with arthrodesis 08/05/2019 per Dr. 08/07/2019. HARD Cervical collar when out of bed.  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  -patient may Shower if covers neck incision  -ELOS/Goals: Supervision  to Min assist - 3-3.5 weeks due to SCI  2. Antithrombotics:  -DVT/anticoagulation: SCDs. Discuss Lovenox for DVT prophylaxis with neurosurgery. Check vascular study   1/27- will start lovenox due to increased risk of DVT -antiplatelet therapy: Aspirin 81 mg to begin 08/12/2019  3. Pain Management: Post op pain, nerve pain and spasticity- Hydrocodone/Robaxin as needed  -will try and change to Baclofen for muscle spasms/spasticity once gets to CIR and possibly add Duloxetine for nerve pain   1/27- added baclofen 5 mg TID for spasticity and Duloxetine 30 mg QHS for nerve pain  1/28- add Lidoderm patches to L neck during day. 12 hrs on;12 hrs off  1/29- will increase Cymbalta on Monday. Lidoderm effective for L shoulder pain.  4. Mood: Provide emotional support  -antipsychotic agents: N/A  5. Neuropsych: This patient is capable of making decisions on his own behalf.  6. Skin/Wound Care: Routine skin checks  7. Fluids/Electrolytes/Nutrition: Routine in and outs with follow-up chemistries  8. Hypertension. Toprol-XL 25 mg daily, Lotensin 10 mg daily, Norvasc 5 mg daily. Monitor with increased mobility  9. Diabetes mellitus. Hemoglobin A1c 6.3. Glucophage 500 mg daily. Check blood sugars before meals and at bedtime   CBG (last 3)  Recent Labs    08/11/19 1629 08/11/19 2112 08/12/19  [id number]  GLUCAP 97 135* 92    1/29- BGs well controlled  10. Glaucoma/legally blind. Continue eyedrops as directed- continue pt's dark sunglasses for light sensitivity.  11. Hyperlipidemia. Lipitor  12. Constipation in setting of Neurogenic bowel. Adjust bowel program as needed.  1/28- LBM early AM 1/27 13. Neurogenic bladder- will check PVRS to make sure is emptying since needed a in/out cath due to lack of voiding 2 days ago.  1/27- ordered PVRs  1/29- added Flomax 0.4mg  qsupper since doesn't feel like emptying.   14/ Hypokalemia  1/27- K+ 3.4- will replete  1/28- labs in AM  1/29- K+ 3.8  LOS: 3 days A  FACE TO FACE EVALUATION WAS PERFORMED  Kelan Pritt 08/12/2019, 8:17 AM

## 2019-08-13 LAB — GLUCOSE, CAPILLARY
Glucose-Capillary: 93 mg/dL (ref 70–99)
Glucose-Capillary: 130 mg/dL — ABNORMAL HIGH (ref 70–99)
Glucose-Capillary: 111 mg/dL — ABNORMAL HIGH (ref 70–99)
Glucose-Capillary: 114 mg/dL — ABNORMAL HIGH (ref 70–99)

## 2019-08-13 MED ORDER — AMLODIPINE BESYLATE 10 MG PO TABS
10.0000 mg | ORAL_TABLET | Freq: Every day | ORAL | Status: DC
  Administered 2019-08-14 – 2019-08-26 (×13): 10 mg via ORAL
  Filled 2019-08-13 (×13): qty 1

## 2019-08-13 NOTE — Progress Notes (Signed)
[facility] PHYS[facility] MEDICINE & REHABILITATION PROGRESS NOTE   Subjective/Complaints:  Mr. [facility] has no complaints this morning. Says "I'm ok." Says pain is well controlled. Last BM was day before yesterday but he says he feels comfortable. Sleeping well at night.   ROS: patient denies SOB, CP, abd pain, vision changes (is blind) N/V/D/Constipation.   Objective:   No results found. No results for input(s): WBC, HGB, HCT, PLT in the last 72 hours. Recent Labs    08/12/19 0526  NA 140  K 3.8  CL 101  CO2 27  GLUCOSE 103*  BUN 16  CREATININE 0.77  CALCIUM 9.5    Intake/Output Summary (Last 24 hours) at 08/13/2019 1125 Last data filed at 08/13/2019 0935 Gross per 24 hour  Intake 780 ml  Output 1625 ml  Net -845 ml     Physical Exam: Vital Signs Blood pressure (!) 161/75, pulse 67, temperature 98.6 F (37 C), temperature source Oral, resp. rate 16, height 5\' 10"  (1.778 m), weight 71.6 kg, SpO2 100 %.  Constitutional: He appears well-developed and well-nourished. Lying in bed, appears comfortable. HENT:  Head: Normocephalic and atraumatic.  Eyes: wearing sunglasses   Neck:  ACDF incision site clean and dry with honeycomb dressing on R anterior neck. Decreased cervical rotation; tight trigger points in L upper traps, levators and scalenes as well as rhomboids. Cardiovascular: RRR  Respiratory: CTA B/L no W/R/R  GI: Soft, NT, ND, hypoactive BS  Musculoskeletal:  Cervical back: Neck supple.  Comments: RUE- bicep 4/5, WE 4/5, triceps 4+/5, grip 4-/5, finger 4+/5 LUE- Biceps 2+/5, WE 2+/5, triceps 3/5, grip 3/5, finger 3/5 RLE- HF 4-/5, KE 4/5, DF 4=/5, PF 4-/5 LLE- HF 2/5, KE 2/5, DF 2/5, PF 2-/5 Neurological:  Hoffman's LUE- strong; (-) Hoffman's in RUE no clonus in LEs B/L Sensation intact at C3 B/L; otherwise decreased at every dermatome to L3 B/L ; L4-S2 dermatomes B/L very decreased sensation- same S3-S5 B/L Psychiatric:  Very appropriate and pleasant     Assessment/Plan: 1. Functional deficits secondary to C3 ASIA C/D quadriplegia  which require 3+ hours per day of interdisciplinary therapy in a comprehensive inpatient rehab setting.  Physiatrist is providing close team supervision and 24 hour management of active medical problems listed below.  Physiatrist and rehab team continue to assess barriers to discharge/monitor patient progress toward functional and medical goals  Care Tool:  Bathing    Body parts bathed by patient: Chest, Abdomen, Front perineal area, Right upper leg, Left upper leg, Left arm   Body parts bathed by helper: Left lower leg, Right lower leg, Buttocks, Right arm     Bathing assist Assist Level: Maximal Assistance - Patient 24 - 49%     Upper Body Dressing/Undressing Upper body dressing   What is the patient wearing?: Pull over shirt    Upper body assist Assist Level: Total Assistance - Patient < 25%    Lower Body Dressing/Undressing Lower body dressing      What is the patient wearing?: Underwear/pull up     Lower body assist Assist for lower body dressing: Dependent - Patient 0%     Toileting Toileting    Toileting assist Assist for toileting: Dependent - Patient 0%     Transfers Chair/bed transfer  Transfers assist     Chair/bed transfer assist level: Maximal Assistance - Patient 25 - 49%     Locomotion Ambulation   Ambulation assist   Ambulation activity did not occur: Safety/medical concerns  Assist level: 2  helpers Assistive device: Walker-platform Max distance: 27'   Walk 10 feet activity   Assist  Walk 10 feet activity did not occur: Safety/medical concerns  Assist level: 2 helpers Assistive device: Walker-platform   Walk 50 feet activity   Assist Walk 50 feet with 2 turns activity did not occur: Safety/medical concerns         Walk 150 feet activity   Assist Walk 150 feet activity did not occur: Safety/medical concerns         Walk 10 feet on  uneven surface  activity   Assist Walk 10 feet on uneven surfaces activity did not occur: Safety/medical concerns         Wheelchair     Assist Will patient use wheelchair at discharge?: Yes Type of Wheelchair: Manual Wheelchair activity did not occur: Safety/medical concerns  Wheelchair assist level: Maximal Assistance - Patient 25 - 49% Max wheelchair distance: 5'    Wheelchair 50 feet with 2 turns activity    Assist    Wheelchair 50 feet with 2 turns activity did not occur: Safety/medical concerns       Wheelchair 150 feet activity     Assist  Wheelchair 150 feet activity did not occur: Safety/medical concerns       Blood pressure (!) 161/75, pulse 67, temperature 98.6 F (37 C), temperature source Oral, resp. rate 16, height 5\' 10"  (1.778 m), weight 71.6 kg, SpO2 100 %.    Medical Problem List and Plan:  1. Bilateral upper and lower extremity weakness with paresthesias left greater than right with gait disturbance secondary to cervical myelopathy.S/P multilevel discectomy C3-4, 4-5, 5-6 and C6-7 with decompression of spinal cord and exiting nerve roots with arthrodesis 08/05/2019 per Dr. 08/07/2019. HARD Cervical collar when out of bed.  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  -patient may Shower if covers neck incision  -ELOS/Goals: Supervision to Min assist - 3-3.5 weeks due to SCI  2. Antithrombotics:  -DVT/anticoagulation: SCDs. Discuss Lovenox for DVT prophylaxis with neurosurgery. Check vascular study   1/27- will start lovenox due to increased risk of DVT -antiplatelet therapy: Aspirin 81 mg to begin 08/12/2019  3. Pain Management: Post op pain, nerve pain and spasticity- Hydrocodone/Robaxin as needed  -will try and change to Baclofen for muscle spasms/spasticity once gets to CIR and possibly add Duloxetine for nerve pain   1/27- added baclofen 5 mg TID for spasticity and Duloxetine 30 mg QHS for nerve pain  1/28-  add Lidoderm patches to L neck during day. 12 hrs on;12 hrs off  1/29- will increase Cymbalta on Monday. Lidoderm effective for L shoulder pain.   1/30: pain is well controlled 4. Mood: Provide emotional support  -antipsychotic agents: N/A  5. Neuropsych: This patient is capable of making decisions on his own behalf.  6. Skin/Wound Care: Routine skin checks  7. Fluids/Electrolytes/Nutrition: Routine in and outs with follow-up chemistries  8. Hypertension. Toprol-XL 25 mg daily, Lotensin 10 mg daily, Norvasc 5 mg daily. Monitor with increased mobility   1/30: Uncontrolled on last several reads. Will increase Norvasc to 10mg  daily.  9. Diabetes mellitus. Hemoglobin A1c 6.3. Glucophage 500 mg daily. Check blood sugars before meals and at bedtime   CBG (last 3)  Recent Labs    08/12/19 1652 08/12/19 2104 08/13/19 0643  GLUCAP 85 132* 93    1/30- BGs well controlled  10. Glaucoma/legally blind. Continue eyedrops as directed- continue pt's dark sunglasses for light sensitivity.  11. Hyperlipidemia. Lipitor  12. Constipation in setting of Neurogenic bowel. Adjust bowel program as needed.  1/28- LBM early AM 1/27 13. Neurogenic bladder- will check PVRS to make sure is emptying since needed a in/out cath due to lack of voiding 2 days ago.  1/27- ordered PVRs  1/29- added Flomax 0.4mg  qsupper since doesn't feel like emptying.   14/ Hypokalemia  1/27- K+ 3.4- will replete  1/28- labs in AM  1/29- K+ 3.8  LOS: 4 days A FACE TO FACE EVALUATION WAS PERFORMED  Teagan Ozawa P Reggie Welge 08/13/2019, 11:25 AM

## 2019-08-14 ENCOUNTER — Inpatient Hospital Stay (HOSPITAL_COMMUNITY): Payer: Medicare Other

## 2019-08-14 LAB — GLUCOSE, CAPILLARY
Glucose-Capillary: 98 mg/dL (ref 70–99)
Glucose-Capillary: 99 mg/dL (ref 70–99)
Glucose-Capillary: 120 mg/dL — ABNORMAL HIGH (ref 70–99)
Glucose-Capillary: 106 mg/dL — ABNORMAL HIGH (ref 70–99)

## 2019-08-14 MED ORDER — DICLOFENAC SODIUM 1 % EX GEL
2.0000 g | Freq: Four times a day (QID) | CUTANEOUS | Status: DC | PRN
  Filled 2019-08-14: qty 100

## 2019-08-14 NOTE — Progress Notes (Signed)
[facility] PHYSICAL MEDICINE & REHABILITATION PROGRESS NOTE   Subjective/Complaints:  Complains of some L shoulder pain in hemiparetic left arm. Has lidocaine patch on.  Had BM this morning.  Sleeping well at night.  Working with therapy.   ROS: patient denies SOB, CP, abd pain, vision changes (is blind) N/V/D/Constipation.   Objective:   No results found. No results for input(s): WBC, HGB, HCT, PLT in the last 72 hours. Recent Labs    08/12/19 0526  NA 140  K 3.8  CL 101  CO2 27  GLUCOSE 103*  BUN 16  CREATININE 0.77  CALCIUM 9.5    Intake/Output Summary (Last 24 hours) at 08/14/2019 0949 Last data filed at 08/14/2019 0818 Gross per 24 hour  Intake 1180 ml  Output 1225 ml  Net -45 ml     Physical Exam: Vital Signs Blood pressure (!) 146/75, pulse 70, temperature 98.6 F (37 C), temperature source Oral, resp. rate 19, height 5\' 10"  (1.778 m), weight 71.6 kg, SpO2 100 %.  Constitutional: He appears well-developed and well-nourished. Sitting in [facility], working with therapy.  HENT:  Head: Normocephalic and atraumatic.  Eyes: wearing sunglasses   Neck:  ACDF incision site clean and dry with honeycomb dressing on R anterior neck. Decreased cervical rotation; tight trigger points in L upper traps, levators and scalenes as well as rhomboids. Cardiovascular: RRR  Respiratory: CTA B/L no W/R/R  GI: Soft, NT, ND, hypoactive BS  Musculoskeletal:  Cervical back: Neck supple.  Comments: RUE- bicep 4/5, WE 4/5, triceps 4+/5, grip 4-/5, finger 4+/5 LUE- Biceps 3/5, WE 2+/5, triceps 3/5, grip 3/5, finger 3/5. No tenderness over left shoulder joint. RLE- HF 4-/5, KE 4/5, DF 4=/5, PF 4-/5 LLE- HF 2/5, KE 2/5, DF 2/5, PF 2-/5 Neurological:  Hoffman's LUE- strong; (-) Hoffman's in RUE no clonus in LEs B/L Sensation intact at C3 B/L; otherwise decreased at every dermatome to L3 B/L ; L4-S2 dermatomes B/L very decreased sensation- same S3-S5 B/L Psychiatric:  Very appropriate and  pleasant    Assessment/Plan: 1. Functional deficits secondary to C3 ASIA C/D quadriplegia  which require 3+ hours per day of interdisciplinary therapy in a comprehensive inpatient rehab setting.  Physiatrist is providing close team supervision and 24 hour management of active medical problems listed below.  Physiatrist and rehab team continue to assess barriers to discharge/monitor patient progress toward functional and medical goals  Care Tool:  Bathing    Body parts bathed by patient: Chest, Abdomen, Front perineal area, Right upper leg, Left upper leg, Left arm   Body parts bathed by helper: Left lower leg, Right lower leg, Buttocks, Right arm     Bathing assist Assist Level: Maximal Assistance - Patient 24 - 49%     Upper Body Dressing/Undressing Upper body dressing   What is the patient wearing?: Pull over shirt    Upper body assist Assist Level: Total Assistance - Patient < 25%    Lower Body Dressing/Undressing Lower body dressing      What is the patient wearing?: Underwear/pull up     Lower body assist Assist for lower body dressing: Dependent - Patient 0%     Toileting Toileting    Toileting assist Assist for toileting: Dependent - Patient 0%     Transfers Chair/bed transfer  Transfers assist     Chair/bed transfer assist level: Maximal Assistance - Patient 25 - 49%     Locomotion Ambulation   Ambulation assist   Ambulation activity did not occur: Safety/medical  concerns  Assist level: 2 helpers Assistive device: Walker-platform Max distance: 27'   Walk 10 feet activity   Assist  Walk 10 feet activity did not occur: Safety/medical concerns  Assist level: 2 helpers Assistive device: Walker-platform   Walk 50 feet activity   Assist Walk 50 feet with 2 turns activity did not occur: Safety/medical concerns         Walk 150 feet activity   Assist Walk 150 feet activity did not occur: Safety/medical concerns         Walk  10 feet on uneven surface  activity   Assist Walk 10 feet on uneven surfaces activity did not occur: Safety/medical concerns         Wheelchair     Assist Will patient use wheelchair at discharge?: Yes Type of Wheelchair: Manual Wheelchair activity did not occur: Safety/medical concerns  Wheelchair assist level: Maximal Assistance - Patient 25 - 49% Max wheelchair distance: 5'    Wheelchair 50 feet with 2 turns activity    Assist    Wheelchair 50 feet with 2 turns activity did not occur: Safety/medical concerns       Wheelchair 150 feet activity     Assist  Wheelchair 150 feet activity did not occur: Safety/medical concerns       Blood pressure (!) 146/75, pulse 70, temperature 98.6 F (37 C), temperature source Oral, resp. rate 19, height 5\' 10"  (1.778 m), weight 71.6 kg, SpO2 100 %.    Medical Problem List and Plan:  1. Bilateral upper and lower extremity weakness with paresthesias left greater than right with gait disturbance secondary to cervical myelopathy.S/P multilevel discectomy C3-4, 4-5, 5-6 and C6-7 with decompression of spinal cord and exiting nerve roots with arthrodesis 08/05/2019 per Dr. 08/07/2019. HARD Cervical collar when out of bed.  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  -patient may Shower if covers neck incision  -ELOS/Goals: Supervision to Min assist - 3-3.5 weeks due to SCI  2. Antithrombotics:  -DVT/anticoagulation: SCDs. Discuss Lovenox for DVT prophylaxis with neurosurgery. Check vascular study   1/27- will start lovenox due to increased risk of DVT -antiplatelet therapy: Aspirin 81 mg to begin 08/12/2019  3. Pain Management: Post op pain, nerve pain and spasticity- Hydrocodone/Robaxin as needed  -will try and change to Baclofen for muscle spasms/spasticity once gets to CIR and possibly add Duloxetine for nerve pain   1/27- added baclofen 5 mg TID for spasticity and Duloxetine 30 mg QHS for nerve  pain  1/28- add Lidoderm patches to L neck during day. 12 hrs on;12 hrs off  1/29- will increase Cymbalta on Monday. Lidoderm effective for L shoulder pain.   1/30: pain is well controlled  1/31: Complains of some pain over left shoulder. Lidocaine patch on. Not tender to palpation. Likely secondary to scapular destablization given left upper extremity hemiparesis. Ordered diclofenac gel 4 times daily prn for pain.  4. Mood: Provide emotional support  -antipsychotic agents: N/A  5. Neuropsych: This patient is capable of making decisions on his own behalf.  6. Skin/Wound Care: Routine skin checks  7. Fluids/Electrolytes/Nutrition: Routine in and outs with follow-up chemistries  8. Hypertension. Toprol-XL 25 mg daily, Lotensin 10 mg daily, Norvasc 5 mg daily. Monitor with increased mobility   1/30: Uncontrolled on last several reads. Will increase Norvasc to 10mg  daily.   1/31: Reviewed flowsheet. Elevated this morning. Within normal limits in afternoon and evening. Continue to monitor.  9. Diabetes mellitus. Hemoglobin A1c 6.3. Glucophage  500 mg daily. Check blood sugars before meals and at bedtime   CBG (last 3)  Recent Labs    08/13/19 1655 08/13/19 2059 08/14/19 0559  GLUCAP 111* 114* 98    1/31- BGs well controlled  10. Glaucoma/legally blind. Continue eyedrops as directed- continue pt's dark sunglasses for light sensitivity.  11. Hyperlipidemia. Lipitor  12. Constipation in setting of Neurogenic bowel. Adjust bowel program as needed.  1/28- LBM early AM 1/27 13. Neurogenic bladder- will check PVRS to make sure is emptying since needed a in/out cath due to lack of voiding 2 days ago.  1/27- ordered PVRs  1/29- added Flomax 0.4mg  qsupper since doesn't feel like emptying.   14/ Hypokalemia  1/27- K+ 3.4- will replete  1/28- labs in AM  1/29- K+ 3.8  LOS: 5 days A FACE TO FACE EVALUATION WAS PERFORMED  Drema Pry Llewellyn Choplin 08/14/2019, 9:49 AM

## 2019-08-14 NOTE — Progress Notes (Addendum)
Physical Therapy Session Note  Patient Details  Name: Brian Daniels MRN: [id number] Date of Birth: 1955/11/01  Today's Date: 08/14/2019 PT Individual Time: 0757-0900 PT Individual Time Calculation (min): 63 min   Short Term Goals: Week 1:  PT Short Term Goal 1 (Week 1): Pt will complete bed mobility with assist x 1 consistently PT Short Term Goal 2 (Week 1): Pt will perform least restrictive transfer with assist x 1 consistently PT Short Term Goal 3 (Week 1): Pt will initiate gait training as safe and able Week 2:    Week 3:     Skilled Therapeutic Interventions/Progress Updates:     PAIN  LUE/shoulder, nursing provided meds and lidocaine patch at beginning of session.  Treatment to tolerance.  Pt initially supine and agreeable to treatment session with focus on functional sitting balance, functional mobility, dressing, transfers.  Pt initially receiving meds and completing breakfast.  Cervical collar donned by therapist. Pt able to wt shift ant w/min assist for nurse to apply lidocaide patch.  Total assist for donning shirt. Pt is able to ant wt shift w/min assist for therapist to pull shirt down over torso.   Pt able to lift feet for therapist to thread pants, therapist to donn socks, and shoes.  Pt is able to press feet down to seat heels into shoes.  Pt also able to bridge partially for therapist to raise pants. Supine to side to sit w/mod assist hob at 30. Static sit w/cga using rail.  Worked on maintaining balance while alternately scooting hips.  STS from edge of bed w/max assist due to post lean, inadequate ant wt shift w/transition.  Worked on achieving full upright stand and wt shifting in standing.  SPT to wc w/max assist for balance, cues.   Pt transported to rehab gym for continued session. STS from wc w/max assist and cues for ant wt shift to PFRW.  Gait 56ft w/mod assist and cues to increase base of support due to tendency to scissor w/resulatant increased R lean, wc closely  following. Worked on STS, ant wt shifting w/transition, wt shifting in standing to achieve midline, holding midline once achieved.  Performed 2-3 reps w/PFRW then switched to hi lo table for improved UE wbing thru hands.  Pt able to utilize multimodal feedback cues to improve overall midline thru repitition.   On last effort, pt c/o "things going dark", returned to sitting, reclined wc and elevated legs and sx resolved quickly.    Pt transported to room at end of session. Pt left oob in wc w/alarm belt set and needs in reach, no further symptoms/complaints.    Therapy Documentation Precautions:  Precautions Precautions: Fall Precaution Comments: pt legally blind in both eyes Required Braces or Orthoses: Cervical Brace Cervical Brace: Hard collar Restrictions Weight Bearing Restrictions: No    Therapy/Group: Individual Therapy  Rada Hay, PT   Shearon Balo 08/14/2019, 12:57 PM

## 2019-08-14 NOTE — Progress Notes (Signed)
Occupational Therapy Session Note  Patient Details  Name: Brian Daniels MRN: [id number] Date of Birth: 03-10-1956  Today's Date: 08/14/2019 OT Individual Time: 1100-1201 OT Individual Time Calculation (min): 61 min    Short Term Goals: Week 1:  OT Short Term Goal 1 (Week 1): Pt will be able to maintain static sitting with S to increase safety to be able to sit on a tub bench. OT Short Term Goal 2 (Week 1): Pt will be able to don a shirt with min A OT Short Term Goal 3 (Week 1): Pt will be able to rise to stand with max A of 1. OT Short Term Goal 4 (Week 1): Pt will be able to transfer to [facility] with max A of 1.  Skilled Therapeutic Interventions/Progress Updates:    1;1. Pt received in w/c agreeable to bathing/dressing this session. HOH A provided to LUE for reaching, ringing out wash cloth and incorporation of UE into bathing tasks. Pt requires total A to doff shirt with A to facilitate washing RUE with LUE for NMR. Pt able to thread LUE into shirt and pull down front but requires A to pull overhead and thread RUE. Pt grooms at sink with A to apply toothpaste onto toothbrush with LUE and pt completes oral care with RUE. Pt reporting wanting to attempt to have BM and stedy utilized with MOD A d/t retropulsion with VC for anterior weight shift in standing/terminal hip extension to transfer onto BSc over toilet and eventually bed. Pt unsuccessful with void and OT washes buttock in standing and pt able to wash peri area standing with MOD A in stedy. Exited session with pt seated in bed, exit alarm on and call light in reach  Therapy Documentation Precautions:  Precautions Precautions: Fall Precaution Comments: pt legally blind in both eyes Required Braces or Orthoses: Cervical Brace Cervical Brace: Hard collar Restrictions Weight Bearing Restrictions: No General:   Vital Signs:  Pain:   ADL: ADL Eating: Maximal assistance Where Assessed-Eating: Bed level Grooming: Maximal  assistance Where Assessed-Grooming: Bed level Upper Body Bathing: Moderate assistance Where Assessed-Upper Body Bathing: Bed level Lower Body Bathing: Maximal assistance Where Assessed-Lower Body Bathing: Bed level Upper Body Dressing: Maximal assistance Where Assessed-Upper Body Dressing: Bed level Lower Body Dressing: Dependent Where Assessed-Lower Body Dressing: Bed level Toileting: Not assessed Toilet Transfer Method: Not assessed Vision   Perception    Praxis   Exercises:   Other Treatments:     Therapy/Group: Individual Therapy  Shon Hale 08/14/2019, 12:07 PM

## 2019-08-14 NOTE — Progress Notes (Signed)
Physical Therapy Session Note  Patient Details  Name: Brian Daniels MRN: [id number] Date of Birth: 1955-11-11  Today's Date: 08/14/2019 PT Individual Time: 7169-6789 PT Individual Time Calculation (min): 58 min    Short Term Goals: Week 1:  PT Short Term Goal 1 (Week 1): Pt will complete bed mobility with assist x 1 consistently PT Short Term Goal 2 (Week 1): Pt will perform least restrictive transfer with assist x 1 consistently PT Short Term Goal 3 (Week 1): Pt will initiate gait training as safe and able  Skilled Therapeutic Interventions/Progress Updates:    Pt supine in bed upon PT arrival, agreeable to therapy tx and denies pain. Pt transferred to sitting EOB with min assist, cues for techniques and donned hard cervical collar in sitting. Pt performed squat pivot to the w/c with min assist, cues for techniques and transported to the gym. Pt performed sit<>stands from w/c with L PFRW and mod assist, cues for hand placement and techniques. In standing pt worked on balance and LE strength to perform x 20 marches in place with L PFRW, min-mod assist. Pt ambulated forwards/backwards this session 2 x 10 ft in each direction with mod assist and L PFRW, cues for foot placement and occasional assist to steer walker with backwards ambulation. Pt ambulated to the mat x 12 ft with PFRW and mod assist, cues for upright posture and hip/knee extension with fatigue, cues for foot placement and for sequencing. From elevated mat pt worked on sit<>stands without using UE s to push up from mat, instead reaching out in front with UE s on therapist's hands to facilitation increased anterior weightshift, x 8 with mod assist. Upon standing worked on static standing balance with mod assist cues for increased hip/knee extension and midline, pt noted to have difficulty fully extending hips - possibly from hip extensor weakness or hip flexor tightness - need to further assist. Pt performed stand pivot back to w/c with PFRW  and mod assist, transported back to room and left in w/c with needs in reach and chair alarm set.   Therapy Documentation Precautions:  Precautions Precautions: Fall Precaution Comments: pt legally blind in both eyes Required Braces or Orthoses: Cervical Brace Cervical Brace: Hard collar Restrictions Weight Bearing Restrictions: No    Therapy/Group: Individual Therapy  Cresenciano Genre, PT, DPT, CSRS 08/14/2019, 4:58 PM

## 2019-08-15 ENCOUNTER — Inpatient Hospital Stay (HOSPITAL_COMMUNITY): Payer: Medicare Other

## 2019-08-15 ENCOUNTER — Inpatient Hospital Stay (HOSPITAL_COMMUNITY): Payer: Medicare Other | Admitting: Occupational Therapy

## 2019-08-15 ENCOUNTER — Inpatient Hospital Stay (HOSPITAL_COMMUNITY): Payer: Medicare Other | Admitting: Physical Therapy

## 2019-08-15 LAB — GLUCOSE, CAPILLARY
Glucose-Capillary: 101 mg/dL — ABNORMAL HIGH (ref 70–99)
Glucose-Capillary: 118 mg/dL — ABNORMAL HIGH (ref 70–99)
Glucose-Capillary: 99 mg/dL (ref 70–99)
Glucose-Capillary: 129 mg/dL — ABNORMAL HIGH (ref 70–99)

## 2019-08-15 MED ORDER — DULOXETINE HCL 30 MG PO CPEP
60.0000 mg | ORAL_CAPSULE | Freq: Every day | ORAL | Status: DC
  Administered 2019-08-15 – 2019-08-25 (×12): 60 mg via ORAL
  Filled 2019-08-15 (×11): qty 2

## 2019-08-15 NOTE — Progress Notes (Signed)
Physical Therapy Session Note  Patient Details  Name: Brian Daniels MRN: [id number] Date of Birth: Jul 02, 1956  Today's Date: 08/15/2019 PT Individual Time: 1400-1505 PT Individual Time Calculation (min): 65 min   Short Term Goals: Week 1:  PT Short Term Goal 1 (Week 1): Pt will complete bed mobility with assist x 1 consistently PT Short Term Goal 2 (Week 1): Pt will perform least restrictive transfer with assist x 1 consistently PT Short Term Goal 3 (Week 1): Pt will initiate gait training as safe and able  Skilled Therapeutic Interventions/Progress Updates:    Supine to sit with CGA to come to EOB with verbal cues for scooting to EOB to get feet on floor, using bed rail for support. PT donned cervical collar and shoes (total assist). Performed mod assist squat pivot to L to w/c with verbal and tactile cues for hand placement and technique/foot placement. NMR for blocked practice scooting to aid with overall transfer technique with focus on increasing anterior weightshift and clearing bottom with equal push through LE (strong tendency for more use of RLE due to LLE weakness) going to R and to the L. Initially noted decreased clearance going to the R but improved with repetition. Blocked practice sit <> stands for focus on transitional movement, eccentric control, and functional strengthening with and without AD for NMR with focus on facilitation of transitional movement pattern and increased weightbearing to the LLE. Cues for hip and trunk extension which pt able to activate when cued. Balance, coordination and strength training with toe taps x 2 sets each x 10 reps with R and L. Seated rest break between sets due to muscle fatigue. Transfer back to w/c via stand pivot without AD with min/mod assist for weightshifting. Pt reports need to use bathroom. Returned to room via w/c total assist and performed stand pivot with grab bar to raised toilet/BSC with min to mod assist and min assist for balance for  assist with clothing management. Unsuccessful with BM but able to void with urinal in standing position (total assist for placement) and min assist for balance using grab bar for support. Sister with questions about conference and educated on how the meeting runs and knowing a targeted discharge date tomorrow.   Therapy Documentation Precautions:  Precautions Precautions: Fall Precaution Comments: pt legally blind in both eyes Required Braces or Orthoses: Cervical Brace Cervical Brace: Hard collar Restrictions Weight Bearing Restrictions: No Pain:  Denies pain.     Therapy/Group: Individual Therapy  [name] [name], PT, DPT, CBIS  08/15/2019, 3:13 PM

## 2019-08-15 NOTE — Progress Notes (Signed)
Occupational Therapy Session Note  Patient Details  Name: Brian Daniels MRN: [id number] Date of Birth: Nov 26, 1955  Today's Date: 08/15/2019 OT Individual Time: 0900-0930 OT Individual Time Calculation (min): 30 min    Short Term Goals: Week 1:  OT Short Term Goal 1 (Week 1): Pt will be able to maintain static sitting with S to increase safety to be able to sit on a tub bench. OT Short Term Goal 2 (Week 1): Pt will be able to don a shirt with min A OT Short Term Goal 3 (Week 1): Pt will be able to rise to stand with max A of 1. OT Short Term Goal 4 (Week 1): Pt will be able to transfer to [facility] with max A of 1.  Skilled Therapeutic Interventions/Progress Updates:    Treatment session with focus on LUE ROM and strengthening. Pt received upright in w/c agreeable to therapy session.  Utilized UE Ranger in sitting with focus on shoulder flexion and elbow extension.  Therapist provided manual facilitation faded to support to increase elbow extension during range.  Completed horizontal abduction/adduction with UE Ranger with pt demonstrating increased elbow extension in this range.  Provided pt with yellow foam block to allow pt to continue to focus on grasp and pinch strength. Pt able to complete thumb opposition with foam block - may be able to advance to next resistance in 1-2 days.  Therapy Documentation Precautions:  Precautions Precautions: Fall Precaution Comments: pt legally blind in both eyes Required Braces or Orthoses: Cervical Brace Cervical Brace: Hard collar Restrictions Weight Bearing Restrictions: No Pain:  Pt with no c/o pain   Therapy/Group: Individual Therapy  Rosalio Loud 08/15/2019, 12:18 PM

## 2019-08-15 NOTE — Progress Notes (Signed)
Occupational Therapy Session Note  Patient Details  Name: Brian Daniels MRN: [id number] Date of Birth: 30-Dec-1955  Today's Date: 08/15/2019 OT Individual Time: 0700-0755 OT Individual Time Calculation (min): 55 min    Short Term Goals: Week 1:  OT Short Term Goal 1 (Week 1): Pt will be able to maintain static sitting with S to increase safety to be able to sit on a tub bench. OT Short Term Goal 2 (Week 1): Pt will be able to don a shirt with min A OT Short Term Goal 3 (Week 1): Pt will be able to rise to stand with max A of 1. OT Short Term Goal 4 (Week 1): Pt will be able to transfer to [facility] with max A of 1.  Skilled Therapeutic Interventions/Progress Updates:    Pt resting in bed upon arrival and agreeable to getting OOB to participate in bathing/dressing.  Supine>sit EOB with min A.  Sit<>stand min A with Stedy. Pt completed bathing/dressing with sit<>stand from w/c with Stedy. Pt with limited functional use of LUE 2/2 LUE/shoulder pain. Pt requires max a for dressing tasks with sit<>stand with Stedy. Pt maintains standing balance with CGA in Stedy with BUE support.  Pt unable to bathe buttocks or pull pants over hips while standing in [place].  Pt unable to use LUE functionally or provide support when standing. Pt remained in w/c with all needs within reach and belt alarm activated.   Therapy Documentation Precautions:  Precautions Precautions: Fall Precaution Comments: pt legally blind in both eyes Required Braces or Orthoses: Cervical Brace Cervical Brace: Hard collar Restrictions Weight Bearing Restrictions: No  Pain: Pt c/o LUE and shoulder pain (unrated); relief with increased movement  Therapy/Group: Individual Therapy  Rich Brave 08/15/2019, 8:02 AM

## 2019-08-15 NOTE — Progress Notes (Signed)
Physical Therapy Session Note  Patient Details  Name: Brian Daniels MRN: [id number] Date of Birth: Oct 22, 1955  Today's Date: 08/15/2019 PT Individual Time: 1000-1100 PT Individual Time Calculation (min): 60 min   Short Term Goals: Week 1:  PT Short Term Goal 1 (Week 1): Pt will complete bed mobility with assist x 1 consistently PT Short Term Goal 2 (Week 1): Pt will perform least restrictive transfer with assist x 1 consistently PT Short Term Goal 3 (Week 1): Pt will initiate gait training as safe and able  Skilled Therapeutic Interventions/Progress Updates:    Pt received seated in w/c in room, agreeable to PT session. No complaints of pain this AM. Sit to stand with mod A to L PFRW, cues for safe sequencing and setup of transfer. Ambulation 2 x 33 ft with L PFRW and mod A for balance and steering of RW, improved B step length. Pt exhibits ongoing flexed hips and knees during gait as well as narrow BOS and scissoring, L>R. With mod cueing pt able to widen BOS but limited by decreased proprioception in BLE as well as visual deficits. Squat pivot transfer to the R and to the L w/c to/from mat table with min A onto mat table and mod A back into w/c, mod cueing for sequencing and safe setup of transfer. Pt requests to use the bathroom at end of session. Stand pivot transfer w/c to elevated BSC over toilet with use of grab bar and mod A, max cueing for safety. Pt unable to void on toilet. Stand pivot transfer back to w/c with mod A. Squat pivot transfer back to bed with mod A. Sit to supine mod A for BLE management. Pt left semi-reclined in bed with needs in reach at end of session.  Therapy Documentation Precautions:  Precautions Precautions: Fall Precaution Comments: pt legally blind in both eyes Required Braces or Orthoses: Cervical Brace Cervical Brace: Hard collar Restrictions Weight Bearing Restrictions: No    Therapy/Group: Individual Therapy   Peter Congo, PT, DPT  08/15/2019,  12:49 PM

## 2019-08-15 NOTE — Progress Notes (Signed)
[facility] PHYSICAL MEDICINE & REHABILITATION PROGRESS NOTE   Subjective/Complaints:  LBM Saturday; eating and sleeping OK. Denies issues.  Didn't get labs this AM- will order for tomorrow.  Had surgery 1/22, so ready to remove honeycomb dressing. Has multiple meds for bowel prn- will ask nursing to give.   ROS: patient denies SOB, CP, abd pain, vision changes (is blind) N/V/D/Constipation.   Objective:   No results found. No results for input(s): WBC, HGB, HCT, PLT in the last 72 hours. No results for input(s): NA, K, CL, CO2, GLUCOSE, BUN, CREATININE, CALCIUM in the last 72 hours.  Intake/Output Summary (Last 24 hours) at 08/15/2019 0839 Last data filed at 08/15/2019 0708 Gross per 24 hour  Intake 900 ml  Output 1925 ml  Net -1025 ml     Physical Exam: Vital Signs Blood pressure (!) 144/70, pulse 77, temperature 98.5 F (36.9 C), temperature source Oral, resp. rate 16, height 5\' 10"  (1.778 m), weight 71.6 kg, SpO2 100 %.  Constitutional: He appears well-developed and well-nourished. Sitting in WC, working with OT, smiling intermittently, NAD  HENT:  Head: Normocephalic and atraumatic.  Eyes: wearing sunglasses   Neck:  ACDF incision site clean and dry with honeycomb dressing on R anterior neck. Decreased cervical rotation; tight trigger points in L upper traps, levators and scalenes as well as rhomboids- more on L side.  Cardiovascular: RRR  Respiratory: CTA B/L no W/R/R  GI: Soft, NT, ND, hypoactive BS  Musculoskeletal:  Cervical back: Neck supple.  Comments: RUE- bicep 4/5, WE 4/5, triceps 4+/5, grip 4-/5, finger 4+/5 LUE- Biceps 3/5, WE 2+/5, triceps 3/5, grip 3/5, finger 3/5. No tenderness over left shoulder joint. RLE- HF 4-/5, KE 4/5, DF 4=/5, PF 4-/5 LLE- HF 2/5, KE 2/5, DF 2/5, PF 2-/5 Neurological:  Hoffman's LUE- strong; (-) Hoffman's in RUE;  no clonus in LEs B/L Sensation intact at C3 B/L; otherwise decreased at every dermatome to L3 B/L ; L4-S2 dermatomes  B/L very decreased sensation- same S3-S5 B/L Psychiatric:  Very appropriate and pleasant    Assessment/Plan: 1. Functional deficits secondary to C3 ASIA C/D quadriplegia  which require 3+ hours per day of interdisciplinary therapy in a comprehensive inpatient rehab setting.  Physiatrist is providing close team supervision and 24 hour management of active medical problems listed below.  Physiatrist and rehab team continue to assess barriers to discharge/monitor patient progress toward functional and medical goals  Care Tool:  Bathing    Body parts bathed by patient: Left arm, Chest, Abdomen, Right upper leg, Left upper leg, Face, Front perineal area   Body parts bathed by helper: Right arm, Buttocks, Right lower leg, Left lower leg     Bathing assist Assist Level: Moderate Assistance - Patient 50 - 74%     Upper Body Dressing/Undressing Upper body dressing   What is the patient wearing?: Pull over shirt    Upper body assist Assist Level: Maximal Assistance - Patient 25 - 49%    Lower Body Dressing/Undressing Lower body dressing      What is the patient wearing?: Underwear/pull up, Pants     Lower body assist Assist for lower body dressing: Maximal Assistance - Patient 25 - 49%     Toileting Toileting    Toileting assist Assist for toileting: Dependent - Patient 0%     Transfers Chair/bed transfer  Transfers assist     Chair/bed transfer assist level: Moderate Assistance - Patient 50 - 74%     Locomotion Ambulation  Ambulation assist   Ambulation activity did not occur: Safety/medical concerns  Assist level: Moderate Assistance - Patient 50 - 74% Assistive device: Walker-platform Max distance: 12 ft   Walk 10 feet activity   Assist  Walk 10 feet activity did not occur: Safety/medical concerns  Assist level: Moderate Assistance - Patient - 50 - 74% Assistive device: Walker-platform   Walk 50 feet activity   Assist Walk 50 feet with 2 turns  activity did not occur: Safety/medical concerns         Walk 150 feet activity   Assist Walk 150 feet activity did not occur: Safety/medical concerns         Walk 10 feet on uneven surface  activity   Assist Walk 10 feet on uneven surfaces activity did not occur: Safety/medical concerns         Wheelchair     Assist Will patient use wheelchair at discharge?: Yes Type of Wheelchair: Manual Wheelchair activity did not occur: Safety/medical concerns  Wheelchair assist level: Maximal Assistance - Patient 25 - 49% Max wheelchair distance: 5'    Wheelchair 50 feet with 2 turns activity    Assist    Wheelchair 50 feet with 2 turns activity did not occur: Safety/medical concerns       Wheelchair 150 feet activity     Assist  Wheelchair 150 feet activity did not occur: Safety/medical concerns       Blood pressure (!) 144/70, pulse 77, temperature 98.5 F (36.9 C), temperature source Oral, resp. rate 16, height 5\' 10"  (1.778 m), weight 71.6 kg, SpO2 100 %.    Medical Problem List and Plan:  1. Bilateral upper and lower extremity weakness with paresthesias left greater than right with gait disturbance secondary to cervical myelopathy.S/P multilevel discectomy C3-4, 4-5, 5-6 and C6-7 with decompression of spinal cord and exiting nerve roots with arthrodesis 08/05/2019 per Dr. 08/07/2019. HARD Cervical collar when out of bed.  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  -patient may Shower if covers neck incision  -ELOS/Goals: Supervision to Min assist - 3-3.5 weeks due to SCI   2/1- will remove honeycomb dressing and place daily dry dressing 2. Antithrombotics:  -DVT/anticoagulation: SCDs. Discuss Lovenox for DVT prophylaxis with neurosurgery. Check vascular study   1/27- will start lovenox due to increased risk of DVT -antiplatelet therapy: Aspirin 81 mg to begin 08/12/2019  3. Pain Management: Post op pain, nerve pain and  spasticity- Hydrocodone/Robaxin as needed  -will try and change to Baclofen for muscle spasms/spasticity once gets to CIR and possibly add Duloxetine for nerve pain   1/27- added baclofen 5 mg TID for spasticity and Duloxetine 30 mg QHS for nerve pain  1/28- add Lidoderm patches to L neck during day. 12 hrs on;12 hrs off  1/29- will increase Cymbalta on Monday. Lidoderm effective for L shoulder pain.   1/30: pain is well controlled  1/31: Complains of some pain over left shoulder. Lidocaine patch on. Not tender to palpation. Likely secondary to scapular destablization given left upper extremity hemiparesis. Ordered diclofenac gel 4 times daily prn for pain.   2/1- will increase Cymbalta to 60 mg daily.  4. Mood: Provide emotional support  -antipsychotic agents: N/A  5. Neuropsych: This patient is capable of making decisions on his own behalf.  6. Skin/Wound Care: Routine skin checks  7. Fluids/Electrolytes/Nutrition: Routine in and outs with follow-up chemistries  8. Hypertension. Toprol-XL 25 mg daily, Lotensin 10 mg daily, Norvasc 5 mg daily. Monitor  with increased mobility   1/30: Uncontrolled on last several reads. Will increase Norvasc to 10mg  daily.   1/31: Reviewed flowsheet. Elevated this morning. Within normal limits in afternoon and evening. Continue to monitor.  9. Diabetes mellitus. Hemoglobin A1c 6.3. Glucophage 500 mg daily. Check blood sugars before meals and at bedtime   CBG (last 3)  Recent Labs    08/14/19 1638 08/14/19 2113 08/15/19 0627  GLUCAP 120* 106* 101*    2/1-- BGs well controlled - 101-120 10. Glaucoma/legally blind. Continue eyedrops as directed- continue pt's dark sunglasses for light sensitivity.  11. Hyperlipidemia. Lipitor  12. Constipation in setting of Neurogenic bowel. Adjust bowel program as needed.  1/28- LBM early AM 1/27 13. Neurogenic bladder- will check PVRS to make sure is emptying since needed a in/out cath due to lack of voiding 2 days  ago.  1/27- ordered PVRs  1/29- added Flomax 0.4mg  qsupper since doesn't feel like emptying.   14/ Hypokalemia  1/27- K+ 3.4- will replete  1/28- labs in AM  1/29- K+ 3.8  2/1- check labs in AM  LOS: 6 days A FACE TO FACE EVALUATION WAS PERFORMED  Brian Daniels 08/15/2019, 8:39 AM

## 2019-08-16 ENCOUNTER — Inpatient Hospital Stay (HOSPITAL_COMMUNITY): Payer: Medicare Other | Admitting: Physical Therapy

## 2019-08-16 ENCOUNTER — Inpatient Hospital Stay (HOSPITAL_COMMUNITY): Payer: Medicare Other

## 2019-08-16 LAB — COMPREHENSIVE METABOLIC PANEL
ALT: 35 U/L (ref 0–44)
AST: 20 U/L (ref 15–41)
Albumin: 3.1 g/dL — ABNORMAL LOW (ref 3.5–5.0)
Alkaline Phosphatase: 75 U/L (ref 38–126)
Anion gap: 10 (ref 5–15)
BUN: 13 mg/dL (ref 8–23)
CO2: 26 mmol/L (ref 22–32)
Calcium: 9.2 mg/dL (ref 8.9–10.3)
Chloride: 102 mmol/L (ref 98–111)
Creatinine, Ser: 0.72 mg/dL (ref 0.61–1.24)
GFR calc Af Amer: >60 mL/min (ref >60)
GFR calc non Af Amer: >60 mL/min (ref >60)
Glucose, Bld: 91 mg/dL (ref 70–99)
Potassium: 3.7 mmol/L (ref 3.5–5.1)
Sodium: 138 mmol/L (ref 135–145)
Total Bilirubin: 0.4 mg/dL (ref 0.3–1.2)
Total Protein: 7 g/dL (ref 6.5–8.1)

## 2019-08-16 LAB — GLUCOSE, CAPILLARY
Glucose-Capillary: 101 mg/dL — ABNORMAL HIGH (ref 70–99)
Glucose-Capillary: 128 mg/dL — ABNORMAL HIGH (ref 70–99)
Glucose-Capillary: 70 mg/dL (ref 70–99)
Glucose-Capillary: 225 mg/dL — ABNORMAL HIGH (ref 70–99)

## 2019-08-16 LAB — CBC WITH DIFFERENTIAL/PLATELET
Abs Immature Granulocytes: 0.02 10*3/uL (ref 0.00–0.07)
Basophils Absolute: 0 10*3/uL (ref 0.0–0.1)
Basophils Relative: 1 %
Eosinophils Absolute: 0.2 10*3/uL (ref 0.0–0.5)
Eosinophils Relative: 3 %
HCT: 35.3 % — ABNORMAL LOW (ref 39.0–52.0)
Hemoglobin: 11.4 g/dL — ABNORMAL LOW (ref 13.0–17.0)
Immature Granulocytes: 0 %
Lymphocytes Relative: 24 %
Lymphs Abs: 1.7 10*3/uL (ref 0.7–4.0)
MCH: 27 pg (ref 26.0–34.0)
MCHC: 32.3 g/dL (ref 30.0–36.0)
MCV: 83.5 fL (ref 80.0–100.0)
Monocytes Absolute: 0.7 10*3/uL (ref 0.1–1.0)
Monocytes Relative: 10 %
Neutro Abs: 4.4 10*3/uL (ref 1.7–7.7)
Neutrophils Relative %: 62 %
Platelets: 256 10*3/uL (ref 150–400)
RBC: 4.23 MIL/uL (ref 4.22–5.81)
RDW: 13.2 % (ref 11.5–15.5)
WBC: 7 10*3/uL (ref 4.0–10.5)
nRBC: 0 % (ref 0.0–0.2)

## 2019-08-16 MED ORDER — SENNA 8.6 MG PO TABS
2.0000 | ORAL_TABLET | Freq: Two times a day (BID) | ORAL | Status: DC
  Administered 2019-08-16 – 2019-08-26 (×19): 17.2 mg via ORAL
  Filled 2019-08-16 (×21): qty 2

## 2019-08-16 MED ORDER — MAGNESIUM CITRATE PO SOLN
1.0000 | Freq: Once | ORAL | Status: DC
  Filled 2019-08-16: qty 296

## 2019-08-16 NOTE — Progress Notes (Signed)
Occupational Therapy Weekly Progress Note  Patient Details  Name: Brian Daniels MRN: [id number] Date of Birth: 10-May-1956  Beginning of progress report period: August 10, 2019 End of progress report period: August 16, 2019  Patient has met 3 of 4 short term goals. Pt is making steady progress with BADLs and functional transfers since admission.  Pt performs squat pivot transfers with mod A. Sit<>stand with RW and PFRW with mod A.  Sit<>stand with Stedy with min A. Pt requires mod A for UB dressing and max A for LB dressing tasks.  Pt completes bathing with mod A. Pt's functional use of LUE continues to improve and pt initiates use of LUE during functional tasks.   Patient continues to demonstrate the following deficits: muscle weakness, decreased cardiorespiratoy endurance, impaired timing and sequencing, unbalanced muscle activation and decreased coordination, decreased visual acuity and decreased sitting balance, decreased standing balance, decreased postural control and decreased balance strategies and therefore will continue to benefit from skilled OT intervention to enhance overall performance with BADL and iADL.  Patient progressing toward long term goals..  Continue plan of care.  OT Short Term Goals Week 1:  OT Short Term Goal 1 (Week 1): Pt will be able to maintain static sitting with S to increase safety to be able to sit on a tub bench. OT Short Term Goal 1 - Progress (Week 1): Met OT Short Term Goal 2 (Week 1): Pt will be able to don a shirt with min A OT Short Term Goal 2 - Progress (Week 1): Progressing toward goal OT Short Term Goal 3 (Week 1): Pt will be able to rise to stand with max A of 1. OT Short Term Goal 3 - Progress (Week 1): Met OT Short Term Goal 4 (Week 1): Pt will be able to transfer to [facility] with max A of 1. OT Short Term Goal 4 - Progress (Week 1): Met Week 2:  OT Short Term Goal 1 (Week 2): Pt will be able to don a shirt with min A OT Short Term Goal 2 (Week 2):  Pt will bathe his buttocks with mod A for standing OT Short Term Goal 3 (Week 2): Pt will complete LB dressing tasks with mod A OT Short Term Goal 4 (Week 2): Pt will complete toileting tasks with max A     Leroy Libman 08/16/2019, 9:19 AM

## 2019-08-16 NOTE — Progress Notes (Signed)
[facility] PHYSICAL MEDICINE & REHABILITATION PROGRESS NOTE   Subjective/Complaints:  LBM Saturday; thought could go, but wasn't able to.  Also, had episodes 1-2x of dribbling after voided- not documented by nursing.  Will see how bladder works while up today with therapy.   Still having pain in L shoulder, but is improving- doesn't want to change meds/do trigger point injections.  .   ROS: patient denies SOB, CP, abd pain, vision changes (is blind) N/V/D/Constipation.   Objective:   No results found. Recent Labs    08/16/19 0517  WBC 7.0  HGB 11.4*  HCT 35.3*  PLT 256   Recent Labs    08/16/19 0517  NA 138  K 3.7  CL 102  CO2 26  GLUCOSE 91  BUN 13  CREATININE 0.72  CALCIUM 9.2    Intake/Output Summary (Last 24 hours) at 08/16/2019 0848 Last data filed at 08/16/2019 0841 Gross per 24 hour  Intake 1234 ml  Output 1975 ml  Net -741 ml     Physical Exam: Vital Signs Blood pressure (!) 142/79, pulse 67, temperature 98.8 F (37.1 C), resp. rate 18, height 5\' 10"  (1.778 m), weight 71.6 kg, SpO2 98 %.  Constitutional: He appears well-developed and well-nourished. Sitting in WC, working with OT, wearing dark sunglasses; c/oo lack of BM NAD  HENT:  Head: Normocephalic and atraumatic.  Eyes: wearing sunglasses   Neck:  ACDF incision site clean and dry with dry dressing- wearing cervical collar Decreased cervical rotation; tight trigger points in L upper traps, levators and scalenes as well as rhomboids- more on L side. TTP down to distal biceps on L arm Cardiovascular: RRR  Respiratory: CTA B/L no W/R/R  GI: Soft, NT, ND, hypoactive BS again; no signs of distension Musculoskeletal:   Neck supple.  Comments: RUE- bicep 4/5, WE 4/5, triceps 4+/5, grip 4-/5, finger 4+/5 LUE- Biceps 3/5, WE 2+/5, triceps 3/5, grip 3/5, finger 3/5. No tenderness over left shoulder joint. RLE- HF 4-/5, KE 4/5, DF 4=/5, PF 4-/5 LLE- HF 2/5, KE 2/5, DF 2/5, PF 2-/5 Neurological:   Hoffman's LUE- strong; (-) Hoffman's in RUE;  no clonus in LEs B/L Sensation intact at C3 B/L; otherwise decreased at every dermatome to L3 B/L ; L4-S2 dermatomes B/L very decreased sensation- same S3-S5 B/L Psychiatric:  Very appropriate and pleasant    Assessment/Plan: 1. Functional deficits secondary to C3 ASIA C/D quadriplegia  which require 3+ hours per day of interdisciplinary therapy in a comprehensive inpatient rehab setting.  Physiatrist is providing close team supervision and 24 hour management of active medical problems listed below.  Physiatrist and rehab team continue to assess barriers to discharge/monitor patient progress toward functional and medical goals  Care Tool:  Bathing    Body parts bathed by patient: Left arm, Chest, Abdomen, Right upper leg, Left upper leg, Face, Front perineal area   Body parts bathed by helper: Right arm, Buttocks, Right lower leg, Left lower leg     Bathing assist Assist Level: Moderate Assistance - Patient 50 - 74%     Upper Body Dressing/Undressing Upper body dressing   What is the patient wearing?: Pull over shirt    Upper body assist Assist Level: Maximal Assistance - Patient 25 - 49%    Lower Body Dressing/Undressing Lower body dressing      What is the patient wearing?: Underwear/pull up, Pants     Lower body assist Assist for lower body dressing: Maximal Assistance - Patient 25 - 49%  Toileting Toileting    Toileting assist Assist for toileting: Total Assistance - Patient < 25%     Transfers Chair/bed transfer  Transfers assist     Chair/bed transfer assist level: Moderate Assistance - Patient 50 - 74%     Locomotion Ambulation   Ambulation assist   Ambulation activity did not occur: Safety/medical concerns  Assist level: Moderate Assistance - Patient 50 - 74% Assistive device: Walker-platform Max distance: 22'   Walk 10 feet activity   Assist  Walk 10 feet activity did not occur:  Safety/medical concerns  Assist level: Moderate Assistance - Patient - 50 - 74% Assistive device: Walker-rolling   Walk 50 feet activity   Assist Walk 50 feet with 2 turns activity did not occur: Safety/medical concerns         Walk 150 feet activity   Assist Walk 150 feet activity did not occur: Safety/medical concerns         Walk 10 feet on uneven surface  activity   Assist Walk 10 feet on uneven surfaces activity did not occur: Safety/medical concerns         Wheelchair     Assist Will patient use wheelchair at discharge?: Yes Type of Wheelchair: Manual Wheelchair activity did not occur: Safety/medical concerns  Wheelchair assist level: Maximal Assistance - Patient 25 - 49% Max wheelchair distance: 5'    Wheelchair 50 feet with 2 turns activity    Assist    Wheelchair 50 feet with 2 turns activity did not occur: Safety/medical concerns       Wheelchair 150 feet activity     Assist  Wheelchair 150 feet activity did not occur: Safety/medical concerns       Blood pressure (!) 142/79, pulse 67, temperature 98.8 F (37.1 C), resp. rate 18, height 5\' 10"  (1.778 m), weight 71.6 kg, SpO2 98 %.    Medical Problem List and Plan:  1. Bilateral upper and lower extremity weakness with paresthesias left greater than right with gait disturbance secondary to cervical myelopathy.S/P multilevel discectomy C3-4, 4-5, 5-6 and C6-7 with decompression of spinal cord and exiting nerve roots with arthrodesis 08/05/2019 per Dr. Kathyrn Sheriff. HARD Cervical collar when out of bed.  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  -patient may Shower if covers neck incision  -ELOS/Goals: Supervision to Min assist - 3-3.5 weeks due to SCI   2/1- will remove honeycomb dressing and place daily dry dressing 2. Antithrombotics:  -DVT/anticoagulation: SCDs. Discuss Lovenox for DVT prophylaxis with neurosurgery. Check vascular study   1/27- will  start lovenox due to increased risk of DVT -antiplatelet therapy: Aspirin 81 mg to begin 08/12/2019  3. Pain Management: Post op pain, nerve pain and spasticity- Hydrocodone/Robaxin as needed  -will try and change to Baclofen for muscle spasms/spasticity once gets to CIR and possibly add Duloxetine for nerve pain   1/27- added baclofen 5 mg TID for spasticity and Duloxetine 30 mg QHS for nerve pain  1/28- add Lidoderm patches to L neck during day. 12 hrs on;12 hrs off  1/29- will increase Cymbalta on Monday. Lidoderm effective for L shoulder pain.   1/30: pain is well controlled  1/31: Complains of some pain over left shoulder. Lidocaine patch on. Not tender to palpation. Likely secondary to scapular destablization given left upper extremity hemiparesis. Ordered diclofenac gel 4 times daily prn for pain.   2/1- will increase Cymbalta to 60 mg daily.  4. Mood: Provide emotional support  -antipsychotic agents: N/A  5.  Neuropsych: This patient is capable of making decisions on his own behalf.  6. Skin/Wound Care: Routine skin checks  7. Fluids/Electrolytes/Nutrition: Routine in and outs with follow-up chemistries  8. Hypertension. Toprol-XL 25 mg daily, Lotensin 10 mg daily, Norvasc 5 mg daily. Monitor with increased mobility   1/30: Uncontrolled on last several reads. Will increase Norvasc to 10mg  daily.   1/31: Reviewed flowsheet. Elevated this morning. Within normal limits in afternoon and evening. Continue to monitor.  9. Diabetes mellitus. Hemoglobin A1c 6.3. Glucophage 500 mg daily. Check blood sugars before meals and at bedtime   CBG (last 3)  Recent Labs    08/15/19 1643 08/15/19 2109 08/16/19 0542  GLUCAP 99 129* 101*    2/2-- BGs well controlled - 99-129 10. Glaucoma/legally blind. Continue eyedrops as directed- continue pt's dark sunglasses for light sensitivity.  11. Hyperlipidemia. Lipitor  12. Constipation in setting of Neurogenic bowel. Adjust bowel program as  needed.  1/28- LBM early AM 1/27  2/2- LBM Saturday- 4 days- will give Mg citrate and follow with suppository if needed.  13. Neurogenic bladder- will check PVRS to make sure is emptying since needed a in/out cath due to lack of voiding 2 days ago.  1/27- ordered PVRs  1/29- added Flomax 0.4mg  qsupper since doesn't feel like emptying.   2/2- dribbling- will monitor  14/ Hypokalemia  1/27- K+ 3.4- will replete  1/28- labs in AM  1/29- K+ 3.8  2/1- check labs in AM  2/2- K+ 3.7  LOS: 7 days A FACE TO FACE EVALUATION WAS PERFORMED  Brian Daniels 08/16/2019, 8:48 AM

## 2019-08-16 NOTE — Progress Notes (Signed)
Physical Therapy Session Note  Patient Details  Name: Brian Daniels MRN: [id number] Date of Birth: 28-Apr-1956  Today's Date: 08/16/2019 PT Individual Time: 1300-1358 PT Individual Time Calculation (min): 58 min   Short Term Goals: Week 1:  PT Short Term Goal 1 (Week 1): Pt will complete bed mobility with assist x 1 consistently PT Short Term Goal 2 (Week 1): Pt will perform least restrictive transfer with assist x 1 consistently PT Short Term Goal 3 (Week 1): Pt will initiate gait training as safe and able  Skilled Therapeutic Interventions/Progress Updates:    Pt able to come to EOB to the L with CGA (HOB elevated and use of bedrail for support) with cues for scooting hips forward. PT assisted with donning of cervical collar and shoes with total assist. Performed mod assist squat pivot transfer to the L form bed to w/c with cues for technique and hand placement due to visual impairments. Focused on NMR during gait with RW with L hand orthosis (progression from pFRW), sit <> stand retraining, and pre-gait activities with focus on equal weightbearing, weightshifting and postural control re-training. During gait able to perform with mod to occasional max assist to correct for lean to the R (+2 for safety with w/c follow) x 50' requiring some occasional steering of RW due to LUE weakness but generally improved positioning than with PFRW. Pt with tendency for RLE to cross midline and difficulty maintaining postural control during step through with LLE with strong R lateral lean at times. After gait, trialled use of towel and theraband on lower part of R side of RW for tactile cue for patient to know how far wide patient needs to keep R foot when planting it for a step during gait. Mod assist for sit < > stands with focus and facilitation for anterior weightshift and cues for hip and trunk extension to achieve full upright posture. In standing performed weightshifting and single limb stance on LLE for  strengthening and balance retraining. End of session set up in w/c with all needs in reach and towel placed behind head for improved cervical alignment and increased sitting tolerance.   Therapy Documentation Precautions:  Precautions Precautions: Fall Precaution Comments: pt legally blind in both eyes Required Braces or Orthoses: Cervical Brace Cervical Brace: Hard collar Restrictions Weight Bearing Restrictions: No  Pain:  Denies pain.    Therapy/Group: Individual Therapy  [name] [name], PT, DPT, CBIS  08/16/2019, 3:08 PM

## 2019-08-16 NOTE — Patient Care Conference (Signed)
Inpatient RehabilitationTeam Conference and Plan of Care Update Date: 08/16/2019   Time: 11:10 AM    Patient Name: Brian Daniels      Medical Record Number: [id number]  Date of Birth: 24-Feb-1956 Sex: Male         Room/Bed: 4M02C/4M02C-01 Payor Info: Payor: [place] / Plan: BCBS MEDICARE / Product Type: *No Product type* /    Admit Date/Time:  08/09/2019  4:03 PM  Primary Diagnosis:  Cervical myelopathy ([place])  Patient Active Problem List   Diagnosis Date Noted  . Blindness 08/11/2019  . Neurogenic bladder 08/09/2019  . Spasticity 08/09/2019  . 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 Cervical myelopathy ([place]) 06/30/2019  . Bilateral low back pain without sciatica 06/13/2019  . Weakness 06/13/2019  . Paresthesia 06/13/2019  . Urinary urgency 06/13/2019  . Gait abnormality 06/13/2019    Expected Discharge Date: Expected Discharge Date: 08/31/19  Team Members Present: Physician leading conference: Dr. Courtney Heys Social Worker Present: Lennart Pall, [place], [place]) Nurse Present: Judee Clara, LPN Case Manager: Karene Fry, RN PT Present: Excell Seltzer, PT OT Present: Willeen Cass, OT;Roanna Epley, COTA SLP Present: Stormy Fabian, SLP PPS Coordinator present : Gunnar Fusi, Novella Olive, PT     Current Status/Progress Goal Weekly Team Focus  Bowel/Bladder   mostly continent w/ few episodes of urine incontinence  decrease episodes of urinary incontinence d/t urgency  urinary urgency   Swallow/Nutrition/ Hydration             ADL's   bed mobility-mod A; bathing-mod A; UB dressing-max A; LB dressing-tot A; sit<>stand-mod a; squat pivot transfers-mod a; toileting-max A; L shoulder functiona improving  min A overall  BADL retraining, functional transfers; education, activity tolerance, safety awareness   Mobility   mod A bed mobility, mod A squat pivot transfer, sit to stand mod A to L PFRW, gait up to 33 ft with L  PFRW and mod A  min A overall  transfers, L NMR, gait training   Communication             Safety/Cognition/ Behavioral Observations            Pain   pain adequately controlled with prn norco  adequate pain control without narcotic  decrease norco intake; adequately provide pain relief via other interventions   Skin   surgical incision appears to be healing well  continue healing process with no s/s of infection  provide wound care to decrease chance of infection    Rehab Goals Patient on target to meet rehab goals: Yes *See Care Plan and progress notes for long and short-term goals.     Barriers to Discharge  Current Status/Progress Possible Resolutions Date Resolved   Nursing                  PT  Other (comments)  legally blind, increased need for cues for safety              OT                  SLP                SW                Discharge Planning/Teaching Needs:  Plan to discharge to his sister's home here in [place]; has been living with her for atleast 1 yr.  Family education to ensure she can continue to assist pt  with his care needs.   Team Discussion: Legally blind, has glaucoma, incomplete SCI, L shoulder pain, neck pain, lidocaine patches ordered and increased cymbalta, started baclophen, BS doing well, dribbling urine.  RN cont/inc episodes.  PT min bed, transfers mod A, mon/max stand walker, amb 30' mod A, min a goals overall.  OT goals min A, bed mod A, sit to stand mod A, toileting max/tot, LB dressing max/tot, UB Dressing mod A.   Revisions to Treatment Plan: N/A     Medical Summary Current Status: pain "tolerable" overall continence except dribbling; BGs <120; Weekly Focus/Goal: good progress- min assist bed mob; squat pivot mod assist; RW leans posteriorly- 30 ft RW mod assist- goals min assist? maybe better  Barriers to Discharge: Decreased family/caregiver support;Home enviroment access/layout;Weight bearing restrictions;Other (comments)  Barriers  to Discharge Comments: blind due to glaucoma Possible Resolutions to Barriers: goals min assist; toileting max to total assist; LB max assist to D; UB mod assist; pain is improved daily; using L hand better;   Continued Need for Acute Rehabilitation Level of Care: The patient requires daily medical management by a physician with specialized training in physical medicine and rehabilitation for the following reasons: Direction of a multidisciplinary physical rehabilitation program to maximize functional independence : Yes Medical management of patient stability for increased activity during participation in an intensive rehabilitation regime.: Yes Analysis of laboratory values and/or radiology reports with any subsequent need for medication adjustment and/or medical intervention. : Yes   I attest that I was present, lead the team conference, and concur with the assessment and plan of the team.   Trish Mage 08/18/2019, 10:00 AM   Team conference was held via web/ teleconference due to COVID - 19

## 2019-08-16 NOTE — Progress Notes (Signed)
Occupational Therapy Session Note  Patient Details  Name: Brian Daniels MRN: [id number] Date of Birth: Jun 17, 1956  Today's Date: 08/16/2019 OT Individual Time: 1130-1208 OT Individual Time Calculation (min): 38 min    Short Term Goals: Week 2:  OT Short Term Goal 1 (Week 2): Pt will be able to don a shirt with min A OT Short Term Goal 2 (Week 2): Pt will bathe his buttocks with mod A for standing OT Short Term Goal 3 (Week 2): Pt will complete LB dressing tasks with mod A OT Short Term Goal 4 (Week 2): Pt will complete toileting tasks with max A  Skilled Therapeutic Interventions/Progress Updates:    Pt resting in bed upon arrival. OT intervention with focus on LUE AROM/AAROM to increase functional use and independence with BADLs. See below. Pt requested to have his "diaper" changed.  Pt incontinent of bladder and required tot A for changing.  Pt able to bridge in bed to facilitate doffing/donning pants. After new brief donned and pants pulled over hips, pt requested use of urinal. Pt voided 200 ml in urinal and completed hygiene. Pt remained in bed with all needs within reach and bed alarm activated.   Therapy Documentation Precautions:  Precautions Precautions: Fall Precaution Comments: pt legally blind in both eyes Required Braces or Orthoses: Cervical Brace Cervical Brace: Hard collar Restrictions Weight Bearing Restrictions: No  Pain: L shoulder discomfort with activity; repositoined Exercises: Shoulder Exercises Shoulder Flexion: AROM;AAROM;Left;Supine Elbow Extension: AROM;Left;10 reps;Supine Wrist Flexion: AROM;Left;10 reps;Supine Digit Composite Flexion: AROM;Left;10 reps;Supine;Squeeze ball Composite Extension: AROM;Left;10 reps;Supine;Squeeze ball Hand Exercises Forearm Supination: AROM;AAROM;Left Forearm Pronation: AROM;AAROM;Left  Therapy/Group: Individual Therapy  Rich Brave 08/16/2019, 12:22 PM

## 2019-08-16 NOTE — Progress Notes (Signed)
Pt slept well throughout most of shift. No c/o pain over 2/10. No prn medication requested. Resident requested urinal x2 throughout shift with good output. Repositioned at this time for comfort. No s/s of distress. Bed in lowest position with call bell within reach. Bed alarm on and functioning properly.

## 2019-08-16 NOTE — Progress Notes (Signed)
Social Work Patient ID: Brian Daniels, male   DOB: [birth year], 64 y.o.   MRN: [id number]    SW met with pt in room, and called pt sister [name] [phone]) to provide updates from team, and inform on anticipated d/c date 08/31/2019. SW provided [employer] for adaptive equipment through [employer] Reuben Likes [phone]), and encouraged to call to begin initial application. She would also like to come and complete caregiver education. She would like to attend to last therapy session.  *SW  followed up with pt sister [name] to inform last therapy session is 1pm-2:15pm with PT. She intends to be present. Sw informed PT Lovena Le on above.   Loralee Pacas, MSW, Social Worker Office (preferred): [phone] Cell: [phone] Fax: [phone]

## 2019-08-16 NOTE — Progress Notes (Signed)
Physical Therapy Session Note  Patient Details  Name: Wynter Grave MRN: [id number] Date of Birth: 1955-11-06  Today's Date: 08/16/2019 PT Individual Time: 5329-9242 PT Individual Time Calculation (min): 40 min   Short Term Goals: Week 1:  PT Short Term Goal 1 (Week 1): Pt will complete bed mobility with assist x 1 consistently PT Short Term Goal 2 (Week 1): Pt will perform least restrictive transfer with assist x 1 consistently PT Short Term Goal 3 (Week 1): Pt will initiate gait training as safe and able  Skilled Therapeutic Interventions/Progress Updates:    Pt received seated on BSC in room finishing having a BM. Pt is mod A to stand to PFRW and dependent for pericare. Pt is max A for clothing management. Stand pivot transfer BSC to w/c with PFRW and max A due to posterior lean with max cueing for safety. Trial use of L hand orthosis on RW as pt demonstrates improved grip with LUE this date. Pt is able to perform several SPT with RW with L hand orthosis with mod A. Will continue to assess use of L hand orthosis vs platform on RW. Pt left seated in w/c in room with needs in reach, quick release belt and chair alarm in place at end of session.  Therapy Documentation Precautions:  Precautions Precautions: Fall Precaution Comments: pt legally blind in both eyes Required Braces or Orthoses: Cervical Brace Cervical Brace: Hard collar Restrictions Weight Bearing Restrictions: No    Therapy/Group: Individual Therapy   Peter Congo, PT, DPT  08/16/2019, 12:45 PM

## 2019-08-16 NOTE — Progress Notes (Signed)
Occupational Therapy Session Note  Patient Details  Name: Brian Daniels MRN: [id number] Date of Birth: 17-Jul-1955  Today's Date: 08/16/2019 OT Individual Time: 0700-0800 OT Individual Time Calculation (min): 60 min    Short Term Goals: Week 1:  OT Short Term Goal 1 (Week 1): Pt will be able to maintain static sitting with S to increase safety to be able to sit on a tub bench. OT Short Term Goal 2 (Week 1): Pt will be able to don a shirt with min A OT Short Term Goal 3 (Week 1): Pt will be able to rise to stand with max A of 1. OT Short Term Goal 4 (Week 1): Pt will be able to transfer to [facility] with max A of 1.  Skilled Therapeutic Interventions/Progress Updates:    Pt resting in bed upon arrival.  OT intervention with focus on bed mobility, functional transfers, BADL training, sit<>stand, standing balance, and activity tolerance to increase independence with BADLs. Supine>sit EOB with mod A.  Squat pivot transfer to w/c with mod A.  See Care Tool for assistance with bathing/dressing. Sit<>stand min A with Stedy for LB bathing/dressing tasks.  Pt using his LUE/hand more during session and initiates use for bathing/dressing tasks.  Pt able to wring out wash cloth with L hand this morning.  Pt remained seated in w/c with all needs within reach and belt alarm activated.   Therapy Documentation Precautions:  Precautions Precautions: Fall Precaution Comments: pt legally blind in both eyes Required Braces or Orthoses: Cervical Brace Cervical Brace: Hard collar Restrictions Weight Bearing Restrictions: No  Pain: Pt c/o L shoulder pain this morning but relieved with activity     Therapy/Group: Individual Therapy  Rich Brave 08/16/2019, 9:12 AM

## 2019-08-17 ENCOUNTER — Inpatient Hospital Stay (HOSPITAL_COMMUNITY): Payer: Medicare Other | Admitting: Physical Therapy

## 2019-08-17 ENCOUNTER — Inpatient Hospital Stay (HOSPITAL_COMMUNITY): Payer: Medicare Other

## 2019-08-17 LAB — GLUCOSE, CAPILLARY
Glucose-Capillary: 80 mg/dL (ref 70–99)
Glucose-Capillary: 124 mg/dL — ABNORMAL HIGH (ref 70–99)
Glucose-Capillary: 100 mg/dL — ABNORMAL HIGH (ref 70–99)
Glucose-Capillary: 157 mg/dL — ABNORMAL HIGH (ref 70–99)

## 2019-08-17 NOTE — Progress Notes (Signed)
[facility] PHYSICAL MEDICINE & REHABILITATION PROGRESS NOTE   Subjective/Complaints:   LBM yesterday- had 2 large BMs- didn't take Mg citrate per pt.  Per OT, getting more function daily from LUE- is improving daily.   Wasn't on Scopalamine at home and doesn't know why is on it.     ROS: patient denies SOB, CP, abd pain, vision changes (is blind) N/V/D/Constipation.   Objective:   No results found. Recent Labs    08/16/19 0517  WBC 7.0  HGB 11.4*  HCT 35.3*  PLT 256   Recent Labs    08/16/19 0517  NA 138  K 3.7  CL 102  CO2 26  GLUCOSE 91  BUN 13  CREATININE 0.72  CALCIUM 9.2    Intake/Output Summary (Last 24 hours) at 08/17/2019 0846 Last data filed at 08/17/2019 0354 Gross per 24 hour  Intake 520 ml  Output 1000 ml  Net -480 ml     Physical Exam: Vital Signs Blood pressure (!) 141/79, pulse 61, temperature 98.2 F (36.8 C), temperature source Oral, resp. rate 16, height 5\' 10"  (1.778 m), weight 76.1 kg, SpO2 100 %.  Constitutional: He appears well-developed and well-nourished. Sitting in WC, working with OT, wearing dark sunglasses; c/o L shoulder pain; NAD  HENT:  Head: Normocephalic and atraumatic.  Eyes: wearing sunglasses   Neck:  ACDF incision site clean and dry with dry dressing- a little wrinkled- wearing cervical collar Decreased cervical rotation; tight trigger points in L upper traps, levators and scalenes as well as rhomboids- more on L side. TTP down to distal biceps on L arm Cardiovascular: RRR  Respiratory: CTA B/L no W/R/R  GI: Soft, NT, ND, hypoactive BS again; no signs of distension Musculoskeletal:   Neck supple. No swelling anymore of LUE- nml size/swelling of RUE/hand  Comments: RUE- bicep 4/5, WE 4/5, triceps 4+/5, grip 4-/5, finger 4+/5 LUE- Biceps 3/5, WE 2+/5, triceps 3/5, grip 3/5, finger 3/5. No tenderness over left shoulder joint. RLE- HF 4-/5, KE 4/5, DF 4=/5, PF 4-/5 LLE- HF 2/5, KE 2/5, DF 2/5, PF 2-/5 Neurological:   Hoffman's LUE- strong; (-) Hoffman's in RUE;  no clonus in LEs B/L Sensation intact at C3 B/L; otherwise decreased at every dermatome to L3 B/L ; L4-S2 dermatomes B/L very decreased sensation- same S3-S5 B/L Psychiatric:  Very appropriate and pleasant    Assessment/Plan: 1. Functional deficits secondary to C3 ASIA C/D quadriplegia  which require 3+ hours per day of interdisciplinary therapy in a comprehensive inpatient rehab setting.  Physiatrist is providing close team supervision and 24 hour management of active medical problems listed below.  Physiatrist and rehab team continue to assess barriers to discharge/monitor patient progress toward functional and medical goals  Care Tool:  Bathing    Body parts bathed by patient: Left arm, Chest, Abdomen, Front perineal area, Right upper leg, Left upper leg   Body parts bathed by helper: Right arm, Buttocks, Left lower leg, Right lower leg, Face     Bathing assist Assist Level: Moderate Assistance - Patient 50 - 74%     Upper Body Dressing/Undressing Upper body dressing   What is the patient wearing?: Pull over shirt    Upper body assist Assist Level: Moderate Assistance - Patient 50 - 74%    Lower Body Dressing/Undressing Lower body dressing      What is the patient wearing?: Underwear/pull up, Pants     Lower body assist Assist for lower body dressing: Maximal Assistance - Patient 25 -  49%     Toileting Toileting    Toileting assist Assist for toileting: Maximal Assistance - Patient 25 - 49%     Transfers Chair/bed transfer  Transfers assist     Chair/bed transfer assist level: Moderate Assistance - Patient 50 - 74%     Locomotion Ambulation   Ambulation assist   Ambulation activity did not occur: Safety/medical concerns  Assist level: 2 helpers Assistive device: Walker-rolling(hand orthosis) Max distance: 50'   Walk 10 feet activity   Assist  Walk 10 feet activity did not occur: Safety/medical  concerns  Assist level: Moderate Assistance - Patient - 50 - 74% Assistive device: Walker-rolling, Orthosis   Walk 50 feet activity   Assist Walk 50 feet with 2 turns activity did not occur: Safety/medical concerns  Assist level: 2 helpers      Walk 150 feet activity   Assist Walk 150 feet activity did not occur: Safety/medical concerns         Walk 10 feet on uneven surface  activity   Assist Walk 10 feet on uneven surfaces activity did not occur: Safety/medical concerns         Wheelchair     Assist Will patient use wheelchair at discharge?: Yes Type of Wheelchair: Manual Wheelchair activity did not occur: Safety/medical concerns  Wheelchair assist level: Maximal Assistance - Patient 25 - 49% Max wheelchair distance: 5'    Wheelchair 50 feet with 2 turns activity    Assist    Wheelchair 50 feet with 2 turns activity did not occur: Safety/medical concerns       Wheelchair 150 feet activity     Assist  Wheelchair 150 feet activity did not occur: Safety/medical concerns       Blood pressure (!) 141/79, pulse 61, temperature 98.2 F (36.8 C), temperature source Oral, resp. rate 16, height 5\' 10"  (1.778 m), weight 76.1 kg, SpO2 100 %.    Medical Problem List and Plan:  1. Bilateral upper and lower extremity weakness with paresthesias left greater than right with gait disturbance secondary to cervical myelopathy.S/P multilevel discectomy C3-4, 4-5, 5-6 and C6-7 with decompression of spinal cord and exiting nerve roots with arthrodesis 08/05/2019 per Dr. Kathyrn Sheriff. HARD Cervical collar when out of bed.  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  -patient may Shower if covers neck incision  -ELOS/Goals: Supervision to Min assist - 3-3.5 weeks due to SCI   2/1- will remove honeycomb dressing and place daily dry dressing   2. Antithrombotics:  -DVT/anticoagulation: SCDs. Discuss Lovenox for DVT prophylaxis with  neurosurgery. Check vascular study   1/27- will start lovenox due to increased risk of DVT -antiplatelet therapy: Aspirin 81 mg to begin 08/12/2019  3. Pain Management: Post op pain, nerve pain and spasticity- Hydrocodone/Robaxin as needed  -will try and change to Baclofen for muscle spasms/spasticity once gets to CIR and possibly add Duloxetine for nerve pain   1/27- added baclofen 5 mg TID for spasticity and Duloxetine 30 mg QHS for nerve pain  1/28- add Lidoderm patches to L neck during day. 12 hrs on;12 hrs off  1/29- will increase Cymbalta on Monday. Lidoderm effective for L shoulder pain.   1/30: pain is well controlled  1/31: Complains of some pain over left shoulder. Lidocaine patch on. Not tender to palpation. Likely secondary to scapular destablization given left upper extremity hemiparesis. Ordered diclofenac gel 4 times daily prn for pain.   2/1- will increase Cymbalta to 60 mg daily.  4.  Mood: Provide emotional support  -antipsychotic agents: N/A  5. Neuropsych: This patient is capable of making decisions on his own behalf.  6. Skin/Wound Care: Routine skin checks  7. Fluids/Electrolytes/Nutrition: Routine in and outs with follow-up chemistries  8. Hypertension. Toprol-XL 25 mg daily, Lotensin 10 mg daily, Norvasc 5 mg daily. Monitor with increased mobility   1/30: Uncontrolled on last several reads. Will increase Norvasc to 10mg  daily.   1/31: Reviewed flowsheet. Elevated this morning. Within normal limits in afternoon and evening. Continue to monitor.  9. Diabetes mellitus. Hemoglobin A1c 6.3. Glucophage 500 mg daily. Check blood sugars before meals and at bedtime   CBG (last 3)  Recent Labs    08/16/19 1640 08/16/19 2053 08/17/19 0618  GLUCAP 70 225* 80    2/2-- BGs well controlled - 99-129  2/3- BGs 70-80- with 1 of 225- won't increase regimen anymore.  10. Glaucoma/legally blind. Continue eyedrops as directed- continue pt's dark sunglasses for light sensitivity.  11.  Hyperlipidemia. Lipitor  12. Constipation in setting of Neurogenic bowel. Adjust bowel program as needed.  1/28- LBM early AM 1/27  2/2- LBM Saturday- 4 days- will give Mg citrate and follow with suppository if needed.   2/3- had good sized BM yesterday without mg citrate.  13. Neurogenic bladder- will check PVRS to make sure is emptying since needed a in/out cath due to lack of voiding 2 days ago.  1/27- ordered PVRs  1/29- added Flomax 0.4mg  qsupper since doesn't feel like emptying.   2/2- dribbling- will monitor  14/ Hypokalemia  1/27- K+ 3.4- will replete  1/28- labs in AM  1/29- K+ 3.8  2/1- check labs in AM  2/2- K+ 3.7  LOS: 8 days A FACE TO FACE EVALUATION WAS PERFORMED  Timeka Goette 08/17/2019, 8:46 AM

## 2019-08-17 NOTE — Progress Notes (Signed)
Occupational Therapy Session Note  Patient Details  Name: Brian Daniels MRN: [id number] Date of Birth: 09/17/1955  Today's Date: 08/17/2019 OT Individual Time: 7209-4709 OT Individual Time Calculation (min): 73 min    Short Term Goals: Week 2:  OT Short Term Goal 1 (Week 2): Pt will be able to don a shirt with min A OT Short Term Goal 2 (Week 2): Pt will bathe his buttocks with mod A for standing OT Short Term Goal 3 (Week 2): Pt will complete LB dressing tasks with mod A OT Short Term Goal 4 (Week 2): Pt will complete toileting tasks with max A  Skilled Therapeutic Interventions/Progress Updates:    OT intervention with focus on bed mobility, sit<>stand, standing balance, functional transfers, BADL training, and safety awareness to increase independence with BADLs. Bed mobility with min A. Sit<>stand with RW with min/mod A. Squat pivot transfer bed>w/c with mod/min A. See Care Tool for ADL assist. Pt initiating LUE during functional tasks and increased shoulder AROM noted. Pt states pain is not as "bad" as previously.  Standing balance for LB clothing management with mod A with posterior lean onto heels noted.  Pt can correct with tactile cues. Pt remained in w/c with all needs within reach and belt alarm activated.   Therapy Documentation Precautions:  Precautions Precautions: Fall Precaution Comments: pt legally blind in both eyes Required Braces or Orthoses: Cervical Brace Cervical Brace: Hard collar Restrictions Weight Bearing Restrictions: No  Pain:  Pt c/o discomfort/tightness in LUE; relieved with activity and stretching   Therapy/Group: Individual Therapy  Rich Brave 08/17/2019, 8:15 AM

## 2019-08-17 NOTE — Progress Notes (Signed)
Physical Therapy Weekly Progress Note  Patient Details  Name: Brian Daniels MRN: [id number] Date of Birth: [birth year]  Beginning of progress report period: August 10, 2019 End of progress report period: August 17, 2019  Today's Date: 08/17/2019 PT Individual Time: 1000-1100; 1300-1415 PT Individual Time Calculation (min): 60 min and 75 min  Patient has met 3 of 3 short term goals.  Patient has made great progress over the past week and is currently at min A level for bed mobility, mod A for transfers, mod A for gait up to 60 ft with RW with use of L hand orthosis, and min A for w/c mobility. Pt remains limited by ongoing L UE/LE weakness and baseline visual deficits.  Patient continues to demonstrate the following deficits muscle weakness and muscle joint tightness, decreased cardiorespiratoy endurance, abnormal tone, unbalanced muscle activation, ataxia and decreased coordination, decreased visual acuity and decreased standing balance, decreased postural control and decreased balance strategies and therefore will continue to benefit from skilled PT intervention to increase functional independence with mobility.  Patient progressing toward long term goals..  Continue plan of care.  PT Short Term Goals Week 1:  PT Short Term Goal 1 (Week 1): Pt will complete bed mobility with assist x 1 consistently PT Short Term Goal 1 - Progress (Week 1): Met PT Short Term Goal 2 (Week 1): Pt will perform least restrictive transfer with assist x 1 consistently PT Short Term Goal 2 - Progress (Week 1): Met PT Short Term Goal 3 (Week 1): Pt will initiate gait training as safe and able PT Short Term Goal 3 - Progress (Week 1): Met Week 2:  PT Short Term Goal 1 (Week 2): Pt will complete transfers with min A consistently PT Short Term Goal 2 (Week 2): Pt will ambulate x 75 ft with LRAD and min A PT Short Term Goal 3 (Week 2): Pt will propel manual w/c x 150 ft at Supervision level  Skilled Therapeutic  Interventions/Progress Updates:    Session 1: Pt received seated in w/c in room, agreeable to PT session. No complaints of pain. Sit to stand with mod A to RW. Ambulation 2 x 30 ft, 1 x 60 ft with RW with mod A for steering with use of L hand orthosis on RW. Pt exhibits ongoing narrow BOS with tactile cueing on RW for increasing BOS during gait. Pt also tends to path deviate to the R during gait. Introduced manual w/c propulsion this session. Pt is able to propel w/c with use of BUE and BLE with addition of blue theraband to L wheel rim for improved grip. Pt is able to propel w/c x 150 ft at min A level for steering. Pt requests to return to bed at end of session. Squat pivot transfer back to bed with min A. Sit to supine min A for RLE management. Pt left semi-reclined in bed with needs in reach at end of session.  Session 2: Pt received seated in bed finishing lunch with assist from NT. Pt's sister [name] present during therapy session in order to observe patient progress so far. Pt is min A for supine to sit from flat bed with use of bedrail. Discussed pt's current need for a bedrail and will provide handout to patient's sister for where to purchase bedrail if they choose to do so. Stand pivot transfer bed to [facility] with RW. Demonstrated how pt performs transfers on/off BSC and benefits of using a BSC over a toilet for increased seat height and  UE support. Stand pivot transfer to w/c with RW and mod A, max cueing due to visual deficits. Ambulation x 50 ft with RW and min to mod A for steering, v/c for widening BOS and assist for steering R as pt tends to deviate to the R. Standing alt L/R 6" step taps with RW and min A for balance for BLE strengthening and coordination. Standing mini-squats x 10 reps with RW and min A for balance for BLE strengthening. Seated LUE fine motor control task with use of level tan theraputty: pt rolls theraputty into snake with LUE then pinches length of putty with forefinger and  thumb, rolls putty back into a ball, then repeats the process pinching with each digit of the hand. Pt struggles to complete task with middle finger due to ongoing N/T and decreased sensation in this digit. Pt requests to return to bed at end of session. Squat pivot transfer back to bed with mod A. Sit to supine min A. Pt left semi-reclined in bed with needs in reach at end of session, bed alarm in place, sister present.   Therapy Documentation Precautions:  Precautions Precautions: Fall Precaution Comments: pt legally blind in both eyes Required Braces or Orthoses: Cervical Brace Cervical Brace: Hard collar Restrictions Weight Bearing Restrictions: No   Therapy/Group: Individual Therapy   Excell Seltzer, PT, DPT  08/17/2019, 3:29 PM

## 2019-08-18 ENCOUNTER — Inpatient Hospital Stay (HOSPITAL_COMMUNITY): Payer: Medicare Other

## 2019-08-18 ENCOUNTER — Inpatient Hospital Stay (HOSPITAL_COMMUNITY): Payer: Medicare Other | Admitting: *Deleted

## 2019-08-18 ENCOUNTER — Inpatient Hospital Stay (HOSPITAL_COMMUNITY): Payer: Medicare Other | Admitting: Physical Therapy

## 2019-08-18 LAB — GLUCOSE, CAPILLARY
Glucose-Capillary: 96 mg/dL (ref 70–99)
Glucose-Capillary: 107 mg/dL — ABNORMAL HIGH (ref 70–99)
Glucose-Capillary: 85 mg/dL (ref 70–99)
Glucose-Capillary: 113 mg/dL — ABNORMAL HIGH (ref 70–99)

## 2019-08-18 NOTE — Evaluation (Signed)
Recreational Therapy Assessment and Plan  Patient Details  Name: Brian Daniels MRN: [id number] Date of Birth: 06/24/1956 Today's Date: 08/18/2019  Rehab Potential: Good ELOS: discharge 2/17  Assessment Clinical Impression:  Problem List:      Patient Active Problem List   Diagnosis Date Noted  . Neurogenic bladder 08/09/2019  . Spasticity 08/09/2019  . Stenosis of cervical spine with myelopathy ([place]) 08/05/2019  . Cervical myelopathy ([place]) 06/30/2019  . Bilateral low back pain without sciatica 06/13/2019  . Weakness 06/13/2019  . Paresthesia 06/13/2019  . Urinary urgency 06/13/2019  . Gait abnormality 06/13/2019    Past Medical History:      Past Medical History:  Diagnosis Date  . Cataracts, bilateral    removed by surgery  . Diabetes mellitus without complication ([place])    type 2  . Glaucoma    bilateral  . Gout   . Hypercholesteremia   . Hypertension   . Left atrial enlargement   . Legally blind    bilateral  . Neuropathy    hands, arm  . Numbness    hands, arms  . Wears partial dentures    lower  . Wheelchair bound    since 05/30/19   Past Surgical History:       Past Surgical History:  Procedure Laterality Date  . ANTERIOR CERVICAL DECOMPRESSION/DISCECTOMY FUSION 4 LEVELS N/A 08/05/2019   Procedure: ANTERIOR CERVICAL DECOMPRESSION/DISCECTOMY FUSION CERVICAL THREE- CERVICAL FOUR, CERVICAL FOUR- CERVICAL FIVE, CERVICAL FIVE- CERVICAL SIX, CERVICAL SIX- CERVICAL SEVEN;  Surgeon: Consuella Lose, MD;  Location: [place];  Service: Neurosurgery;  Laterality: N/A;  anterior  . COLONOSCOPY    . EYE SURGERY Bilateral    multiple surgeries bilateral -   . MULTIPLE TOOTH EXTRACTIONS    . WISDOM TOOTH EXTRACTION      Assessment & Plan Clinical Impression:  Brian Daniels is a 64 year old right-handed male with history of hypertension, hyperlipidemia, type 2 diabetes mellitus and legally blind secondary to glaucoma. Per chart  review patient lives with his sister. 1 level home no steps to entry. By report patient has been essentially wheelchair-bound since 05/30/2019. He denies any bowel or bladder disturbances. Patient has been followed outpatient neurology for gait abnormality. Presented 08/05/2019 to the ED accompanied by his sister with inability to get out of his chair after recent fall and reported bilateral upper and lower body weakness with paresthesias left greater than right that has progressed over the last 2 months. Cranial CT scan 05/30/2019 showed no acute intracranial abnormality and MRI of the brain 06/27/2019 moderate perisylvian atrophy and moderate ventriculomegaly. Moderate to severe spinal stenosis at C3-4 noted partially on sagittal views. No acute findings noted... Recent MRI cervical spine demonstrates significant multilevel stenosis from C3-4 through C6-7 including T2 signal change in the spinal cord. Recent MRI that was completed of the brain although it did show some ventriculomegaly symptoms most consistent with cervical myelopathy rather than normal pressure hydrocephalus after being reviewed by neurosurgery. Patient underwent discectomy at C3-4, 4-5, 5-6, C6-7 for decompression of spinal cord and exiting nerve roots placement of intervertebral biomechanical device/placement of anterior instrumentation consisting of interbody plate and screws as well as arthrodesis 08/05/2019 per Dr. Kathyrn Sheriff. Hard cervical collar when out of bed.[name] course pain management. Tolerating a regular consistency diet. Therapy evaluations completed with recommendations of physical medicine rehab consult. Patient was admitted for a comprehensive rehab program. Patient transferred to CIR on 08/09/2019.   Pt presents with decreased activity tolerance, decreased functional mobility, decreased balance,  decreased coordination, decreased vision, decreased awareness of community leisure resources Limiting pt's independence with  leisure/community pursuits.  Met with pt today to discuss TR services, leisure interests and community leisure resources.  Plan Min 1 TR session>20 minutes per week during LOS  Recommendations for other services: None   Discharge Criteria: Patient will be discharged from TR if patient refuses treatment 3 consecutive times without medical reason.  If treatment goals not met, if there is a change in medical status, if patient makes no progress towards goals or if patient is discharged from hospital.  The above assessment, treatment plan, treatment alternatives and goals were discussed and mutually agreed upon: by patient  [place] 08/18/2019, 11:30 AM

## 2019-08-18 NOTE — Progress Notes (Signed)
Social Work Patient ID: Brian Daniels, male   DOB: 12-02-1955, 64 y.o.   MRN: [id number]    SW called pt sister [name] [phone]) to inform on recommendation of RW, and to inform she would need to private purchase item because insurance will not cover since pt has a w/c. SW indicated there will be further follow-up with recommendations closer towards discharge. No questions/concerns reported.   Cecile Sheerer, MSW, Social Worker Office (preferred): [phone] Cell: [phone] Fax: [phone]

## 2019-08-18 NOTE — Progress Notes (Signed)
Physical Therapy Session Note  Patient Details  Name: Brian Daniels MRN: [id number] Date of Birth: 30-Nov-1955  Today's Date: 08/18/2019 PT Individual Time: 0900-1000 PT Individual Time Calculation (min): 60 min   Short Term Goals: Week 2:  PT Short Term Goal 1 (Week 2): Pt will complete transfers with min A consistently PT Short Term Goal 2 (Week 2): Pt will ambulate x 75 ft with LRAD and min A PT Short Term Goal 3 (Week 2): Pt will propel manual w/c x 150 ft at Supervision level  Skilled Therapeutic Interventions/Progress Updates:    Pt received seated in w/c in room, agreeable to PT session. No complaints of pain. Stand pivot transfer w/c to mat table with RW and mod A, max cueing due to visual deficits. Sit to stand x 5 reps with no AD and mod A. Pt exhibits posterior lean in standing as well as L trunk posterior rotation, unable to maintain balance and correct posture without max A. Sit to stand x 5 reps to RW with min to mod A, cueing for sequencing and hand placement. Pt exhibits improved trunk posture with use of RW. Standing alt L/R 4" step ups with RW and min A for balance for BLE strengthening, max cueing for sequencing and safe LE placement on step. Standing mini-squats 2 x 10 reps with RW and min A for balance. Sidesteps L/R 2 x 10 ft each direction with RW and min A for balance, max cueing for sequencing. Manual w/c propulsion x 150 ft with use of BUE and intermittent use of BLE, min A for directional steering and cues needed due to visual deficits. Pt requests to return to bed at end of session. Stand pivot transfer w/c to bed with RW and mod A. Pt requests to doff pants for improved ease of urinal use. Pt is min A to doff pants and shoes while seated EOB. Sit to supine CGA. Pt left semi-reclined in bed with needs in reach at end of session.  Therapy Documentation Precautions:  Precautions Precautions: Fall Precaution Comments: pt legally blind in both eyes Required Braces or  Orthoses: Cervical Brace Cervical Brace: Hard collar Restrictions Weight Bearing Restrictions: No    Therapy/Group: Individual Therapy   Peter Congo, PT, DPT  08/18/2019, 11:58 AM

## 2019-08-18 NOTE — Progress Notes (Signed)
Occupational Therapy Session Note  Patient Details  Name: Emmerson Taddei MRN: [id number] Date of Birth: [birth year]  Today's Date: 08/18/2019 OT Individual Time: 0700-0800 OT Individual Time Calculation (min): 60 min    Short Term Goals: Week 2:  OT Short Term Goal 1 (Week 2): Pt will be able to don a shirt with min A OT Short Term Goal 2 (Week 2): Pt will bathe his buttocks with mod A for standing OT Short Term Goal 3 (Week 2): Pt will complete LB dressing tasks with mod A OT Short Term Goal 4 (Week 2): Pt will complete toileting tasks with max A  Skilled Therapeutic Interventions/Progress Updates:    Pt resting in bed upon arrival.  Pt incontinent of bowel and bladder.  Initial focus on bed mobility,squat pivot transfer, and toileing tasks.  Supine>sit with min A.  Mod A for squat pivot transfer, and max A for toileting tasks. Pt transferred to w/c to completed bathing/dressing tasks.  See Care Tool. Sit<>stand from w/c for LB bathing/dressing with mod A to assist with posterior lean. Pt with improved LUE function/movement but requires assistance to complete tasks initiated with LUE. Pt remained seated in w/c with belt alarm activated.  Therapy Documentation Precautions:  Precautions Precautions: Fall Precaution Comments: pt legally blind in both eyes Required Braces or Orthoses: Cervical Brace Cervical Brace: Hard collar Restrictions Weight Bearing Restrictions: No  Pain:  Pt c/o L shoulder discomfort but states there is less pain   Therapy/Group: Individual Therapy  Rich Brave 08/18/2019, 8:17 AM

## 2019-08-18 NOTE — Progress Notes (Signed)
[facility] PHYSICAL MEDICINE & REHABILITATION PROGRESS NOTE   Subjective/Complaints:   Doing better and better- LBM just now-   Eating and sleeping OK.   ROS: patient denies SOB, CP, abd pain, vision changes (is blind) N/V/D/Constipation.   Objective:   No results found. Recent Labs    08/16/19 0517  WBC 7.0  HGB 11.4*  HCT 35.3*  PLT 256   Recent Labs    08/16/19 0517  NA 138  K 3.7  CL 102  CO2 26  GLUCOSE 91  BUN 13  CREATININE 0.72  CALCIUM 9.2    Intake/Output Summary (Last 24 hours) at 08/18/2019 0848 Last data filed at 08/18/2019 0515 Gross per 24 hour  Intake 480 ml  Output 1300 ml  Net -820 ml     Physical Exam: Vital Signs Blood pressure (!) 142/79, pulse 68, temperature 98.3 F (36.8 C), resp. rate 18, height 5\' 10"  (1.778 m), weight 76.1 kg, SpO2 100 %.  Constitutional: He appears well-developed and well-nourished. Sitting in [place], working with OT again, wearing dark sunglasses; lidoderm on L shoulder; NAD  HENT:  Head: Normocephalic and atraumatic.  Eyes: wearing sunglasses   Neck:  ACDF incision site clean and dry with dry dressing- incision looks great- glued, not stapled; wearing cervical collar Decreased cervical rotation; tight trigger points in L upper traps, levators and scalenes as well as rhomboids- more on L side. TTP down to distal biceps on L arm Cardiovascular: RRR  Respiratory: CTA B/L no W/R/R  GI: Soft, NT, ND, hypoactive BS again; no signs of distension Musculoskeletal:   Neck supple. No swelling anymore of LUE- nml size/swelling of RUE/hand  Comments: RUE- bicep 4/5, WE 4/5, triceps 4+/5, grip 4-/5, finger 4+/5 LUE- Biceps 3/5, WE 2+/5, triceps 3/5, grip 3/5, finger 3/5. No tenderness over left shoulder joint. RLE- HF 4-/5, KE 4/5, DF 4=/5, PF 4-/5 LLE- HF 2/5, KE 2/5, DF 2/5, PF 2-/5 Neurological:  Hoffman's LUE- strong; (-) Hoffman's in RUE;  no clonus in LEs B/L Sensation intact at C3 B/L; otherwise decreased at every  dermatome to L3 B/L ; L4-S2 dermatomes B/L very decreased sensation- same S3-S5 B/L Psychiatric:  Very appropriate and pleasant    Assessment/Plan: 1. Functional deficits secondary to C3 ASIA C/D quadriplegia  which require 3+ hours per day of interdisciplinary therapy in a comprehensive inpatient rehab setting.  Physiatrist is providing close team supervision and 24 hour management of active medical problems listed below.  Physiatrist and rehab team continue to assess barriers to discharge/monitor patient progress toward functional and medical goals  Care Tool:  Bathing    Body parts bathed by patient: Left arm, Chest, Abdomen, Front perineal area, Right upper leg, Left upper leg, Right lower leg, Face   Body parts bathed by helper: Right arm, Buttocks, Left lower leg, Right lower leg, Face     Bathing assist Assist Level: Moderate Assistance - Patient 50 - 74%     Upper Body Dressing/Undressing Upper body dressing   What is the patient wearing?: Pull over shirt    Upper body assist Assist Level: Moderate Assistance - Patient 50 - 74%    Lower Body Dressing/Undressing Lower body dressing      What is the patient wearing?: Underwear/pull up, Pants     Lower body assist Assist for lower body dressing: Maximal Assistance - Patient 25 - 49%     Toileting Toileting    Toileting assist Assist for toileting: Maximal Assistance - Patient 25 - 49%  Transfers Chair/bed transfer  Transfers assist     Chair/bed transfer assist level: Moderate Assistance - Patient 50 - 74%     Locomotion Ambulation   Ambulation assist   Ambulation activity did not occur: Safety/medical concerns  Assist level: Moderate Assistance - Patient 50 - 74% Assistive device: Walker-rolling Max distance: 60'   Walk 10 feet activity   Assist  Walk 10 feet activity did not occur: Safety/medical concerns  Assist level: Moderate Assistance - Patient - 50 - 74% Assistive device:  Walker-rolling   Walk 50 feet activity   Assist Walk 50 feet with 2 turns activity did not occur: Safety/medical concerns  Assist level: Moderate Assistance - Patient - 50 - 74% Assistive device: Walker-rolling    Walk 150 feet activity   Assist Walk 150 feet activity did not occur: Safety/medical concerns         Walk 10 feet on uneven surface  activity   Assist Walk 10 feet on uneven surfaces activity did not occur: Safety/medical concerns         Wheelchair     Assist Will patient use wheelchair at discharge?: Yes Type of Wheelchair: Manual Wheelchair activity did not occur: Safety/medical concerns  Wheelchair assist level: Minimal Assistance - Patient > 75% Max wheelchair distance: 150'    Wheelchair 50 feet with 2 turns activity    Assist    Wheelchair 50 feet with 2 turns activity did not occur: Safety/medical concerns   Assist Level: Minimal Assistance - Patient > 75%   Wheelchair 150 feet activity     Assist  Wheelchair 150 feet activity did not occur: Safety/medical concerns   Assist Level: Minimal Assistance - Patient > 75%   Blood pressure (!) 142/79, pulse 68, temperature 98.3 F (36.8 C), resp. rate 18, height 5\' 10"  (1.778 m), weight 76.1 kg, SpO2 100 %.    Medical Problem List and Plan:  1. Bilateral upper and lower extremity weakness with paresthesias left greater than right with gait disturbance secondary to cervical myelopathy.S/P multilevel discectomy C3-4, 4-5, 5-6 and C6-7 with decompression of spinal cord and exiting nerve roots with arthrodesis 08/05/2019 per Dr. 08/07/2019. HARD Cervical collar when out of bed.  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  -patient may Shower if covers neck incision  -ELOS/Goals: Supervision to Min assist - 3-3.5 weeks due to SCI   2/1- will remove honeycomb dressing and place daily dry dressing   2. Antithrombotics:  -DVT/anticoagulation: SCDs. Discuss  Lovenox for DVT prophylaxis with neurosurgery. Check vascular study   1/27- will start lovenox due to increased risk of DVT -antiplatelet therapy: Aspirin 81 mg to begin 08/12/2019  3. Pain Management: Post op pain, nerve pain and spasticity- Hydrocodone/Robaxin as needed  -will try and change to Baclofen for muscle spasms/spasticity once gets to CIR and possibly add Duloxetine for nerve pain   1/27- added baclofen 5 mg TID for spasticity and Duloxetine 30 mg QHS for nerve pain  1/28- add Lidoderm patches to L neck during day. 12 hrs on;12 hrs off  1/29- will increase Cymbalta on Monday. Lidoderm effective for L shoulder pain.   1/30: pain is well controlled  1/31: Complains of some pain over left shoulder. Lidocaine patch on. Not tender to palpation. Likely secondary to scapular destablization given left upper extremity hemiparesis. Ordered diclofenac gel 4 times daily prn for pain.   2/1- will increase Cymbalta to 60 mg daily.  4. Mood: Provide emotional support  -antipsychotic agents: N/A  5. Neuropsych: This patient is capable of making decisions on his own behalf.  6. Skin/Wound Care: Routine skin checks  7. Fluids/Electrolytes/Nutrition: Routine in and outs with follow-up chemistries  8. Hypertension. Toprol-XL 25 mg daily, Lotensin 10 mg daily, Norvasc 5 mg daily. Monitor with increased mobility   1/30: Uncontrolled on last several reads. Will increase Norvasc to 10mg  daily.   1/31: Reviewed flowsheet. Elevated this morning. Within normal limits in afternoon and evening. Continue to monitor.  9. Diabetes mellitus. Hemoglobin A1c 6.3. Glucophage 500 mg daily. Check blood sugars before meals and at bedtime   CBG (last 3)  Recent Labs    08/17/19 1637 08/17/19 2117 08/18/19 0614  GLUCAP 100* 157* 96    2/2-- BGs well controlled - 99-129  2/3- BGs 70-80- with 1 of 225- won't increase regimen anymore.  2/4- BG 96-157- con't regimen  10. Glaucoma/legally blind. Continue eyedrops as  directed- continue pt's dark sunglasses for light sensitivity.  11. Hyperlipidemia. Lipitor  12. Constipation in setting of Neurogenic bowel. Adjust bowel program as needed.  1/28- LBM early AM 1/27  2/2- LBM Saturday- 4 days- will give Mg citrate and follow with suppository if needed.   2/3- had good sized BM yesterday without mg citrate.   2/4- LBM this AM 13. Neurogenic bladder- will check PVRS to make sure is emptying since needed a in/out cath due to lack of voiding 2 days ago.  1/27- ordered PVRs  1/29- added Flomax 0.4mg  qsupper since doesn't feel like emptying.   2/2- dribbling- will monitor  14/ Hypokalemia  1/27- K+ 3.4- will replete  1/28- labs in AM  1/29- K+ 3.8  2/1- check labs in AM  2/2- K+ 3.7  LOS: 9 days A FACE TO FACE EVALUATION WAS PERFORMED  Brian Daniels 08/18/2019, 8:48 AM

## 2019-08-18 NOTE — Progress Notes (Signed)
Physical Therapy Session Note  Patient Details  Name: Brian Daniels MRN: [id number] Date of Birth: Jun 12, 1956  Today's Date: 08/18/2019 PT Individual Time: 1400-1500 PT Individual Time Calculation (min): 60 min   Short Term Goals: Week 2:  PT Short Term Goal 1 (Week 2): Pt will complete transfers with min A consistently PT Short Term Goal 2 (Week 2): Pt will ambulate x 75 ft with LRAD and min A PT Short Term Goal 3 (Week 2): Pt will propel manual w/c x 150 ft at Supervision level  Skilled Therapeutic Interventions/Progress Updates:    Session focused on functional transfers with RW, gait training with RW and L hand orthosis, and Nustep for NMR reciprocal movement re-training and functional strengthening (x 7 min on level 6 with BUE/BLE without need for adaptive hand piece at this point due to increased grip strength on LUE). Pt able to come to EOB with HOB elevated with CGA. Total assist to don cervical collar and shoes. Pt reports need to attempt to go to the bathroom. Performed toilet transfer with RW with overall mod assist for sit <> stand and mod assist for initial pivot. Requires max assist for clothing management and maintains balance with min assist overall. Pt unable to void. Transferred back to the bed with min assist with RW. Donned pants and min assist stand pivot with RW to w/c. Pt with initial difficulty for anterior weightshift and requires several attempts for sit <> stand but throughout session progressed to min assist overall with improved technique. NMR during gait x 15' x 2 reps with focus on gait pattern and overall balance and postural control demonstrating overall significant improvement at min assist level and more normalized BOS. Requires cues for direction due to visual impairments. Blocked practice sit <> stand retraining for NMR during transitional movements and focus on anterior weightshift and hip/trunk extension to promote upright posture. End of session performed  ambulatory transfer with RW and L hand orthosis z 6' to bed including sidestepping with overall min assist. Requires min assist for sit to supine for LLE management. All needs in reach.   Therapy Documentation Precautions:  Precautions Precautions: Fall Precaution Comments: pt legally blind in both eyes Required Braces or Orthoses: Cervical Brace Cervical Brace: Hard collar Restrictions Weight Bearing Restrictions: No  Pain:  Denies pain.      Therapy/Group: Individual Therapy  [name] [name], PT, DPT, CBIS  08/18/2019, 3:25 PM

## 2019-08-19 ENCOUNTER — Inpatient Hospital Stay (HOSPITAL_COMMUNITY): Payer: Medicare Other

## 2019-08-19 ENCOUNTER — Inpatient Hospital Stay (HOSPITAL_COMMUNITY): Payer: Medicare Other | Admitting: Physical Therapy

## 2019-08-19 LAB — GLUCOSE, CAPILLARY
Glucose-Capillary: 94 mg/dL (ref 70–99)
Glucose-Capillary: 99 mg/dL (ref 70–99)
Glucose-Capillary: 110 mg/dL — ABNORMAL HIGH (ref 70–99)
Glucose-Capillary: 169 mg/dL — ABNORMAL HIGH (ref 70–99)

## 2019-08-19 NOTE — Progress Notes (Signed)
Physical Therapy Session Note  Patient Details  Name: Brian Daniels MRN: [id number] Date of Birth: [birth year]  Today's Date: 08/19/2019 PT Individual Time: 1100-1200 PT Individual Time Calculation (min): 60 min   Short Term Goals: Week 2:  PT Short Term Goal 1 (Week 2): Pt will complete transfers with min A consistently PT Short Term Goal 2 (Week 2): Pt will ambulate x 75 ft with LRAD and min A PT Short Term Goal 3 (Week 2): Pt will propel manual w/c x 150 ft at Supervision level  Skilled Therapeutic Interventions/Progress Updates:    Pt received seated in w/c in room, agreeable to PT session. Pt reports some soreness in L shoulder region, not rated and declines intervention. Sit to stand with min A to RW throughout session, improved from mod A in previous session. Ambulation 1 x 50 ft, 2 x 100 ft with RW and min A overall for balance. Pt does exhibit ongoing path deviation to the R and narrow BOS with scissoring of RLE but is able to correct with mod v/c. Ascend/descend one 4" curb step with RW and mod A for balance in order to simulate entrance to patient's home, max cueing for sequencing and safety. Pt is able to perform curb step x 4 reps. Pt exhibits improved overall balance and increased independence with transfers this date. Pt left seated in w/c in room with needs in reach, quick release belt and chair alarm in place at end of session.  Therapy Documentation Precautions:  Precautions Precautions: Fall Precaution Comments: pt legally blind in both eyes Required Braces or Orthoses: Cervical Brace Cervical Brace: Hard collar Restrictions Weight Bearing Restrictions: No    Therapy/Group: Individual Therapy   Peter Congo, PT, DPT  08/19/2019, 12:46 PM

## 2019-08-19 NOTE — Progress Notes (Signed)
[facility] PHYSICAL MEDICINE & REHABILITATION PROGRESS NOTE   Subjective/Complaints:   Pt reports his L shoulder is a little better; Hands still numb; LBM last night- still needs to go this AM.   ROS: patient denies SOB, CP, abd pain, vision changes (is blind) N/V/D/Constipation.   Objective:   No results found. No results for input(s): WBC, HGB, HCT, PLT in the last 72 hours. No results for input(s): NA, K, CL, CO2, GLUCOSE, BUN, CREATININE, CALCIUM in the last 72 hours.  Intake/Output Summary (Last 24 hours) at 08/19/2019 0828 Last data filed at 08/19/2019 0600 Gross per 24 hour  Intake 600 ml  Output 750 ml  Net -150 ml     Physical Exam: Vital Signs Blood pressure (!) 143/79, pulse 64, temperature 97.9 F (36.6 C), resp. rate 17, height 5\' 10"  (1.778 m), weight 76.1 kg, SpO2 100 %.  Constitutional: awake, alert, appropriate; sitting up in manual w/c, wearing dark sunglasses; OT at bedside;  NAD  HENT:  Head: Normocephalic and atraumatic.  Eyes: wearing sunglasses   Neck:  ACDF incision site clean and dry with dry dressing- incision looks great- glued, not stapled; wearing cervical collar Decreased cervical rotation; tight trigger points in L upper traps, levators and scalenes as well as rhomboids- more on L side. TTP down to distal biceps on L arm Cardiovascular: RRR  Respiratory: CTA B/L no W/R/R  GI: Soft, NT, ND, hypoactive BS again; no signs of distension Musculoskeletal:   Neck supple. No swelling anymore of LUE- nml size/swelling of RUE/hand  Comments: RUE- bicep 4/5, WE 4/5, triceps 4+/5, grip 4-/5, finger 4+/5 LUE- Biceps 3/5, WE 2+/5, triceps 3/5, grip 3/5, finger 3/5. No tenderness over left shoulder joint. RLE- HF 4-/5, KE 4/5, DF 4=/5, PF 4-/5 LLE- HF 2/5, KE 2/5, DF 2/5, PF 2-/5 Neurological:  Hoffman's LUE- strong; (-) Hoffman's in RUE;  no clonus in LEs B/L Sensation intact at C3 B/L; otherwise decreased at every dermatome to L3 B/L ; L4-S2 dermatomes  B/L very decreased sensation- same S3-S5 B/L Psychiatric:  Very appropriate and pleasant    Assessment/Plan: 1. Functional deficits secondary to C3 ASIA C/D quadriplegia  which require 3+ hours per day of interdisciplinary therapy in a comprehensive inpatient rehab setting.  Physiatrist is providing close team supervision and 24 hour management of active medical problems listed below.  Physiatrist and rehab team continue to assess barriers to discharge/monitor patient progress toward functional and medical goals  Care Tool:  Bathing    Body parts bathed by patient: Left arm, Chest, Abdomen, Front perineal area, Right upper leg, Left upper leg, Right lower leg, Face   Body parts bathed by helper: Right arm, Buttocks, Left lower leg, Right lower leg, Face     Bathing assist Assist Level: Moderate Assistance - Patient 50 - 74%     Upper Body Dressing/Undressing Upper body dressing   What is the patient wearing?: Pull over shirt    Upper body assist Assist Level: Moderate Assistance - Patient 50 - 74%    Lower Body Dressing/Undressing Lower body dressing      What is the patient wearing?: Underwear/pull up, Pants     Lower body assist Assist for lower body dressing: Maximal Assistance - Patient 25 - 49%     Toileting Toileting    Toileting assist Assist for toileting: Maximal Assistance - Patient 25 - 49%     Transfers Chair/bed transfer  Transfers assist     Chair/bed transfer assist level: Minimal Assistance -  Patient > 75%     Locomotion Ambulation   Ambulation assist   Ambulation activity did not occur: Safety/medical concerns  Assist level: Minimal Assistance - Patient > 75% Assistive device: Walker-rolling Max distance: 15'   Walk 10 feet activity   Assist  Walk 10 feet activity did not occur: Safety/medical concerns  Assist level: Minimal Assistance - Patient > 75% Assistive device: Walker-rolling, Orthosis   Walk 50 feet  activity   Assist Walk 50 feet with 2 turns activity did not occur: Safety/medical concerns  Assist level: Moderate Assistance - Patient - 50 - 74% Assistive device: Walker-rolling    Walk 150 feet activity   Assist Walk 150 feet activity did not occur: Safety/medical concerns         Walk 10 feet on uneven surface  activity   Assist Walk 10 feet on uneven surfaces activity did not occur: Safety/medical concerns         Wheelchair     Assist Will patient use wheelchair at discharge?: Yes Type of Wheelchair: Manual Wheelchair activity did not occur: Safety/medical concerns  Wheelchair assist level: Minimal Assistance - Patient > 75% Max wheelchair distance: 150'    Wheelchair 50 feet with 2 turns activity    Assist    Wheelchair 50 feet with 2 turns activity did not occur: Safety/medical concerns   Assist Level: Minimal Assistance - Patient > 75%   Wheelchair 150 feet activity     Assist  Wheelchair 150 feet activity did not occur: Safety/medical concerns   Assist Level: Minimal Assistance - Patient > 75%   Blood pressure (!) 143/79, pulse 64, temperature 97.9 F (36.6 C), resp. rate 17, height 5\' 10"  (1.778 m), weight 76.1 kg, SpO2 100 %.    Medical Problem List and Plan:  1. Bilateral upper and lower extremity weakness with paresthesias left greater than right with gait disturbance secondary to cervical myelopathy.S/P multilevel discectomy C3-4, 4-5, 5-6 and C6-7 with decompression of spinal cord and exiting nerve roots with arthrodesis 08/05/2019 per Dr. Kathyrn Sheriff. HARD Cervical collar when out of bed.  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  -patient may Shower if covers neck incision  -ELOS/Goals: Supervision to Min assist - 3-3.5 weeks due to SCI   2/1- will remove honeycomb dressing and place daily dry dressing  2/5- looking good, incision wise 2. Antithrombotics:  -DVT/anticoagulation: SCDs. Discuss  Lovenox for DVT prophylaxis with neurosurgery. Check vascular study   1/27- will start lovenox due to increased risk of DVT -antiplatelet therapy: Aspirin 81 mg to begin 08/12/2019  3. Pain Management: Post op pain, nerve pain and spasticity- Hydrocodone/Robaxin as needed  -will try and change to Baclofen for muscle spasms/spasticity once gets to CIR and possibly add Duloxetine for nerve pain   1/27- added baclofen 5 mg TID for spasticity and Duloxetine 30 mg QHS for nerve pain  1/28- add Lidoderm patches to L neck during day. 12 hrs on;12 hrs off  1/29- will increase Cymbalta on Monday. Lidoderm effective for L shoulder pain.   1/30: pain is well controlled  1/31: Complains of some pain over left shoulder. Lidocaine patch on. Not tender to palpation. Likely secondary to scapular destablization given left upper extremity hemiparesis. Ordered diclofenac gel 4 times daily prn for pain.   2/1- will increase Cymbalta to 60 mg daily.  4. Mood: Provide emotional support  -antipsychotic agents: N/A  5. Neuropsych: This patient is capable of making decisions on his own behalf.  6.  Skin/Wound Care: Routine skin checks  7. Fluids/Electrolytes/Nutrition: Routine in and outs with follow-up chemistries  8. Hypertension. Toprol-XL 25 mg daily, Lotensin 10 mg daily, Norvasc 5 mg daily. Monitor with increased mobility   1/30: Uncontrolled on last several reads. Will increase Norvasc to 10mg  daily.   1/31: Reviewed flowsheet. Elevated this morning. Within normal limits in afternoon and evening. Continue to monitor.  9. Diabetes mellitus. Hemoglobin A1c 6.3. Glucophage 500 mg daily. Check blood sugars before meals and at bedtime   CBG (last 3)  Recent Labs    08/18/19 1646 08/18/19 2106 08/19/19 0606  GLUCAP 85 113* 94    2/2-- BGs well controlled - 99-129  2/3- BGs 70-80- with 1 of 225- won't increase regimen anymore.  2/4- BG 96-157- con't regimen  10. Glaucoma/legally blind. Continue eyedrops as  directed- continue pt's dark sunglasses for light sensitivity.  11. Hyperlipidemia. Lipitor  12. Constipation in setting of Neurogenic bowel. Adjust bowel program as needed.  1/28- LBM early AM 1/27  2/2- LBM Saturday- 4 days- will give Mg citrate and follow with suppository if needed.   2/3- had good sized BM yesterday without mg citrate.   2/4- LBM this AM 13. Neurogenic bladder- will check PVRS to make sure is emptying since needed a in/out cath due to lack of voiding 2 days ago.  1/27- ordered PVRs  1/29- added Flomax 0.4mg  qsupper since doesn't feel like emptying.   2/2- dribbling- will monitor  14/ Hypokalemia  1/27- K+ 3.4- will replete  1/28- labs in AM  1/29- K+ 3.8  2/1- check labs in AM  2/2- K+ 3.7  LOS: 10 days A FACE TO FACE EVALUATION WAS PERFORMED  Suzanna Zahn 08/19/2019, 8:28 AM

## 2019-08-19 NOTE — Progress Notes (Signed)
Physical Therapy Session Note  Patient Details  Name: Brian Daniels MRN: [id number] Date of Birth: 04-26-1956  Today's Date: 08/19/2019 PT Individual Time: 8338-2505 PT Individual Time Calculation (min): 73 min   Short Term Goals: Week 1:  PT Short Term Goal 1 (Week 1): Pt will complete bed mobility with assist x 1 consistently PT Short Term Goal 1 - Progress (Week 1): Met PT Short Term Goal 2 (Week 1): Pt will perform least restrictive transfer with assist x 1 consistently PT Short Term Goal 2 - Progress (Week 1): Met PT Short Term Goal 3 (Week 1): Pt will initiate gait training as safe and able PT Short Term Goal 3 - Progress (Week 1): Met Week 2:  PT Short Term Goal 1 (Week 2): Pt will complete transfers with min A consistently PT Short Term Goal 2 (Week 2): Pt will ambulate x 75 ft with LRAD and min A PT Short Term Goal 3 (Week 2): Pt will propel manual w/c x 150 ft at Supervision level  Skilled Therapeutic Interventions/Progress Updates:    NMR during gait training with RW and L hand orthosis with focus on postural control, BLE coordination and control, increasing hip and trunk extension, and wider BOS (RLE still scissoring at times) x 55' x 2 trials with min to mod assist and assist for steering of RW at times during turning and obstacle negotiation due to visual impairments. Pt with tendency to veer to the R.   Standing therex for functional strengthening to aid with overall transfers including standing hip abduction, heel raises, toe raises, and hamstring curls x 10 reps x 2 sets with seated rest break between sets.  Seated UE strengthening with 1# dowel rod including bicep curls with hand pronated and supinated and scapular rows x 10 reps each x 2 sets.  Educated on ways to progress exercises to emphasize focus on balance vs strength. Will follow up with appropriate HEP closer to d/c.  NMR for balance retraining on compliant surface with minimal BUE support to increase challenge of  balance with normal BOS and then with narrow BOS x 2 trials each with seated rest break to focus on activating ankle and hip strategies and maintaining postural control. Pt requires overall min assist. Educated on how this related to BOS during gait and how narrow BOS (when RLE scissors) increases fall risk due to impaired balance. Pt verbalizes understanding and appreciative of the correlation. Very receptive of all feedback. Transitional movement for sit <> Stands today with overall min assist and improved anterior weightshift for technique.  End of session pt transfers back to bed with ambulatory transfer with overall min assist with RW and cues due to visual impairment. Requires increased assist for sit to supine due to increased neck pain and decreased ability to prop on L elbow. Min assist overall. Positioned with all needs in reach.   Therapy Documentation Precautions:  Precautions Precautions: Fall Precaution Comments: pt legally blind in both eyes Required Braces or Orthoses: Cervical Brace Cervical Brace: Hard collar Restrictions Weight Bearing Restrictions: No  Pain:  Reports premedicated for pain in L shoulder.    Therapy/Group: Individual Therapy  Canary Brim Ivory Broad, PT, DPT, CBIS  08/19/2019, 3:01 PM

## 2019-08-19 NOTE — Progress Notes (Signed)
Occupational Therapy Session Note  Patient Details  Name: Brian Daniels MRN: [id number] Date of Birth: Dec 01, 1955  Today's Date: 08/19/2019 OT Individual Time: 0700-0800 OT Individual Time Calculation (min): 60 min    Short Term Goals: Week 2:  OT Short Term Goal 1 (Week 2): Pt will be able to don a shirt with min A OT Short Term Goal 2 (Week 2): Pt will bathe his buttocks with mod A for standing OT Short Term Goal 3 (Week 2): Pt will complete LB dressing tasks with mod A OT Short Term Goal 4 (Week 2): Pt will complete toileting tasks with max A  Skilled Therapeutic Interventions/Progress Updates:    Pt resting in bed upon arrival and requesting use of BSC. Supine>sit EOB with supervision using bed rails. Squat pivot tranfser to [name] with min A.  Pt unsuccessful using BSC and performed squat pivot tranfser to w/c to complete bathing/dressing tasks with sit<>stand from w/c.  See Care Tool for assist levels.  Sit<>stand with RW at min A and standing balance with min A/CGA. Pt requires RUE support for standing balance and requires increased assistance for clothing management when standing.  Pt able to apply deodorant to R axilla with LUE-assistance required for thoroughness.  Pt pleased with improved functional use/movement of LUE. Pt attempted to thread RLE into pants without success.  Pt unable to achieve figure 4 position to thread pants.  Will continue to address. Pt remained seated in w/c with all needs within reach.  RN attending.  Belt alarm activated.   Therapy Documentation Precautions:  Precautions Precautions: Fall Precaution Comments: pt legally blind in both eyes Required Braces or Orthoses: Cervical Brace Cervical Brace: Hard collar Restrictions Weight Bearing Restrictions: No  Pain:  Pt c/o L shoulder discomfort but improving; activity, MD aware   Therapy/Group: Individual Therapy  Rich Brave 08/19/2019, 8:58 AM

## 2019-08-20 ENCOUNTER — Inpatient Hospital Stay (HOSPITAL_COMMUNITY): Payer: Medicare Other | Admitting: Physical Therapy

## 2019-08-20 LAB — GLUCOSE, CAPILLARY
Glucose-Capillary: 102 mg/dL — ABNORMAL HIGH (ref 70–99)
Glucose-Capillary: 114 mg/dL — ABNORMAL HIGH (ref 70–99)
Glucose-Capillary: 100 mg/dL — ABNORMAL HIGH (ref 70–99)
Glucose-Capillary: 115 mg/dL — ABNORMAL HIGH (ref 70–99)

## 2019-08-20 NOTE — Progress Notes (Signed)
[place] PHYSICAL MEDICINE & REHABILITATION PROGRESS NOTE   Subjective/Complaints:   Pt reports stools are really loose- on Colace and Senokot 2 tabs BID- but had 3 BMs that were loose overnight-   R hand numbness significantly improved- L hand is lagging behind but also doing better.    ROS: patient denies SOB, CP, abd pain, vision changes (is blind) N/V/D/Constipation.   Objective:   No results found. No results for input(s): WBC, HGB, HCT, PLT in the last 72 hours. No results for input(s): NA, K, CL, CO2, GLUCOSE, BUN, CREATININE, CALCIUM in the last 72 hours.  Intake/Output Summary (Last 24 hours) at 08/20/2019 1121 Last data filed at 08/20/2019 0730 Gross per 24 hour  Intake 462 ml  Output 1825 ml  Net -1363 ml     Physical Exam: Vital Signs Blood pressure (!) 141/78, pulse 68, temperature 98.1 F (36.7 C), resp. rate 19, height 5\' 10"  (1.778 m), weight 76.1 kg, SpO2 100 %.  Constitutional: awake, alert, appropriate; sitting up in bed; wearing dark sunglasses; listening to TV;  NAD  HENT:  Head: Normocephalic and atraumatic.  Eyes: wearing sunglasses   Neck:  ACDF incision site clean and dry with dry dressing- incision looks great- glued, not stapled; not wearing cervical collar since in bed Decreased cervical rotation; less tight trigger points in L upper traps, levators and scalenes as well as rhomboids- more on L side. TTP down to distal biceps on L arm Cardiovascular: RRR  Respiratory: CTA B/L no W/R/R  GI; soft, NT, ND, (+)BS-  Musculoskeletal:   Neck supple. No swelling anymore of LUE- nml size/swelling of RUE/hand  Comments: RUE- bicep 4/5, WE 4/5, triceps 4+/5, grip 4-/5, finger 4+/5 LUE- Biceps 3/5, WE 2+/5, triceps 3/5, grip 3/5, finger 3/5. No tenderness over left shoulder joint. RLE- HF 4-/5, KE 4/5, DF 4+/5, PF 4-/5 LLE- HF 2/5, KE 2/5, DF 2/5, PF 2-/5 Neurological:  Hoffman's LUE- strong; (-) Hoffman's in RUE;  no clonus in LEs B/L Sensation intact  at C3 B/L; otherwise decreased at every dermatome to L3 B/L ; L4-S2 dermatomes B/L very decreased sensation- same S3-S5 B/L Psychiatric:  Very appropriate and pleasant    Assessment/Plan: 1. Functional deficits secondary to C3 ASIA C/D quadriplegia  which require 3+ hours per day of interdisciplinary therapy in a comprehensive inpatient rehab setting.  Physiatrist is providing close team supervision and 24 hour management of active medical problems listed below.  Physiatrist and rehab team continue to assess barriers to discharge/monitor patient progress toward functional and medical goals  Care Tool:  Bathing    Body parts bathed by patient: Left arm, Chest, Abdomen, Front perineal area, Right upper leg, Left upper leg, Right lower leg   Body parts bathed by helper: Right arm, Buttocks, Left lower leg, Right lower leg, Face     Bathing assist Assist Level: Minimal Assistance - Patient > 75%     Upper Body Dressing/Undressing Upper body dressing   What is the patient wearing?: Pull over shirt    Upper body assist Assist Level: Moderate Assistance - Patient 50 - 74%    Lower Body Dressing/Undressing Lower body dressing      What is the patient wearing?: Incontinence brief, Pants     Lower body assist Assist for lower body dressing: Maximal Assistance - Patient 25 - 49%     Toileting Toileting    Toileting assist Assist for toileting: Maximal Assistance - Patient 25 - 49%     Transfers Chair/bed  transfer  Transfers assist     Chair/bed transfer assist level: Minimal Assistance - Patient > 75%     Locomotion Ambulation   Ambulation assist   Ambulation activity did not occur: Safety/medical concerns  Assist level: Minimal Assistance - Patient > 75% Assistive device: Walker-rolling Max distance: 55'   Walk 10 feet activity   Assist  Walk 10 feet activity did not occur: Safety/medical concerns  Assist level: Minimal Assistance - Patient >  75% Assistive device: Walker-rolling, Orthosis   Walk 50 feet activity   Assist Walk 50 feet with 2 turns activity did not occur: Safety/medical concerns  Assist level: Moderate Assistance - Patient - 50 - 74% Assistive device: Walker-rolling, Orthosis    Walk 150 feet activity   Assist Walk 150 feet activity did not occur: Safety/medical concerns         Walk 10 feet on uneven surface  activity   Assist Walk 10 feet on uneven surfaces activity did not occur: Safety/medical concerns         Wheelchair     Assist Will patient use wheelchair at discharge?: Yes Type of Wheelchair: Manual Wheelchair activity did not occur: Safety/medical concerns  Wheelchair assist level: Minimal Assistance - Patient > 75% Max wheelchair distance: 150'    Wheelchair 50 feet with 2 turns activity    Assist    Wheelchair 50 feet with 2 turns activity did not occur: Safety/medical concerns   Assist Level: Minimal Assistance - Patient > 75%   Wheelchair 150 feet activity     Assist  Wheelchair 150 feet activity did not occur: Safety/medical concerns   Assist Level: Minimal Assistance - Patient > 75%   Blood pressure (!) 141/78, pulse 68, temperature 98.1 F (36.7 C), resp. rate 19, height 5\' 10"  (1.778 m), weight 76.1 kg, SpO2 100 %.    Medical Problem List and Plan:  1. Bilateral upper and lower extremity weakness with paresthesias left greater than right with gait disturbance secondary to cervical myelopathy.S/P multilevel discectomy C3-4, 4-5, 5-6 and C6-7 with decompression of spinal cord and exiting nerve roots with arthrodesis 08/05/2019 per Dr. 08/07/2019. HARD Cervical collar when out of bed.  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  -patient may Shower if covers neck incision  -ELOS/Goals: Supervision to Min assist - 3-3.5 weeks due to SCI   2/1- will remove honeycomb dressing and place daily dry dressing  2/5- looking good,  incision wise  2/6- making sensory and moror gains basically daily.  2. Antithrombotics:  -DVT/anticoagulation: SCDs. Discuss Lovenox for DVT prophylaxis with neurosurgery. Check vascular study   1/27- will start lovenox due to increased risk of DVT -antiplatelet therapy: Aspirin 81 mg to begin 08/12/2019  3. Pain Management: Post op pain, nerve pain and spasticity- Hydrocodone/Robaxin as needed  -will try and change to Baclofen for muscle spasms/spasticity once gets to CIR and possibly add Duloxetine for nerve pain   1/27- added baclofen 5 mg TID for spasticity and Duloxetine 30 mg QHS for nerve pain  1/28- add Lidoderm patches to L neck during day. 12 hrs on;12 hrs off  1/29- will increase Cymbalta on Monday. Lidoderm effective for L shoulder pain.   1/30: pain is well controlled  1/31: Complains of some pain over left shoulder. Lidocaine patch on. Not tender to palpation. Likely secondary to scapular destablization given left upper extremity hemiparesis. Ordered diclofenac gel 4 times daily prn for pain.   2/1- will increase Cymbalta to 60 mg daily.  4. Mood: Provide emotional support  -antipsychotic agents: N/A  5. Neuropsych: This patient is capable of making decisions on his own behalf.  6. Skin/Wound Care: Routine skin checks  7. Fluids/Electrolytes/Nutrition: Routine in and outs with follow-up chemistries  8. Hypertension. Toprol-XL 25 mg daily, Lotensin 10 mg daily, Norvasc 5 mg daily. Monitor with increased mobility   1/30: Uncontrolled on last several reads. Will increase Norvasc to 10mg  daily.   1/31: Reviewed flowsheet. Elevated this morning. Within normal limits in afternoon and evening. Continue to monitor.  9. Diabetes mellitus. Hemoglobin A1c 6.3. Glucophage 500 mg daily. Check blood sugars before meals and at bedtime   CBG (last 3)  Recent Labs    08/19/19 1641 08/19/19 2106 08/20/19 0556  GLUCAP 110* 169* 102*    2/2-- BGs well controlled - 99-129  2/3- BGs 70-80-  with 1 of 225- won't increase regimen anymore.  2/5- BGs 102- 169- con't regimen.   10. Glaucoma/legally blind. Continue eyedrops as directed- continue pt's dark sunglasses for light sensitivity.  11. Hyperlipidemia. Lipitor  12. Constipation in setting of Neurogenic bowel. Adjust bowel program as needed.  1/28- LBM early AM 1/27  2/2- LBM Saturday- 4 days- will give Mg citrate and follow with suppository if needed.   2/3- had good sized BM yesterday without mg citrate.   2/4- LBM this AM  2/6- stop Colace BID; con't Senokot for now and monitor for loose stools 13. Neurogenic bladder- will check PVRS to make sure is emptying since needed a in/out cath due to lack of voiding 2 days ago.  1/27- ordered PVRs  1/29- added Flomax 0.4mg  qsupper since doesn't feel like emptying.   2/2- dribbling- will monitor  2/6- no complaints currently  14/ Hypokalemia  1/27- K+ 3.4- will replete  1/28- labs in AM  1/29- K+ 3.8  2/1- check labs in AM  2/2- K+ 3.7  LOS: 11 days A FACE TO FACE EVALUATION WAS PERFORMED  Brian Daniels 08/20/2019, 11:21 AM

## 2019-08-20 NOTE — Progress Notes (Signed)
Physical Therapy Session Note  Patient Details  Name: Brian Daniels MRN: [id number] Date of Birth: 1956/02/25  Today's Date: 08/20/2019 PT Individual Time: 1400-1430 PT Individual Time Calculation (min): 30 min   Short Term Goals: Week 2:  PT Short Term Goal 1 (Week 2): Pt will complete transfers with min A consistently PT Short Term Goal 2 (Week 2): Pt will ambulate x 75 ft with LRAD and min A PT Short Term Goal 3 (Week 2): Pt will propel manual w/c x 150 ft at Supervision level  Skilled Therapeutic Interventions/Progress Updates:    Pt received seated in bed, agreeable to PT session. Pt reports some soreness in L shoulder this PM, using Lidocaine patch for pain relief. Pt reports urge to use the bathroom. Semi-reclined to seated EOB with CGA. Dependent to don hard collar while seated EOB. Sit to stand with min A to RW. Stand pivot transfer bed to [facility] with RW and min A, max cueing for sequencing due to visual deficits. Pt is max A for doffing pants and brief. Pt unable to void once seated on commode. Static standing balance with one UE support on RW (LUE) while performing pericare with RUE with min A for balance. Assisted pt with remaining pericare for thoroughness. Pt is max A for donning new brief and pulling up pants. Stand pivot transfer to w/c with min A and RW. Pt left seated in w/c in room with needs in reach, sister present at end of session.  Therapy Documentation Precautions:  Precautions Precautions: Fall Precaution Comments: pt legally blind in both eyes Required Braces or Orthoses: Cervical Brace Cervical Brace: Hard collar Restrictions Weight Bearing Restrictions: No    Therapy/Group: Individual Therapy   Peter Congo, PT, DPT  08/20/2019, 3:45 PM

## 2019-08-20 NOTE — Plan of Care (Signed)
Upgraded gait and wheelchair distance goals as well as assist level, upgraded stair goal assist level due to great progress

## 2019-08-21 ENCOUNTER — Inpatient Hospital Stay (HOSPITAL_COMMUNITY): Payer: Medicare Other

## 2019-08-21 LAB — GLUCOSE, CAPILLARY
Glucose-Capillary: 91 mg/dL (ref 70–99)
Glucose-Capillary: 93 mg/dL (ref 70–99)
Glucose-Capillary: 106 mg/dL — ABNORMAL HIGH (ref 70–99)
Glucose-Capillary: 124 mg/dL — ABNORMAL HIGH (ref 70–99)

## 2019-08-21 NOTE — Progress Notes (Signed)
Physical Therapy Session Note  Patient Details  Name: Brian Daniels MRN: [id number] Date of Birth: Sep 04, 1955  Today's Date: 08/21/2019 PT Individual Time: 1600-1659 PT Individual Time Calculation (min): 59 min   Short Term Goals: Week 2:  PT Short Term Goal 1 (Week 2): Pt will complete transfers with min A consistently PT Short Term Goal 2 (Week 2): Pt will ambulate x 75 ft with LRAD and min A PT Short Term Goal 3 (Week 2): Pt will propel manual w/c x 150 ft at Supervision level  Skilled Therapeutic Interventions/Progress Updates:    Pt supine in bed upon PT arrival, agreeable to therapy tx and reports cervical pain but does not rate today. Pt requesting to try using the bathroom. Pt transferred to sitting EOB with min assist, sitting EOB donned hard collar. Pt ambulated x 6 ft to the commode with RW and min assist, assist to help steer RW and max cues for directions with turn to sit. Pt unable to void, pt performed sit>stand with min assist and maintained standing balance with min assist while his sister assisted with lower body bathing and dressing. Pt ambulated x 5 ft to the w/c with RW and min assist, max cues for placement and sequencing. Pt seated in w/c while sister assisted with upper body bathing and therapist assisted with upper body dressing. Pt transported to the gym in w/c. Pt ambulated 2 x 40 ft this session with min-mod assist and cues to correct R lateral lean/drift, cues for sequencing and foot placement, assist for steering RW. Pt worked on static standing balance this session without UE support, focusing on weightshifting to minimize lean in either, pt commonly with R lateral or posterior lean, cues to correct these. In standing without UE support pt performed lateral weightshifting in place, min assist and min cues for L quad activation/knee extension. Pt performed x 10 mini squat without UE support with emphasis on postural control and correction of sway/LOB, cues for techniques  and weightshifting. Pt able to hold x 5 isometric squats, working on knee control and stability. Without UE support pt performed x 5 R LE lateral steps in place and x 3 L LE lateral steps in place, emphasis on postural control and maintaining balance, min assist. Pt transported back to room at end of session, left with needs in reach and chair alarm set, sister present.   Therapy Documentation Precautions:  Precautions Precautions: Fall Precaution Comments: pt legally blind in both eyes Required Braces or Orthoses: Cervical Brace Cervical Brace: Hard collar Restrictions Weight Bearing Restrictions: No    Therapy/Group: Individual Therapy  Cresenciano Genre, PT, DPT, CSRS 08/21/2019, 12:17 PM

## 2019-08-21 NOTE — Progress Notes (Signed)
[place] PHYSICAL MEDICINE & REHABILITATION PROGRESS NOTE   Subjective/Complaints:   Pt reports doing OK- stools are better- backed off Bowel meds yesterday and has been somewhat better.   Ordered labs for tomorrow   ROS: patient denies SOB, CP, abd pain, vision changes (is blind) N/V/D/Constipation.   Objective:   No results found. No results for input(s): WBC, HGB, HCT, PLT in the last 72 hours. No results for input(s): NA, K, CL, CO2, GLUCOSE, BUN, CREATININE, CALCIUM in the last 72 hours.  Intake/Output Summary (Last 24 hours) at 08/21/2019 1344 Last data filed at 08/21/2019 0941 Gross per 24 hour  Intake 230 ml  Output 800 ml  Net -570 ml     Physical Exam: Vital Signs Blood pressure (!) 146/83, pulse 67, temperature 98.1 F (36.7 C), resp. rate 19, height 5\' 10"  (1.778 m), weight 76.1 kg, SpO2 100 %.  Constitutional: awake, alert, appropriate; sitting up in bed; wearing dark sunglasses; getting help eating breakfast;  NAD  HENT:  Head: Normocephalic and atraumatic.  Eyes: wearing sunglasses   Neck:  ACDF incision site clean and dry with dry dressing- incision looks great- glued, not stapled; not wearing cervical collar since in bed Decreased cervical rotation; less tight trigger points in L upper traps, levators and scalenes as well as rhomboids- more on L side. TTP down to distal biceps on L arm Cardiovascular: RRR  Respiratory: CTA B/L no W/R/R  GI; soft, NT, ND, (+)BS-  Musculoskeletal:   Neck supple. No swelling anymore of LUE- nml size/swelling of RUE/hand  Comments: RUE- bicep 4/5, WE 4/5, triceps 4+/5, grip 4-/5, finger 4+/5 LUE- Biceps 3/5, WE 2+/5, triceps 3/5, grip 3/5, finger 3/5. No tenderness over left shoulder joint. RLE- HF 4-/5, KE 4/5, DF 4+/5, PF 4-/5 LLE- HF 2/5, KE 2/5, DF 2/5, PF 2-/5 Neurological:  Hoffman's LUE- strong; (-) Hoffman's in RUE;  no clonus in LEs B/L Sensation intact at C3 B/L; otherwise decreased at every dermatome to L3 B/L  ; L4-S2 dermatomes B/L very decreased sensation- same S3-S5 B/L Psychiatric:  Very appropriate and pleasant    Assessment/Plan: 1. Functional deficits secondary to C3 ASIA C/D quadriplegia  which require 3+ hours per day of interdisciplinary therapy in a comprehensive inpatient rehab setting.  Physiatrist is providing close team supervision and 24 hour management of active medical problems listed below.  Physiatrist and rehab team continue to assess barriers to discharge/monitor patient progress toward functional and medical goals  Care Tool:  Bathing    Body parts bathed by patient: Left arm, Chest, Abdomen, Front perineal area, Right upper leg, Left upper leg, Right lower leg   Body parts bathed by helper: Right arm, Buttocks, Left lower leg, Right lower leg, Face     Bathing assist Assist Level: Minimal Assistance - Patient > 75%     Upper Body Dressing/Undressing Upper body dressing   What is the patient wearing?: Pull over shirt    Upper body assist Assist Level: Moderate Assistance - Patient 50 - 74%    Lower Body Dressing/Undressing Lower body dressing      What is the patient wearing?: Incontinence brief, Pants     Lower body assist Assist for lower body dressing: Maximal Assistance - Patient 25 - 49%     Toileting Toileting    Toileting assist Assist for toileting: Maximal Assistance - Patient 25 - 49%     Transfers Chair/bed transfer  Transfers assist     Chair/bed transfer assist level: Minimal Assistance -  Patient > 75%     Locomotion Ambulation   Ambulation assist   Ambulation activity did not occur: Safety/medical concerns  Assist level: Minimal Assistance - Patient > 75% Assistive device: Walker-rolling Max distance: 55'   Walk 10 feet activity   Assist  Walk 10 feet activity did not occur: Safety/medical concerns  Assist level: Minimal Assistance - Patient > 75% Assistive device: Walker-rolling, Orthosis   Walk 50 feet  activity   Assist Walk 50 feet with 2 turns activity did not occur: Safety/medical concerns  Assist level: Moderate Assistance - Patient - 50 - 74% Assistive device: Walker-rolling, Orthosis    Walk 150 feet activity   Assist Walk 150 feet activity did not occur: Safety/medical concerns         Walk 10 feet on uneven surface  activity   Assist Walk 10 feet on uneven surfaces activity did not occur: Safety/medical concerns         Wheelchair     Assist Will patient use wheelchair at discharge?: Yes Type of Wheelchair: Manual Wheelchair activity did not occur: Safety/medical concerns  Wheelchair assist level: Minimal Assistance - Patient > 75% Max wheelchair distance: 150'    Wheelchair 50 feet with 2 turns activity    Assist    Wheelchair 50 feet with 2 turns activity did not occur: Safety/medical concerns   Assist Level: Minimal Assistance - Patient > 75%   Wheelchair 150 feet activity     Assist  Wheelchair 150 feet activity did not occur: Safety/medical concerns   Assist Level: Minimal Assistance - Patient > 75%   Blood pressure (!) 146/83, pulse 67, temperature 98.1 F (36.7 C), resp. rate 19, height 5\' 10"  (1.778 m), weight 76.1 kg, SpO2 100 %.    Medical Problem List and Plan:  1. Bilateral upper and lower extremity weakness with paresthesias left greater than right with gait disturbance secondary to cervical myelopathy.S/P multilevel discectomy C3-4, 4-5, 5-6 and C6-7 with decompression of spinal cord and exiting nerve roots with arthrodesis 08/05/2019 per Dr. 08/07/2019. HARD Cervical collar when out of bed.  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  -patient may Shower if covers neck incision  -ELOS/Goals: Supervision to Min assist - 3-3.5 weeks due to SCI   2/1- will remove honeycomb dressing and place daily dry dressing  2/5- looking good, incision wise  2/6- making sensory and moror gains basically  daily.   2/7- goals increased 2/6 due to great progress 2. Antithrombotics:  -DVT/anticoagulation: SCDs. Discuss Lovenox for DVT prophylaxis with neurosurgery. Check vascular study   1/27- will start lovenox due to increased risk of DVT -antiplatelet therapy: Aspirin 81 mg to begin 08/12/2019  3. Pain Management: Post op pain, nerve pain and spasticity- Hydrocodone/Robaxin as needed  -will try and change to Baclofen for muscle spasms/spasticity once gets to CIR and possibly add Duloxetine for nerve pain   1/27- added baclofen 5 mg TID for spasticity and Duloxetine 30 mg QHS for nerve pain  1/28- add Lidoderm patches to L neck during day. 12 hrs on;12 hrs off  1/29- will increase Cymbalta on Monday. Lidoderm effective for L shoulder pain.   1/30: pain is well controlled  1/31: Complains of some pain over left shoulder. Lidocaine patch on. Not tender to palpation. Likely secondary to scapular destablization given left upper extremity hemiparesis. Ordered diclofenac gel 4 times daily prn for pain.   2/1- will increase Cymbalta to 60 mg daily.  4. Mood: Provide emotional support  -  antipsychotic agents: N/A  5. Neuropsych: This patient is capable of making decisions on his own behalf.  6. Skin/Wound Care: Routine skin checks  7. Fluids/Electrolytes/Nutrition: Routine in and outs with follow-up chemistries  8. Hypertension. Toprol-XL 25 mg daily, Lotensin 10 mg daily, Norvasc 5 mg daily. Monitor with increased mobility   1/30: Uncontrolled on last several reads. Will increase Norvasc to 10mg  daily.   1/31: Reviewed flowsheet. Elevated this morning. Within normal limits in afternoon and evening. Continue to monitor.  9. Diabetes mellitus. Hemoglobin A1c 6.3. Glucophage 500 mg daily. Check blood sugars before meals and at bedtime   CBG (last 3)  Recent Labs    08/20/19 2100 08/21/19 0505 08/21/19 1135  GLUCAP 115* 91 93    2/2-- BGs well controlled - 99-129  2/3- BGs 70-80- with 1 of 225-  won't increase regimen anymore.  2/7- BGs 91-115- con't regimen.   10. Glaucoma/legally blind. Continue eyedrops as directed- continue pt's dark sunglasses for light sensitivity.  11. Hyperlipidemia. Lipitor  12. Constipation in setting of Neurogenic bowel. Adjust bowel program as needed.  1/28- LBM early AM 1/27  2/2- LBM Saturday- 4 days- will give Mg citrate and follow with suppository if needed.   2/3- had good sized BM yesterday without mg citrate.   2/4- LBM this AM  2/6- stop Colace BID; con't Senokot for now and monitor for loose stools 13. Neurogenic bladder- will check PVRS to make sure is emptying since needed a in/out cath due to lack of voiding 2 days ago.  1/27- ordered PVRs  1/29- added Flomax 0.4mg  qsupper since doesn't feel like emptying.   2/2- dribbling- will monitor  2/6- no complaints currently  14/ Hypokalemia  1/27- K+ 3.4- will replete  1/28- labs in AM  1/29- K+ 3.8  2/1- check labs in AM  2/2- K+ 3.7  LOS: 12 days A FACE TO FACE EVALUATION WAS PERFORMED  Elouise Divelbiss 08/21/2019, 1:44 PM

## 2019-08-22 ENCOUNTER — Inpatient Hospital Stay (HOSPITAL_COMMUNITY): Payer: Medicare Other | Admitting: Physical Therapy

## 2019-08-22 ENCOUNTER — Inpatient Hospital Stay (HOSPITAL_COMMUNITY): Payer: Medicare Other

## 2019-08-22 LAB — CBC WITH DIFFERENTIAL/PLATELET
Abs Immature Granulocytes: 0.01 10*3/uL (ref 0.00–0.07)
Basophils Absolute: 0 10*3/uL (ref 0.0–0.1)
Basophils Relative: 1 %
Eosinophils Absolute: 0.2 10*3/uL (ref 0.0–0.5)
Eosinophils Relative: 3 %
HCT: 34.5 % — ABNORMAL LOW (ref 39.0–52.0)
Hemoglobin: 11.4 g/dL — ABNORMAL LOW (ref 13.0–17.0)
Immature Granulocytes: 0 %
Lymphocytes Relative: 22 %
Lymphs Abs: 1.6 10*3/uL (ref 0.7–4.0)
MCH: 27.1 pg (ref 26.0–34.0)
MCHC: 33 g/dL (ref 30.0–36.0)
MCV: 82.1 fL (ref 80.0–100.0)
Monocytes Absolute: 0.8 10*3/uL (ref 0.1–1.0)
Monocytes Relative: 11 %
Neutro Abs: 4.6 10*3/uL (ref 1.7–7.7)
Neutrophils Relative %: 63 %
Platelets: 289 10*3/uL (ref 150–400)
RBC: 4.2 MIL/uL — ABNORMAL LOW (ref 4.22–5.81)
RDW: 13.1 % (ref 11.5–15.5)
WBC: 7.3 10*3/uL (ref 4.0–10.5)
nRBC: 0 % (ref 0.0–0.2)

## 2019-08-22 LAB — COMPREHENSIVE METABOLIC PANEL
ALT: 46 U/L — ABNORMAL HIGH (ref 0–44)
AST: 30 U/L (ref 15–41)
Albumin: 3.2 g/dL — ABNORMAL LOW (ref 3.5–5.0)
Alkaline Phosphatase: 78 U/L (ref 38–126)
Anion gap: 14 (ref 5–15)
BUN: 16 mg/dL (ref 8–23)
CO2: 26 mmol/L (ref 22–32)
Calcium: 9.4 mg/dL (ref 8.9–10.3)
Chloride: 99 mmol/L (ref 98–111)
Creatinine, Ser: 0.88 mg/dL (ref 0.61–1.24)
GFR calc Af Amer: >60 mL/min (ref >60)
GFR calc non Af Amer: >60 mL/min (ref >60)
Glucose, Bld: 107 mg/dL — ABNORMAL HIGH (ref 70–99)
Potassium: 3.7 mmol/L (ref 3.5–5.1)
Sodium: 139 mmol/L (ref 135–145)
Total Bilirubin: 0.7 mg/dL (ref 0.3–1.2)
Total Protein: 6.7 g/dL (ref 6.5–8.1)

## 2019-08-22 LAB — GLUCOSE, CAPILLARY
Glucose-Capillary: 99 mg/dL (ref 70–99)
Glucose-Capillary: 103 mg/dL — ABNORMAL HIGH (ref 70–99)
Glucose-Capillary: 101 mg/dL — ABNORMAL HIGH (ref 70–99)
Glucose-Capillary: 104 mg/dL — ABNORMAL HIGH (ref 70–99)

## 2019-08-22 NOTE — Progress Notes (Signed)
[facility] PHYSICAL MEDICINE & REHABILITATION PROGRESS NOTE   Subjective/Complaints: "Feeling ok" Good appetite.  Still has some soreness in his neck but pain is well controlled with pain medications.  Had BM this morning.   ROS: patient denies SOB, CP, abd pain, vision changes (is blind), sleep issues.  N/V/D/Constipation.   Objective:   No results found. Recent Labs    08/22/19 0539  WBC 7.3  HGB 11.4*  HCT 34.5*  PLT 289   Recent Labs    08/22/19 0539  NA 139  K 3.7  CL 99  CO2 26  GLUCOSE 107*  BUN 16  CREATININE 0.88  CALCIUM 9.4    Intake/Output Summary (Last 24 hours) at 08/22/2019 0857 Last data filed at 08/22/2019 0649 Gross per 24 hour  Intake 360 ml  Output 1650 ml  Net -1290 ml     Physical Exam: Vital Signs Blood pressure (!) 149/87, pulse 73, temperature 98.1 F (36.7 C), temperature source Oral, resp. rate 20, height 5\' 10"  (1.778 m), weight 76.1 kg, SpO2 95 %.  Constitutional: awake, alert, appropriate; sitting up in chair, eating breakfast and medications in applesauce. HENT:  Head: Normocephalic and atraumatic.  Eyes: wearing sunglasses   Neck:  ACDF incision site clean and dry with dry dressing- incision looks great- glued, not stapled; not wearing cervical collar since in bed Decreased cervical rotation; less tight trigger points in L upper traps, levators and scalenes as well as rhomboids- more on L side. TTP down to distal biceps on L arm Cardiovascular: RRR  Respiratory: CTA B/L no W/R/R  GI; soft, NT, ND, (+)BS-  Musculoskeletal:   Neck supple. No swelling anymore of LUE- nml size/swelling of RUE/hand  Comments: RUE- bicep 4/5, WE 4/5, triceps 4+/5, grip 4-/5, finger 4+/5 LUE- Biceps 3/5, WE 2+/5, triceps 3/5, grip 3/5, finger 3/5. No tenderness over left shoulder joint. RLE- HF 4-/5, KE 4/5, DF 4+/5, PF 4-/5 LLE- HF 2/5, KE 2/5, DF 2/5, PF 2-/5 Neurological:  Hoffman's LUE- strong; (-) Hoffman's in RUE;  no clonus in LEs  B/L Sensation intact at C3 B/L; otherwise decreased at every dermatome to L3 B/L ; L4-S2 dermatomes B/L very decreased sensation- same S3-S5 B/L Psychiatric:  Very appropriate and pleasant    Assessment/Plan: 1. Functional deficits secondary to C3 ASIA C/D quadriplegia  which require 3+ hours per day of interdisciplinary therapy in a comprehensive inpatient rehab setting.  Physiatrist is providing close team supervision and 24 hour management of active medical problems listed below.  Physiatrist and rehab team continue to assess barriers to discharge/monitor patient progress toward functional and medical goals  Care Tool:  Bathing    Body parts bathed by patient: Left arm, Chest, Abdomen, Front perineal area, Right upper leg, Left upper leg, Right lower leg   Body parts bathed by helper: Right arm, Buttocks, Left lower leg     Bathing assist Assist Level: Minimal Assistance - Patient > 75%     Upper Body Dressing/Undressing Upper body dressing   What is the patient wearing?: Pull over shirt    Upper body assist Assist Level: Minimal Assistance - Patient > 75%    Lower Body Dressing/Undressing Lower body dressing      What is the patient wearing?: Incontinence brief, Pants     Lower body assist Assist for lower body dressing: Maximal Assistance - Patient 25 - 49%     Toileting Toileting    Toileting assist Assist for toileting: Maximal Assistance - Patient 25 -  49%     Transfers Chair/bed transfer  Transfers assist     Chair/bed transfer assist level: Minimal Assistance - Patient > 75%     Locomotion Ambulation   Ambulation assist   Ambulation activity did not occur: Safety/medical concerns  Assist level: Minimal Assistance - Patient > 75% Assistive device: Walker-rolling Max distance: 40 ft   Walk 10 feet activity   Assist  Walk 10 feet activity did not occur: Safety/medical concerns  Assist level: Minimal Assistance - Patient > 75% Assistive  device: Walker-rolling, Orthosis   Walk 50 feet activity   Assist Walk 50 feet with 2 turns activity did not occur: Safety/medical concerns  Assist level: Moderate Assistance - Patient - 50 - 74% Assistive device: Walker-rolling, Orthosis    Walk 150 feet activity   Assist Walk 150 feet activity did not occur: Safety/medical concerns         Walk 10 feet on uneven surface  activity   Assist Walk 10 feet on uneven surfaces activity did not occur: Safety/medical concerns         Wheelchair     Assist Will patient use wheelchair at discharge?: Yes Type of Wheelchair: Manual Wheelchair activity did not occur: Safety/medical concerns  Wheelchair assist level: Minimal Assistance - Patient > 75% Max wheelchair distance: 150'    Wheelchair 50 feet with 2 turns activity    Assist    Wheelchair 50 feet with 2 turns activity did not occur: Safety/medical concerns   Assist Level: Minimal Assistance - Patient > 75%   Wheelchair 150 feet activity     Assist  Wheelchair 150 feet activity did not occur: Safety/medical concerns   Assist Level: Minimal Assistance - Patient > 75%   Blood pressure (!) 149/87, pulse 73, temperature 98.1 F (36.7 C), temperature source Oral, resp. rate 20, height 5\' 10"  (1.778 m), weight 76.1 kg, SpO2 95 %.    Medical Problem List and Plan:  1. Bilateral upper and lower extremity weakness with paresthesias left greater than right with gait disturbance secondary to cervical myelopathy.S/P multilevel discectomy C3-4, 4-5, 5-6 and C6-7 with decompression of spinal cord and exiting nerve roots with arthrodesis 08/05/2019 per Dr. Kathyrn Sheriff. HARD Cervical collar when out of bed.  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  -patient may Shower if covers neck incision   Continue CIR PT, OT -ELOS/Goals: Supervision to Min assist - 3-3.5 weeks due to SCI   2/1- will remove honeycomb dressing and place daily dry  dressing  2/5- looking good, incision wise  2/6- making sensory and moror gains basically daily.   2/7- goals increased 2/6 due to great progress 2. Antithrombotics:  -DVT/anticoagulation: SCDs. Discuss Lovenox for DVT prophylaxis with neurosurgery. Check vascular study   1/27- will start lovenox due to increased risk of DVT -antiplatelet therapy: Aspirin 81 mg to begin 08/12/2019  3. Pain Management: Post op pain, nerve pain and spasticity- Hydrocodone/Robaxin as needed  -will try and change to Baclofen for muscle spasms/spasticity once gets to CIR and possibly add Duloxetine for nerve pain   1/27- added baclofen 5 mg TID for spasticity and Duloxetine 30 mg QHS for nerve pain  1/28- add Lidoderm patches to L neck during day. 12 hrs on;12 hrs off  1/29- will increase Cymbalta on Monday. Lidoderm effective for L shoulder pain.   1/30: pain is well controlled  1/31: Complains of some pain over left shoulder. Lidocaine patch on. Not tender to palpation. Likely secondary to scapular  destablization given left upper extremity hemiparesis. Ordered diclofenac gel 4 times daily prn for pain.   2/1- will increase Cymbalta to 60 mg daily.   2/8: pain well controlled 4. Mood: Provide emotional support  -antipsychotic agents: N/A  5. Neuropsych: This patient is capable of making decisions on his own behalf.  6. Skin/Wound Care: Routine skin checks  7. Fluids/Electrolytes/Nutrition: Routine in and outs with follow-up chemistries  8. Hypertension. Toprol-XL 25 mg daily, Lotensin 10 mg daily, Norvasc 5 mg daily. Monitor with increased mobility   1/30: Uncontrolled on last several reads. Will increase Norvasc to 10mg  daily.   2/8: Reviewed flowsheet. Elevated this morning and last night but low yesterday afternoon so will not increase medication dose. Continue to monitor.  9. Diabetes mellitus. Hemoglobin A1c 6.3. Glucophage 500 mg daily. Check blood sugars before meals and at bedtime   CBG (last 3)   Recent Labs    08/21/19 1706 08/21/19 2106 08/22/19 0508  GLUCAP 106* 124* 99    2/2-- BGs well controlled - 99-129  2/3- BGs 70-80- with 1 of 225- won't increase regimen anymore.  2/7- BGs 91-115- con't regimen.   2/8: well controlled. Continue to monitor.   10. Glaucoma/legally blind. Continue eyedrops as directed- continue pt's dark sunglasses for light sensitivity.  11. Hyperlipidemia. Lipitor  12. Constipation in setting of Neurogenic bowel. Adjust bowel program as needed.  1/28- LBM early AM 1/27  2/2- LBM Saturday- 4 days- will give Mg citrate and follow with suppository if needed.   2/3- had good sized BM yesterday without mg citrate.   2/4- LBM this AM  2/6- stop Colace BID; con't Senokot for now and monitor for loose stools 13. Neurogenic bladder- will check PVRS to make sure is emptying since needed a in/out cath due to lack of voiding 2 days ago.  1/27- ordered PVRs  1/29- added Flomax 0.4mg  qsupper since doesn't feel like emptying.   2/2- dribbling- will monitor  2/6- no complaints currently  14/ Hypokalemia  1/27- K+ 3.4- will replete  1/28- labs in AM  1/29- K+ 3.8  2/1- check labs in AM  2/2- K+ 3.7 15. Anemia: 2/8 Hgb is 11.4, stable from 6 days ago.   LOS: 13 days A FACE TO FACE EVALUATION WAS PERFORMED  2/29 Yaminah Clayborn 08/22/2019, 8:57 AM

## 2019-08-22 NOTE — Progress Notes (Signed)
Physical Therapy Session Note  Patient Details  Name: Brian Daniels MRN: [id number] Date of Birth: [birth year]  Today's Date: 08/22/2019 PT Individual Time: 0845-1000 PT Individual Time Calculation (min): 75 min   Short Term Goals: Week 2:  PT Short Term Goal 1 (Week 2): Pt will complete transfers with min A consistently PT Short Term Goal 2 (Week 2): Pt will ambulate x 75 ft with LRAD and min A PT Short Term Goal 3 (Week 2): Pt will propel manual w/c x 150 ft at Supervision level  Skilled Therapeutic Interventions/Progress Updates:    Pt received seated in w/c in room, agreeable to PT session. Pt is CGA to min A for sit to stands to RW throughout session. Ambulation 3 x 80 ft with RW and min A, focus on widening BOS during gait and BLE heel strike. Pt continues to exhibit narrow BOS leading to R lateral lean in standing. Pt continues to exhibit some path deviation to the R during gait but exhibits decreased assist needed for steering. Trial gait with RW without use of L hand orthosis, pt demos improved grip strength and ability to steer RW with use of LUE. Static standing balance: Romberg stance and modified tandem stance (L/R) 3 x 30 sec each with no UE support and CGA to min A for balance. Pt exhibits some lateral leaning to the R requiring min A to correct as well as some A/P sway. Sidesteps L/R 2 x 10 ft with RW and CGA for balance for BLE coordination training. Nustep level 4 x 5 min with use of B UE/LE for global endurance training and NMR via forced use reciprocal movement training. Pt left seated in w/c in room with needs in reach, quick release belt in place at end of session.  Therapy Documentation Precautions:  Precautions Precautions: Fall Precaution Comments: pt legally blind in both eyes Required Braces or Orthoses: Cervical Brace Cervical Brace: Hard collar Restrictions Weight Bearing Restrictions: No    Therapy/Group: Individual Therapy   Peter Congo, PT,  DPT  08/22/2019, 12:45 PM

## 2019-08-22 NOTE — Progress Notes (Signed)
Pt checked qh for safety. Bed low with fall alarm on and call bell within reach.  

## 2019-08-22 NOTE — Progress Notes (Signed)
Occupational Therapy Session Note  Patient Details  Name: Brian Daniels MRN: [id number] Date of Birth: 12/22/1955  Today's Date: 08/22/2019 OT Individual Time: 1330-1430 OT Individual Time Calculation (min): 60 min    Short Term Goals: Week 2:  OT Short Term Goal 1 (Week 2): Pt will be able to don a shirt with min A OT Short Term Goal 2 (Week 2): Pt will bathe his buttocks with mod A for standing OT Short Term Goal 3 (Week 2): Pt will complete LB dressing tasks with mod A OT Short Term Goal 4 (Week 2): Pt will complete toileting tasks with max A  Skilled Therapeutic Interventions/Progress Updates:    Pt resting in bed upon arrival.  Sister present during first 30 mins. OT intervention with discussion of DME needs and pt progress. Pt has BSC. Pt's sister will advise if TTB needed. Supine>sit with supervison. Stand pivot transfer with RW with CGA. Pt transitioned to gym for LUE activities with [employer] and ball. Focus on shoulder flexion in unsupported sitting.  Pt with 90* AROM and 120* PROM. Pt returned to room and requested to return to bed.  SPT and sit>supine with supervisoin.  Pt remained in bed with all needs within reach and bed alarm activated.   Therapy Documentation Precautions:  Precautions Precautions: Fall Precaution Comments: pt legally blind in both eyes Required Braces or Orthoses: Cervical Brace Cervical Brace: Hard collar Restrictions Weight Bearing Restrictions: No Pain:  Pt c/o ongoing LUE/shoulder discomfort (unrated) but commented that he is noting improvement; repositioned   Therapy/Group: Individual Therapy  Rich Brave 08/22/2019, 2:53 PM

## 2019-08-22 NOTE — Plan of Care (Signed)
  Problem: Consults Goal: RH SPINAL CORD INJURY PATIENT EDUCATION Description:  See Patient Education module for education specifics.  Outcome: Progressing Goal: Diabetes Guidelines if Diabetic/Glucose > 140 Description: If diabetic or lab glucose is > 140 mg/dl - Initiate Diabetes/Hyperglycemia Guidelines & Document Interventions  Outcome: Progressing   Problem: SCI BOWEL ELIMINATION Goal: RH STG MANAGE BOWEL WITH ASSISTANCE Description: STG Manage Bowel with mod I Assistance. Outcome: Progressing Flowsheets (Taken 08/22/2019 1542) STG: Pt will manage bowels with assistance: 6-Modified independent Goal: RH STG SCI MANAGE BOWEL WITH MEDICATION WITH ASSISTANCE Description: STG SCI Manage bowel with medication with mod I assistance. Outcome: Progressing   Problem: SCI BLADDER ELIMINATION Goal: RH STG MANAGE BLADDER WITH ASSISTANCE Description: STG Manage Bladder With mod I Assistance Outcome: Progressing Flowsheets (Taken 08/22/2019 1542) STG: Pt will manage bladder with assistance: 6-Modified independent   Problem: RH PAIN MANAGEMENT Goal: RH STG PAIN MANAGED AT OR BELOW PT'S PAIN GOAL Description: Pain level less than 4 on scale of 0-10 Outcome: Progressing   Problem: RH KNOWLEDGE DEFICIT SCI Goal: RH STG INCREASE KNOWLEDGE OF SELF CARE AFTER SCI Description: Pt and family will be able to adhere to medication regimen and dietary recommendations to regulate bowel and bladder independently. Pt and family will demonstrate understanding of spinal cord injury and precautions to take to prevent falls independently.    Outcome: Progressing

## 2019-08-22 NOTE — Progress Notes (Signed)
Occupational Therapy Session Note  Patient Details  Name: Brian Daniels MRN: [id number] Date of Birth: [birth year]  Today's Date: 08/22/2019 OT Individual Time: 4696-2952 OT Individual Time Calculation (min): 71 min    Short Term Goals: Week 2:  OT Short Term Goal 1 (Week 2): Pt will be able to don a shirt with min A OT Short Term Goal 2 (Week 2): Pt will bathe his buttocks with mod A for standing OT Short Term Goal 3 (Week 2): Pt will complete LB dressing tasks with mod A OT Short Term Goal 4 (Week 2): Pt will complete toileting tasks with max A  Skilled Therapeutic Interventions/Progress Updates:    Pt resting in bed upon arrival.  OT intervention with focus on bed mobility, sitting balance, functional tranfsers, BADL retraining including bathing at shower level and dressing with sit<>stand, standing balance, LUE funciton, and activity tolerance to increase independence with BADLs.  See Care Tool for assist level with bathing/dressing. Supine>sit EOB with supervision, sit<>stand CGA, functional tranfsers-min A, standing balance-CGA. Pt able to pull pants up while CGA provided for standing balance. Pt continues to improve with LUE function and able to apply deodorant in R axilla using LUE. Pt able to thread BUE into shirt sleeves and RLE into pants without assistance. Pt pleased with continued progress.  Pt remained in w/c with all needs within reach and soft call bell on chest and accessible by pt. Belt alarm activated.   Therapy Documentation Precautions:  Precautions Precautions: Fall Precaution Comments: pt legally blind in both eyes Required Braces or Orthoses: Cervical Brace Cervical Brace: Hard collar Restrictions Weight Bearing Restrictions: No Pain: Pt c/o LUE/shoulder discomfort-unrated; activity, PROM, repositioned  Therapy/Group: Individual Therapy  Rich Brave 08/22/2019, 8:12 AM

## 2019-08-23 ENCOUNTER — Inpatient Hospital Stay (HOSPITAL_COMMUNITY): Payer: Medicare Other

## 2019-08-23 ENCOUNTER — Inpatient Hospital Stay (HOSPITAL_COMMUNITY): Payer: Medicare Other | Admitting: Physical Therapy

## 2019-08-23 LAB — GLUCOSE, CAPILLARY
Glucose-Capillary: 92 mg/dL (ref 70–99)
Glucose-Capillary: 135 mg/dL — ABNORMAL HIGH (ref 70–99)
Glucose-Capillary: 81 mg/dL (ref 70–99)
Glucose-Capillary: 155 mg/dL — ABNORMAL HIGH (ref 70–99)

## 2019-08-23 MED ORDER — GUAIFENESIN-DM 100-10 MG/5ML PO SYRP: 5.0000 mL | ORAL_SOLUTION | ORAL | Status: DC | PRN

## 2019-08-23 NOTE — Progress Notes (Signed)
Occupational Therapy Weekly Progress Note  Patient Details  Name: Shriyan Arakawa MRN: [id number] Date of Birth: 1956-05-24  Beginning of progress report period: August 16, 2019 End of progress report period: August 23, 2019  Patient has met 3 of 4 short term goals.  Pt is making steady progress with BADLs and functional transfers.  Pt completes all transfers using RW with CGA.  Sit<>stand with S/CGA. Mod A for bathing and LB dressing.  Min A for UB dressing.  Pt requires max A for toileting.  Pt with improved functional use of LUE duing BADLs. Pt's sister has observed therapy sessions but has not participated. Pt pleased with progress.  Patient continues to demonstrate the following deficits: muscle weakness, decreased cardiorespiratoy endurance, impaired timing and sequencing, abnormal tone and decreased coordination and decreased standing balance, decreased postural control and decreased balance strategies and therefore will continue to benefit from skilled OT intervention to enhance overall performance with BADL and Reduce care partner burden.  Patient progressing toward long term goals..  Continue plan of care.  OT Short Term Goals Week 2:  OT Short Term Goal 1 (Week 2): Pt will be able to don a shirt with min A OT Short Term Goal 1 - Progress (Week 2): Met OT Short Term Goal 2 (Week 2): Pt will bathe his buttocks with mod A for standing OT Short Term Goal 2 - Progress (Week 2): Met OT Short Term Goal 3 (Week 2): Pt will complete LB dressing tasks with mod A OT Short Term Goal 3 - Progress (Week 2): Progressing toward goal OT Short Term Goal 4 (Week 2): Pt will complete toileting tasks with max A OT Short Term Goal 4 - Progress (Week 2): Met Week 3:  OT Short Term Goal 1 (Week 3): STG=LTG secondary to ELOS   Leroy Libman 08/23/2019, 6:54 AM

## 2019-08-23 NOTE — Progress Notes (Signed)
Physical Therapy Session Note  Patient Details  Name: Brian Daniels MRN: [id number] Date of Birth: 1955-07-22  Today's Date: 08/23/2019 PT Individual Time: 0915-1000 PT Individual Time Calculation (min): 45 min   Short Term Goals: Week 2:  PT Short Term Goal 1 (Week 2): Pt will complete transfers with min A consistently PT Short Term Goal 2 (Week 2): Pt will ambulate x 75 ft with LRAD and min A PT Short Term Goal 3 (Week 2): Pt will propel manual w/c x 150 ft at Supervision level  Skilled Therapeutic Interventions/Progress Updates:    Pt received seated in w/c in room, agreeable to PT session. Pt reports soreness in L shoulder, not rated and declines intervention. Sit to stand with CGA to RW throughout session. Ascend/descend 8 x 3" stairs with 2 handrails and mod A, max multimodal cueing for sequencing and safety. Ascend/descend one 4" curb step with RW and mod A with max cueing for safety,sequencing, and RW management. Ambulation x 150 ft with RW and min A with cues for widening BOS and for steering RW. Pt exhibits improved endurance for gait this date and improved overall standing balance. Pt left seated in w/c in room with needs in reach at end of session, quick release belt in place.  Therapy Documentation Precautions:  Precautions Precautions: Fall Precaution Comments: pt legally blind in both eyes Required Braces or Orthoses: Cervical Brace Cervical Brace: Hard collar Restrictions Weight Bearing Restrictions: No    Therapy/Group: Individual Therapy   Peter Congo, PT, DPT  08/23/2019, 10:54 AM

## 2019-08-23 NOTE — Progress Notes (Signed)
Physical Therapy Session Note  Patient Details  Name: Brian Daniels MRN: [id number] Date of Birth: Jul 26, 1955  Today's Date: 08/23/2019 PT Individual Time: 1330-1430 PT Individual Time Calculation (min): 60 min   Short Term Goals: Week 2:  PT Short Term Goal 1 (Week 2): Pt will complete transfers with min A consistently PT Short Term Goal 2 (Week 2): Pt will ambulate x 75 ft with LRAD and min A PT Short Term Goal 3 (Week 2): Pt will propel manual w/c x 150 ft at Supervision level  Skilled Therapeutic Interventions/Progress Updates:   Session focused on NMR to address balance, coordination, postural control re-training, and strengthening as well as addressing functional transfers and gait with RW. Pt performs sit <> stands throughout session with overall CGA and improved technique with improved anterior weightshift. On Kinetron focused on NMR as described above in standing position for stepping intervals x 1 min at a time x 3 reps and then balance reactions with 1 or no UE support to increase challenge in 1 min intervals. Transitional movement for sit <> stand on uneven surface with min assist for balance. Education provided on progression of balance challenges including removing various systems (ex. Visual impairments already add increased challenge). Cues for increased upright posture and hip/knee extension as still tendency to flex as fatigues. Gait x 150' with overall CGA to min assist with focus on step length, widening BOS, coordination, and maintaining upright postural control. Pt with overall improved gait pattern and improved ability to self correct when LOB occurs. End of session performed CGA ambulatory transfer to bed with cues due to visual impairments. Returned to supine with CGA to reposition in the bed. Sister at bedside.   Therapy Documentation Precautions:  Precautions Precautions: Fall Precaution Comments: pt legally blind in both eyes Required Braces or Orthoses: Cervical  Brace Cervical Brace: Hard collar Restrictions Weight Bearing Restrictions: No Pain - denies pain.    Therapy/Group: Individual Therapy  [name] [name], PT, DPT, CBIS  08/23/2019, 3:15 PM

## 2019-08-23 NOTE — Patient Care Conference (Signed)
Inpatient RehabilitationTeam Conference and Plan of Care Update Date: 08/23/2019   Time: 11:05 AM   Patient Name: Brian Daniels      Medical Record Number: [id number]  Date of Birth: Oct 02, 1955 Sex: Male         Room/Bed: 4M02C/4M02C-01 Payor Info: Payor: [employer] MEDICARE / Plan: BCBS MEDICARE / Product Type: *No Product type* /    Admit Date/Time:  08/09/2019  4:03 PM  Primary Diagnosis:  Cervical myelopathy (HCC)  Patient Active Problem List   Diagnosis Date Noted  . Blindness 08/11/2019  . Neurogenic bladder 08/09/2019  . Spasticity 08/09/2019  . 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 Cervical myelopathy (HCC) 06/30/2019  . Bilateral low back pain without sciatica 06/13/2019  . Weakness 06/13/2019  . Paresthesia 06/13/2019  . Urinary urgency 06/13/2019  . Gait abnormality 06/13/2019    Expected Discharge Date: Expected Discharge Date: 08/27/19  Team Members Present: Physician leading conference: Dr. Genice Rouge Social Worker Present: Amada Jupiter, LCSW; Other (comment)(Auria Jackson, [place]) Nurse Present: Jesusita Oka, LPN Case manager: Roderic Palau, RN PT Present: Peter Congo, PT OT Present: Ardis Rowan, COTA;Jennifer Katrinka Blazing, OT SLP Present: Suzzette Righter, CF-SLP PPS Coordinator present : Edson Snowball, [place], SLP     Current Status/Progress Goal Weekly Team Focus  Bowel/Bladder   continues to be continent with occasional occurences of urinary leakage; no occurences of bowel incontinence  decrease episodes of urinary incontinence d/t urgency by calling for staff assistance  reminders to contact staff at time of urinary relief sensation   Swallow/Nutrition/ Hydration             ADL's   bed mobility-S/CGA; bathing-min A; UB dressing-min A; LB dressing-mod A; sit<>stand-S/CGA; functional tranfsers-CGA; toleting-mod A; improved functional use of LUE  min A overall  BADL retraining, functional tranfsers, education, safety  awareness, LUE funciton   Mobility   CGA to min A bed mobility, min A transfers with RW, gait up to 100 ft with RW min A, S to min A w/c mobility  min A overall  dynamic standing balance, LE NMR, transfers, gait   Communication             Safety/Cognition/ Behavioral Observations            Pain   adequate pain control with minimal use of hydrocodone  adequate pain control without narcotic  adequate pain relief interventions   Skin   surgical incision continues to be healing well  continue healing process with no s/s of infection  continue interventions to promote wound healing    Rehab Goals Patient on target to meet rehab goals: Yes *See Care Plan and progress notes for long and short-term goals.     Barriers to Discharge  Current Status/Progress Possible Resolutions Date Resolved   Nursing                  PT  Other (comments)  legally blind, increased need for cues for safety              OT                  SLP                SW                Discharge Planning/Teaching Needs:  D/c to home  Family education per therapy recommendations   Team Discussion: Blind, inc SCI, L shou  pain, tightness, on baclofen, cough on robitussin.  RN pain, lidocaine patches, cont/inc.  PT CGA/min A bed, stand CGA/min A, 150' min A, stairs mod A.  OT L arm/shou function improving, tightness, transfers CGA/S, LB Dressing making progress.  Has a sister.   Revisions to Treatment Plan: N/A     Medical Summary Current Status: continent x2; LBM today; BGs good; no coverage;- moving d/c to 2/15 Weekly Focus/Goal: PT- CGA-min asssit bed; standing CGA-min assist to RW: 142ft RW min assist; mod assist on stairs  Barriers to Discharge: Decreased family/caregiver support;Other (comments);Home enviroment access/layout  Barriers to Discharge Comments: blindness/pain Possible Resolutions to Barriers: can now apply deodorant with L arm; did some soft tissue work on upper traps- not true tone,  myofascial pain; CGA transfers; on target   Continued Need for Acute Rehabilitation Level of Care: The patient requires daily medical management by a physician with specialized training in physical medicine and rehabilitation for the following reasons: Direction of a multidisciplinary physical rehabilitation program to maximize functional independence : Yes Medical management of patient stability for increased activity during participation in an intensive rehabilitation regime.: Yes Analysis of laboratory values and/or radiology reports with any subsequent need for medication adjustment and/or medical intervention. : Yes   I attest that I was present, lead the team conference, and concur with the assessment and plan of the team.   Retta Diones 08/23/2019, 5:40 PM   Team conference was held via web/ teleconference due to [place] - 19

## 2019-08-23 NOTE — Progress Notes (Signed)
[facility] PHYSICAL MEDICINE & REHABILITATION PROGRESS NOTE   Subjective/Complaints:  Had cough last night- better than it was.  LBM yesterday.  ROS: patient denies SOB, CP, abd pain, vision changes (is blind), sleep issues.  N/V/D/Constipation.   Objective:   No results found. Recent Labs    08/22/19 0539  WBC 7.3  HGB 11.4*  HCT 34.5*  PLT 289   Recent Labs    08/22/19 0539  NA 139  K 3.7  CL 99  CO2 26  GLUCOSE 107*  BUN 16  CREATININE 0.88  CALCIUM 9.4    Intake/Output Summary (Last 24 hours) at 08/23/2019 0846 Last data filed at 08/23/2019 0545 Gross per 24 hour  Intake 946 ml  Output 1525 ml  Net -579 ml     Physical Exam: Vital Signs Blood pressure 139/81, pulse 71, temperature 98 F (36.7 C), temperature source Oral, resp. rate 18, height 5\' 10"  (1.778 m), weight 76.1 kg, SpO2 100 %.  Constitutional: awake, alert, appropriate; sitting up in highbacked W/C; OT in room; NAD HENT:  Head: Normocephalic and atraumatic.  Eyes: wearing sunglasses   Neck:  ACDF incision site clean and dry with dry dressing- incision looks great- glued, not stapled; not wearing cervical collar since in bed Decreased cervical rotation; less tight trigger points in L upper traps, levators and scalenes as well as rhomboids- more on L side. TTP down to distal biceps on L arm Cardiovascular: RRR  Respiratory: CTA B/L no W/R/R; no coughing heard  GI; soft, NT, ND, (+)BS-  Musculoskeletal:   Neck supple. No swelling anymore of LUE- nml size/swelling of RUE/hand  Comments: RUE- bicep 4/5, WE 4/5, triceps 4+/5, grip 4-/5, finger 4+/5 LUE- Biceps 3/5, WE 2+/5, triceps 3/5, grip 3/5, finger 3/5. No tenderness over left shoulder joint. RLE- HF 4-/5, KE 4/5, DF 4+/5, PF 4-/5 LLE- HF 2/5, KE 2/5, DF 2/5, PF 2-/5 Neurological:  Hoffman's LUE- strong; (-) Hoffman's in RUE;  no clonus in LEs B/L Sensation intact at C3 B/L; otherwise decreased at every dermatome to L3 B/L ; L4-S2 dermatomes  B/L very decreased sensation- same S3-S5 B/L Psychiatric:  Very appropriate and pleasant    Assessment/Plan: 1. Functional deficits secondary to C3 ASIA C/D quadriplegia  which require 3+ hours per day of interdisciplinary therapy in a comprehensive inpatient rehab setting.  Physiatrist is providing close team supervision and 24 hour management of active medical problems listed below.  Physiatrist and rehab team continue to assess barriers to discharge/monitor patient progress toward functional and medical goals  Care Tool:  Bathing    Body parts bathed by patient: Left arm, Chest, Abdomen, Front perineal area, Right upper leg, Left upper leg, Right lower leg   Body parts bathed by helper: Right arm, Buttocks, Left lower leg     Bathing assist Assist Level: Minimal Assistance - Patient > 75%     Upper Body Dressing/Undressing Upper body dressing   What is the patient wearing?: Pull over shirt    Upper body assist Assist Level: Minimal Assistance - Patient > 75%    Lower Body Dressing/Undressing Lower body dressing      What is the patient wearing?: Incontinence brief, Pants     Lower body assist Assist for lower body dressing: Maximal Assistance - Patient 25 - 49%     Toileting Toileting    Toileting assist Assist for toileting: Total Assistance - Patient < 25%     Transfers Chair/bed transfer  Transfers assist  Chair/bed transfer assist level: Minimal Assistance - Patient > 75%     Locomotion Ambulation   Ambulation assist   Ambulation activity did not occur: Safety/medical concerns  Assist level: Minimal Assistance - Patient > 75% Assistive device: Walker-rolling Max distance: 80'   Walk 10 feet activity   Assist  Walk 10 feet activity did not occur: Safety/medical concerns  Assist level: Minimal Assistance - Patient > 75% Assistive device: Walker-rolling   Walk 50 feet activity   Assist Walk 50 feet with 2 turns activity did not  occur: Safety/medical concerns  Assist level: Minimal Assistance - Patient > 75% Assistive device: Walker-rolling    Walk 150 feet activity   Assist Walk 150 feet activity did not occur: Safety/medical concerns         Walk 10 feet on uneven surface  activity   Assist Walk 10 feet on uneven surfaces activity did not occur: Safety/medical concerns         Wheelchair     Assist Will patient use wheelchair at discharge?: Yes Type of Wheelchair: Manual Wheelchair activity did not occur: Safety/medical concerns  Wheelchair assist level: Minimal Assistance - Patient > 75% Max wheelchair distance: 150'    Wheelchair 50 feet with 2 turns activity    Assist    Wheelchair 50 feet with 2 turns activity did not occur: Safety/medical concerns   Assist Level: Minimal Assistance - Patient > 75%   Wheelchair 150 feet activity     Assist  Wheelchair 150 feet activity did not occur: Safety/medical concerns   Assist Level: Minimal Assistance - Patient > 75%   Blood pressure 139/81, pulse 71, temperature 98 F (36.7 C), temperature source Oral, resp. rate 18, height 5\' 10"  (1.778 m), weight 76.1 kg, SpO2 100 %.    Medical Problem List and Plan:  1. Bilateral upper and lower extremity weakness with paresthesias left greater than right with gait disturbance secondary to cervical myelopathy.S/P multilevel discectomy C3-4, 4-5, 5-6 and C6-7 with decompression of spinal cord and exiting nerve roots with arthrodesis 08/05/2019 per Dr. 08/07/2019. HARD Cervical collar when out of bed.  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  -patient may Shower if covers neck incision   Continue CIR PT, OT -ELOS/Goals: Supervision to Min assist - 3-3.5 weeks due to SCI   2/1- will remove honeycomb dressing and place daily dry dressing  2/5- looking good, incision wise  2/6- making sensory and moror gains basically daily.   2/7- goals increased 2/6 due to  great progress 2. Antithrombotics:  -DVT/anticoagulation: SCDs. Discuss Lovenox for DVT prophylaxis with neurosurgery. Check vascular study   1/27- will start lovenox due to increased risk of DVT -antiplatelet therapy: Aspirin 81 mg to begin 08/12/2019  3. Pain Management: Post op pain, nerve pain and spasticity- Hydrocodone/Robaxin as needed  -will try and change to Baclofen for muscle spasms/spasticity once gets to CIR and possibly add Duloxetine for nerve pain   1/27- added baclofen 5 mg TID for spasticity and Duloxetine 30 mg QHS for nerve pain  1/28- add Lidoderm patches to L neck during day. 12 hrs on;12 hrs off  1/29- will increase Cymbalta on Monday. Lidoderm effective for L shoulder pain.   1/30: pain is well controlled  1/31: Complains of some pain over left shoulder. Lidocaine patch on. Not tender to palpation. Likely secondary to scapular destablization given left upper extremity hemiparesis. Ordered diclofenac gel 4 times daily prn for pain.   2/1- will increase Cymbalta  to 60 mg daily.   2/8: pain well controlled 4. Mood: Provide emotional support  -antipsychotic agents: N/A  5. Neuropsych: This patient is capable of making decisions on his own behalf.  6. Skin/Wound Care: Routine skin checks  7. Fluids/Electrolytes/Nutrition: Routine in and outs with follow-up chemistries  8. Hypertension. Toprol-XL 25 mg daily, Lotensin 10 mg daily, Norvasc 5 mg daily. Monitor with increased mobility   1/30: Uncontrolled on last several reads. Will increase Norvasc to 10mg  daily.   2/8: Reviewed flowsheet. Elevated this morning and last night but low yesterday afternoon so will not increase medication dose. Continue to monitor.  9. Diabetes mellitus. Hemoglobin A1c 6.3. Glucophage 500 mg daily. Check blood sugars before meals and at bedtime   CBG (last 3)  Recent Labs    08/22/19 1636 08/22/19 2117 08/23/19 0601  GLUCAP 101* 104* 92    2/2-- BGs well controlled - 99-129  2/3- BGs  70-80- with 1 of 225- won't increase regimen anymore.  2/7- BGs 91-115- con't regimen.   2/8: well controlled. Continue to monitor.   10. Glaucoma/legally blind. Continue eyedrops as directed- continue pt's dark sunglasses for light sensitivity.  11. Hyperlipidemia. Lipitor  12. Constipation in setting of Neurogenic bowel. Adjust bowel program as needed.  1/28- LBM early AM 1/27  2/2- LBM Saturday- 4 days- will give Mg citrate and follow with suppository if needed.   2/3- had good sized BM yesterday without mg citrate.   2/4- LBM this AM  2/6- stop Colace BID; con't Senokot for now and monitor for loose stools 13. Neurogenic bladder- will check PVRS to make sure is emptying since needed a in/out cath due to lack of voiding 2 days ago.  1/27- ordered PVRs  1/29- added Flomax 0.4mg  qsupper since doesn't feel like emptying.   2/2- dribbling- will monitor  2/6- no complaints currently  14/ Hypokalemia  1/27- K+ 3.4- will replete  1/28- labs in AM  1/29- K+ 3.8  2/1- check labs in AM  2/2- K+ 3.7 15. Anemia: 2/8 Hgb is 11.4, stable from 6 days ago.  16. Cough-  2/9- added Robitussin-DM q4 hours prn  LOS: 14 days A FACE TO FACE EVALUATION WAS PERFORMED  Sho Salguero 08/23/2019, 8:46 AM

## 2019-08-23 NOTE — Progress Notes (Signed)
Occupational Therapy Session Note  Patient Details  Name: Marlo Goodrich MRN: [id number] Date of Birth: Jun 27, 1956  Today's Date: 08/23/2019 OT Individual Time: 0700-0800 OT Individual Time Calculation (min): 60 min    Short Term Goals: Week 3:  OT Short Term Goal 1 (Week 3): STG=LTG secondary to ELOS  Skilled Therapeutic Interventions/Progress Updates:    OT intervention with funcitonal transfers, BADL retraining, standing balance, and safety awareness to increase independence with BADLs. See Care Tool for bathing/dressing assist levels. Supine>sit EOB with supervisoin.  Sit<>stand with CGA. Pt able to apply deodorant to R axilla with LUE this morning.  Pt used LUE to assist with pulling pants over hips while standing. Pt able to thread RLE into pants but required assistance with LLE. Standing balance for ADLs with CGA/supervision. Pt remained in w/c with all needs within reach and belt alarm activated.   Therapy Documentation Precautions:  Precautions Precautions: Fall Precaution Comments: pt legally blind in both eyes Required Braces or Orthoses: Cervical Brace Cervical Brace: Hard collar Restrictions Weight Bearing Restrictions: No   Pain:  Pt states his LUE/shoulder feels better this morning   Therapy/Group: Individual Therapy  Rich Brave 08/23/2019, 9:58 AM

## 2019-08-23 NOTE — Progress Notes (Signed)
Occupational Therapy Session Note  Patient Details  Name: Brian Daniels MRN: [id number] Date of Birth: March 26, 1956  Today's Date: 08/23/2019 OT Individual Time: 1130-1200 OT Individual Time Calculation (min): 30 min    Short Term Goals: Week 3:  OT Short Term Goal 1 (Week 3): STG=LTG secondary to ELOS  Skilled Therapeutic Interventions/Progress Updates:    OT intervention with focus on LUE AAROM/AROM/PROM to increase independence with BADLS. Pt engaged in straight arm raises with Gonzella Lex and unweighted ball (3X10) and chest presses (3X10). Pt now able to reach to forehead with LUE/hand. Pt pleased with progress. Informed pt that since he is making such good progress, his d/c date has moved up to 2/13. Pt pleased with this news and called his sister on return to room.  Pt remained in w/c with all needs within reach and belt alarm activated.   Therapy Documentation Precautions:  Precautions Precautions: Fall Precaution Comments: pt legally blind in both eyes Required Braces or Orthoses: Cervical Brace Cervical Brace: Hard collar Restrictions Weight Bearing Restrictions: No   Pain:  Pt c/o LUE/shoulder discomfort with activity and AAROM; repositioned and emotional support   Therapy/Group: Individual Therapy  Rich Brave 08/23/2019, 12:23 PM

## 2019-08-23 NOTE — Progress Notes (Deleted)
Pt checked qh for safety. Bed low with fall alarm on and call bell within reach. NS infusing into peripheral access right wrist without difficulty. Tube feed running at 50cc/hr into peg tube without difficulty.

## 2019-08-23 NOTE — Progress Notes (Signed)
Pt checked qh for safety. Bed low with fall alarm on and call bell within reach.  

## 2019-08-23 NOTE — Progress Notes (Signed)
Social Work Patient ID: Nichalos Brenton, male   DOB: 09-27-1955, 65 y.o.   MRN: [id number]   SW met with pt in room to provide updates from rehab team, and change in pt discharge date to 08/27/2019. SW informed will notify his sister.  SW spoke with pt sister [name] [phone]) to inform on above. SW provided recommendations HHPT/OT and DME: RW. SW reiterated RW will need to be a private purchase since pt already has a w/c. SW informed on reviewing HHA list ([email or web address]). and informed SW will follow up on preference.  *SW left HHA list in pt room.  Loralee Pacas, MSW, [place] Office (preferred): [phone] Cell: [phone] Fax: [phone]

## 2019-08-24 ENCOUNTER — Ambulatory Visit (HOSPITAL_COMMUNITY): Payer: Medicare Other | Admitting: *Deleted

## 2019-08-24 ENCOUNTER — Inpatient Hospital Stay (HOSPITAL_COMMUNITY): Payer: Medicare Other

## 2019-08-24 LAB — GLUCOSE, CAPILLARY
Glucose-Capillary: 93 mg/dL (ref 70–99)
Glucose-Capillary: 112 mg/dL — ABNORMAL HIGH (ref 70–99)
Glucose-Capillary: 96 mg/dL (ref 70–99)
Glucose-Capillary: 162 mg/dL — ABNORMAL HIGH (ref 70–99)

## 2019-08-24 NOTE — Progress Notes (Signed)
Occupational Therapy Session Note  Patient Details  Name: Brian Daniels MRN: [id number] Date of Birth: Aug 09, 1955  Today's Date: 08/24/2019 OT Individual Time: 1300-1355 OT Individual Time Calculation (min): 55 min    Short Term Goals: Week 2:  OT Short Term Goal 1 (Week 2): Pt will be able to don a shirt with min A OT Short Term Goal 1 - Progress (Week 2): Met OT Short Term Goal 2 (Week 2): Pt will bathe his buttocks with mod A for standing OT Short Term Goal 2 - Progress (Week 2): Met OT Short Term Goal 3 (Week 2): Pt will complete LB dressing tasks with mod A OT Short Term Goal 3 - Progress (Week 2): Progressing toward goal OT Short Term Goal 4 (Week 2): Pt will complete toileting tasks with max A OT Short Term Goal 4 - Progress (Week 2): Met Week 3:  OT Short Term Goal 1 (Week 3): STG=LTG secondary to ELOS  Skilled Therapeutic Interventions/Progress Updates:    OT intervention with focus on functional amb with RW, functional transfers with RW, TTB transfers, toileting, standing balance, and safety awareness. Pt practiced TTB transfers X 3 with min A and max verbal cues 2/2 visual deficits.  Sit<>stand from TTB X 4 with CGA/close supervision.  Pt requested use of toilet and returned to room and amb with RW into bathroom and used BSC to urinate. Pt required mod A for clothing management. Pt returned amb with RW back into room over to EOB. Bed mobility with supervision and min verbal cues. Pt remained in bed with all needs within reach and bed alarm activated.   Therapy Documentation Precautions:  Precautions Precautions: Fall Precaution Comments: pt legally blind in both eyes Required Braces or Orthoses: Cervical Brace Cervical Brace: Hard collar Restrictions Weight Bearing Restrictions: No  Pain:  Pt c/o L shoulder tightness; PROM and soft tissue mobilizatons   Therapy/Group: Individual Therapy  Leroy Libman 08/24/2019, 2:27 PM

## 2019-08-24 NOTE — Progress Notes (Signed)
Recreational Therapy Session Note  Patient Details  Name: Brian Daniels MRN: [id number] Date of Birth: [birth year] Today's Date: 08/24/2019 Time:  [id number] Pain: c/o left shoulder pain/tightness Skilled Therapeutic Interventions/Progress Updates: Session focused on activity tolerance, standing balance, ambulation in [place], discharge planning in regards to leisure during co-treat with PT.  Incorporated jazz music into session to help with mood and incorporate leisure/musical interests into therapies.  Pt ambulated with RW and then without AD in maxi sky with contact guard assist, forwards, backwards, side stepping.  Pt with LOB but is able to correct with min assist.  Discussed leisure interests, activity modifications, community resources, energy conservation, home safety throughout session.  Therapy/Group: Co-Treatment   [name] 08/24/2019, 3:18 PM

## 2019-08-24 NOTE — Discharge Summary (Signed)
Physician Discharge Summary  Patient ID: Brian Daniels MRN: [id number] DOB/AGE: 65-Jan-1957 65 y.o.  Admit date: 08/09/2019 Discharge date: 08/26/2019  Discharge Diagnoses:  Principal Problem:   Cervical myelopathy (HCC) Active Problems:   Weakness   Paresthesia   Urinary urgency   Neurogenic bladder   Spasticity   Blindness DVT prophylaxis Hypertension Diabetes mellitus Glaucoma/legally blind  Discharged Condition: Stable  Significant Diagnostic Studies: DG Cervical Spine 1 View  Result Date: 08/05/2019 CLINICAL DATA:  Cervical fusion EXAM: DG CERVICAL SPINE - 1 VIEW; DG C-ARM 1-60 MIN COMPARISON:  06/23/2019 FLUOROSCOPY TIME:  Fluoroscopy Time:  4 seconds Radiation Exposure Index (if provided by the fluoroscopic device): Not available Number of Acquired Spot Images: 1 FINDINGS: Single lateral spot film was obtained of the cervical spine. Interbody fusion is noted at C3-4, C4-5, C5-6 and C6-7. Anterior fixation plate is noted in satisfactory position. IMPRESSION: Status post cervical fusion from C3-C7. Electronically Signed   By: Alcide Clever M.D.   On: 08/05/2019 12:13   DG C-Arm 1-60 Min  Result Date: 08/05/2019 CLINICAL DATA:  Cervical fusion EXAM: DG CERVICAL SPINE - 1 VIEW; DG C-ARM 1-60 MIN COMPARISON:  06/23/2019 FLUOROSCOPY TIME:  Fluoroscopy Time:  4 seconds Radiation Exposure Index (if provided by the fluoroscopic device): Not available Number of Acquired Spot Images: 1 FINDINGS: Single lateral spot film was obtained of the cervical spine. Interbody fusion is noted at C3-4, C4-5, C5-6 and C6-7. Anterior fixation plate is noted in satisfactory position. IMPRESSION: Status post cervical fusion from C3-C7. Electronically Signed   By: Alcide Clever M.D.   On: 08/05/2019 12:13   VAS [place] LOWER EXTREMITY VENOUS (DVT)  Result Date: 08/11/2019  Lower Venous Study Indications: Swelling.  Risk Factors: None identified. Comparison Study: No prior studies. Performing Technologist: Chanda Busing RVT  Examination Guidelines: A complete evaluation includes B-mode imaging, spectral Doppler, color Doppler, and power Doppler as needed of all accessible portions of each vessel. Bilateral testing is considered an integral part of a complete examination. Limited examinations for reoccurring indications may be performed as noted.  +---------+---------------+---------+-----------+----------+--------------+ RIGHT    CompressibilityPhasicitySpontaneityPropertiesThrombus Aging +---------+---------------+---------+-----------+----------+--------------+ CFV      Full           Yes      Yes                                 +---------+---------------+---------+-----------+----------+--------------+ SFJ      Full                                                        +---------+---------------+---------+-----------+----------+--------------+ FV Prox  Full                                                        +---------+---------------+---------+-----------+----------+--------------+ FV Mid   Full                                                        +---------+---------------+---------+-----------+----------+--------------+  FV DistalFull                                                        +---------+---------------+---------+-----------+----------+--------------+ PFV      Full                                                        +---------+---------------+---------+-----------+----------+--------------+ POP      Full           Yes      Yes                                 +---------+---------------+---------+-----------+----------+--------------+ PTV      Full                                                        +---------+---------------+---------+-----------+----------+--------------+ PERO     Full                                                        +---------+---------------+---------+-----------+----------+--------------+    +---------+---------------+---------+-----------+----------+--------------+ LEFT     CompressibilityPhasicitySpontaneityPropertiesThrombus Aging +---------+---------------+---------+-----------+----------+--------------+ CFV      Full           Yes      Yes                                 +---------+---------------+---------+-----------+----------+--------------+ SFJ      Full                                                        +---------+---------------+---------+-----------+----------+--------------+ FV Prox  Full                                                        +---------+---------------+---------+-----------+----------+--------------+ FV Mid   Full                                                        +---------+---------------+---------+-----------+----------+--------------+ FV DistalFull                                                        +---------+---------------+---------+-----------+----------+--------------+  PFV      Full                                                        +---------+---------------+---------+-----------+----------+--------------+ POP      Full           Yes      Yes                                 +---------+---------------+---------+-----------+----------+--------------+ PTV      Full                                                        +---------+---------------+---------+-----------+----------+--------------+ PERO     Full                                                        +---------+---------------+---------+-----------+----------+--------------+     Summary: Right: There is no evidence of deep vein thrombosis in the lower extremity. No cystic structure found in the popliteal fossa. Left: There is no evidence of deep vein thrombosis in the lower extremity. No cystic structure found in the popliteal fossa.  *See table(s) above for measurements and observations. Electronically signed by  Sherald Hess MD on 08/11/2019 at 4:11:37 PM.    Final     Labs:  Basic Metabolic Panel: Recent Labs  Lab 08/22/19 0539  NA 139  K 3.7  CL 99  CO2 26  GLUCOSE 107*  BUN 16  CREATININE 0.88  CALCIUM 9.4    CBC: Recent Labs  Lab 08/22/19 0539  WBC 7.3  NEUTROABS 4.6  HGB 11.4*  HCT 34.5*  MCV 82.1  PLT 289    CBG: Recent Labs  Lab 08/24/19 2050 08/25/19 0620 08/25/19 1209 08/25/19 1640 08/25/19 2105  GLUCAP 162* 91 86 124* 129*   Family history.  Mother with hyperlipidemia as well as diabetes.  Father with hypertension.  Denies any colon cancer esophageal cancer or rectal cancer  Brief HPI:   Brian Daniels is a 64 y.o. 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  He lives with his sister 1 level home essentially wheelchair-bound since 05/30/2019.  Denies any bowel or bladder disturbances.  Patient had been followed outpatient neurology for gait abnormality.  Presented 08/05/2019 to the ED accompanied by sister with inability to get out of his chair after recent fall and reported bilateral upper and lower extremity weakness left greater than right that had progressed over the last 2 months.  Cranial CT scan negative for acute changes MRI of the brain 06/27/2019 moderate atrophy and moderate ventriculomegaly.  Moderate to severe spinal stenosis at C3-4 noted partially on sagittal views.  No acute findings noted.  Recent MRI cervical spine demonstrates significant multilevel stenosis from C3-4 through 6-7 including T2 signal change in the spinal cord.  Recent MRI completed of the brain although it did show ventriculomegaly symptoms most  consistent with cervical myelopathy rather than normal pressure hydrocephalus after being reviewed by neurosurgery.  Patient underwent discectomy C3-4 [phone] for decompression of spinal cord and exiting nerve root placement of intervertebral biomechanical device  placement of anterior instrumentation consisting of interbody plate and screws as well as arthrodesis 08/05/2019 per Dr. Kathyrn Sheriff.  Hard cervical collar when out of bed.  Therapy evaluations completed and patient was admitted for a comprehensive rehab program   Hospital Course: Brian Daniels was admitted to rehab 08/09/2019 for inpatient therapies to consist of PT, ST and OT at least three hours five days a week. Past admission physiatrist, therapy team and rehab RN have worked together to provide customized collaborative inpatient rehab.  Pertaining to patient's bilateral upper lower extremity weakness with paresthesias left greater than right gait disturbance secondary to cervical myelopathy C3 [place] C/D quadriplegia he had undergone multilevel discectomy decompression of spinal cord 08/05/2019 per Dr. Kathyrn Sheriff.  Cervical collar as directed.  Subcutaneous Lovenox for DVT prophylaxis until 10/08/2019 and venous Doppler studies negative.  Pain managed use of hydrocodone and Robaxin as needed as well as the addition of baclofen for spasticity as well as Lidoderm patch.  Blood pressure controlled on Toprol as well as Lotensin with Norvasc and he would follow-up with his primary MD.  Lipitor ongoing for hyperlipidemia.  Blood sugars overall controlled hemoglobin A1c 6.3 he remained on Glucophage.  Patient had a history of glaucoma legally blind eyedrops as advised he was using sunglasses for light sensitivity.  Neurogenic bowel and bladder PVRs as directed as well as the addition of Flomax no complaints currently.  Bowel program has been established.   Blood pressures were monitored on TID basis and controlled  Diabetes has been monitored with ac/hs CBG checks and SSI was use prn for tighter BS control.  He/ has made gains during rehab stay and is attending therapies  He/ will continue to receive follow up therapies   after discharge  Rehab course: During patient's stay in rehab weekly team conferences were  held to monitor patient's progress, set goals and discuss barriers to discharge. At admission, patient required moderate assist side-lying to sitting moderate assist for rolling mod assist ambulate 25 feet to person hand-held assist.  Total assist upper body dressing max is lower body dressing max assist toilet transfers moderate assist upper body bathing max is lower body bathing  Physical exam.  Blood pressure 144/84 pulse 93 temperature 97.5 respiration 20 oxygen saturation is 92% room air Constitutional.  Alert and oriented HEENT Head.  Normocephalic and atraumatic Eyes.  Sluggish to light Cardiac regular rate rhythm without any extra sounds or murmur heard Respiratory effort normal no respiratory distress without wheeze GI.  Soft nontender positive bowel sounds without rebound Neurological.  Right upper extremity biceps 4 out of 5 WE 4 out of 5, triceps 4+ out of 5 grip 4-5 fingers Left upper extremity biceps 2+ out of 5 triceps 2+ out of 5 grip 3 out of 5 finger 3 out of 5 Right lower extremity hip flexors 4- out of 5 knee extension 4 out of 5 dorsi flexion 4 out of 5 plantar flexion 4- out of 5 Left lower extremity hip flexors 2 out of 5 knee extension 2 out of 5 dorsi flexion 2 out of 5 plantar flexion 2 out of 5  He/  has had improvement in activity tolerance, balance, postural control as well as ability to compensate for deficits. He/ has had improvement in functional use RUE/LUE  and RLE/LLE  as well as improvement in awareness.  Working with energy conservation techniques.  Stand pivot transfers wheelchair to mat table rolling walker contact-guard assist cueing for safety.  Sessions focused on dynamic standing.  Patient able to perform sit to stand with no assistive device contact-guard assist.  Ambulates 80 feet with rest breaks as needed.  Sidesteps left to right contact-guard assist.  Exhibits good overall balance when stepping backwards with no loss of balance.  Transfers toileting  standing balance safety awareness T TB with rolling walker.  Full family teaching completed plan discharge to home       Disposition: Discharge to home    Diet: Regular  Special Instructions: No driving smoking or alcohol Cervical collar as directed  Medications at discharge 1.  Tylenol as needed 2.  Norvasc 10 mg p.o. daily 3.  Aspirin 81 mg p.o. daily 4.  Lipitor 20 mg p.o. daily 5.  Baclofen 5 mg p.o. 3 times daily 6.  Lotensin 10 mg p.o. daily 7.  Dulcolax suppository daily as needed 8.  Alphagan ophthalmic solution 1 drop both eyes 3 times daily 9.  Voltaren gel 2 g 4 times a day to affected area 10.  Cymbalta 60 mg p.o. nightly 11.  Hydrocodone 1 tablet every 4 hours as needed pain 12.  Lidoderm patch 2 patches change as directed 13.  Glucophage 500 mg p.o. nightly 14.  Robaxin 500 mg every 6 hours as needed muscle spasms 15.  Toprol-XL 25 mg p.o. daily 16.  Prednisone forte 1% 1 drop right eye twice daily 17.  Senokot-S 2 tablets twice daily 18.  Flomax 0.4 mg daily 19.  Timoptic ophthalmic solution 1 drop both eyes twice daily 20.Lovenox 40 mg daily until 10/08/2019 and stop  Discharge Instructions    Ambulatory referral to Physical Medicine Rehab   Complete by: As directed    Moderate complexity follow-up 1 to 2 weeks cervical myelopathy      Follow-up Information    Lovorn, Aundra Millet, MD Follow up.   Specialty: Physical Medicine and Rehabilitation Why: Office to call for appointment Contact information: [address] [place] [address] [phone]        Lisbeth Renshaw, MD Follow up.   Specialty: Neurosurgery Why: Call for appointment Contact information: [address] [place] [address] [phone]           Signed: Charlton Amor 08/26/2019, 5:29 AM

## 2019-08-24 NOTE — Progress Notes (Signed)
Physical Therapy Session Note  Patient Details  Name: Brian Daniels MRN: [id number] Date of Birth: [birth year]  Today's Date: 08/24/2019 PT Individual Time: 0900-1015 PT Individual Time Calculation (min): 75 min   Short Term Goals: Week 2:  PT Short Term Goal 1 (Week 2): Pt will complete transfers with min A consistently PT Short Term Goal 2 (Week 2): Pt will ambulate x 75 ft with LRAD and min A PT Short Term Goal 3 (Week 2): Pt will propel manual w/c x 150 ft at Supervision level  Skilled Therapeutic Interventions/Progress Updates:    Pt received seated in w/c in room, agreeable to PT session. Pt reports increase in L shoulder tightness and soreness this AM, to be addressed in PM session with regards to [facility]. Stand pivot transfer w/c to mat table with RW and CGA, cueing for safety. Session focus on dynamic standing balance and BLE NMR with no UE support while in body weight supported overweight tracking system of maxi sky. Pt is able to perform sit to stand with no AD and CGA. Ambulation x 40 ft, x 80 ft in tracking body weight supported system with no UE support. Pt has several minor LOB posteriorly that he is able to correct with min A. Discussed use of balance strategies for balance recovery. Sidesteps L/R x 20 ft each while in body weight supported system. Pt exhibits decreased balance and step-length when side-stepping to the R due to decreased tolerance for stance on LLE. Pt exhibits increase in B knee flexion with onset of fatigue. Ambulation x 20 ft forwards then 20 ft backwards x 2 reps in body weight supported system. Pt exhibits overall good balance when stepping backwards with no LOB noted. Sit to stand 2 x 5 reps from mat table with no UE support outside of body weight supported system, CGA. Pt exhibits some anterior lean initially in standing but is able to correct with min cueing. Stand pivot transfer back to w/c with RW and CGA. Pt left seated in w/c in room with needs in reach, quick  release belt in place at end of session. Cotreatment session with recreational therapy with use of jazz music during session for improved patient mood.  Therapy Documentation Precautions:  Precautions Precautions: Fall Precaution Comments: pt legally blind in both eyes Required Braces or Orthoses: Cervical Brace Cervical Brace: Hard collar Restrictions Weight Bearing Restrictions: No    Therapy/Group: Individual Therapy   Peter Congo, PT, DPT  08/24/2019, 12:40 PM

## 2019-08-24 NOTE — Progress Notes (Signed)
[facility] PHYSICAL MEDICINE & REHABILITATION PROGRESS NOTE   Subjective/Complaints:  Pt reports had BM this AM- getting into a "habit"- no issues currently- had shower this AM and did well.   ROS: patient denies SOB, CP, abd pain, vision changes (is blind), sleep issues.  N/V/D/Constipation.   Objective:   No results found. Recent Labs    08/22/19 0539  WBC 7.3  HGB 11.4*  HCT 34.5*  PLT 289   Recent Labs    08/22/19 0539  NA 139  K 3.7  CL 99  CO2 26  GLUCOSE 107*  BUN 16  CREATININE 0.88  CALCIUM 9.4    Intake/Output Summary (Last 24 hours) at 08/24/2019 0748 Last data filed at 08/24/2019 0500 Gross per 24 hour  Intake 240 ml  Output 1215 ml  Net -975 ml     Physical Exam: Vital Signs Blood pressure (!) 147/82, pulse 70, temperature 98.3 F (36.8 C), resp. rate 15, height 5\' 10"  (1.778 m), weight 76.1 kg, SpO2 97 %.  Constitutional: awake, alert, appropriate; sitting up in shower, just had BM, NAD HENT:  Head: Normocephalic and atraumatic.  Eyes: not wearing sunglasses since in shower   Neck:  ACDF incision site clean and dry with dry dressing- incision looks great- glued, not stapled; not wearing cervical collar since in bed Decreased cervical rotation; less tight trigger points in L upper traps, levators and scalenes as well as rhomboids- more on L side. TTP down to distal biceps on L arm Cardiovascular: RRR  Respiratory: CTA B/L no W/R/R; no coughing heard  GI; soft, NT, ND, (+)BS-  Musculoskeletal:   Neck supple. No swelling anymore of LUE- nml size/swelling of RUE/hand  Comments: RUE- bicep 4/5, WE 4/5, triceps 4+/5, grip 4-/5, finger 4+/5 LUE- Biceps 3/5, WE 2+/5, triceps 3/5, grip 3/5, finger 3/5. No tenderness over left shoulder joint. RLE- HF 4-/5, KE 4/5, DF 4+/5, PF 4-/5 LLE- HF 2/5, KE 2/5, DF 2/5, PF 2-/5 Neurological:  Hoffman's LUE- strong; (-) Hoffman's in RUE;  no clonus in LEs B/L Sensation intact at C3 B/L; otherwise decreased at  every dermatome to L3 B/L ; L4-S2 dermatomes B/L very decreased sensation- same S3-S5 B/L Psychiatric:  Very appropriate and pleasant    Assessment/Plan: 1. Functional deficits secondary to C3 ASIA C/D quadriplegia  which require 3+ hours per day of interdisciplinary therapy in a comprehensive inpatient rehab setting.  Physiatrist is providing close team supervision and 24 hour management of active medical problems listed below.  Physiatrist and rehab team continue to assess barriers to discharge/monitor patient progress toward functional and medical goals  Care Tool:  Bathing    Body parts bathed by patient: Right arm, Left arm, Chest, Abdomen, Front perineal area, Right upper leg, Left upper leg, Right lower leg, Face   Body parts bathed by helper: Right arm, Buttocks, Left lower leg     Bathing assist Assist Level: Minimal Assistance - Patient > 75%     Upper Body Dressing/Undressing Upper body dressing   What is the patient wearing?: Pull over shirt    Upper body assist Assist Level: Minimal Assistance - Patient > 75%    Lower Body Dressing/Undressing Lower body dressing      What is the patient wearing?: Incontinence brief, Pants     Lower body assist Assist for lower body dressing: Moderate Assistance - Patient 50 - 74%     Toileting Toileting    Toileting assist Assist for toileting: Total Assistance - Patient <  25%     Transfers Chair/bed transfer  Transfers assist     Chair/bed transfer assist level: Contact Guard/Touching assist     Locomotion Ambulation   Ambulation assist   Ambulation activity did not occur: Safety/medical concerns  Assist level: Minimal Assistance - Patient > 75% Assistive device: Walker-rolling Max distance: 150'   Walk 10 feet activity   Assist  Walk 10 feet activity did not occur: Safety/medical concerns  Assist level: Contact Guard/Touching assist Assistive device: Walker-rolling   Walk 50 feet  activity   Assist Walk 50 feet with 2 turns activity did not occur: Safety/medical concerns  Assist level: Minimal Assistance - Patient > 75% Assistive device: Walker-rolling    Walk 150 feet activity   Assist Walk 150 feet activity did not occur: Safety/medical concerns  Assist level: Minimal Assistance - Patient > 75% Assistive device: Walker-rolling    Walk 10 feet on uneven surface  activity   Assist Walk 10 feet on uneven surfaces activity did not occur: Safety/medical concerns         Wheelchair     Assist Will patient use wheelchair at discharge?: Yes Type of Wheelchair: Manual Wheelchair activity did not occur: Safety/medical concerns  Wheelchair assist level: Minimal Assistance - Patient > 75% Max wheelchair distance: 150'    Wheelchair 50 feet with 2 turns activity    Assist    Wheelchair 50 feet with 2 turns activity did not occur: Safety/medical concerns   Assist Level: Minimal Assistance - Patient > 75%   Wheelchair 150 feet activity     Assist  Wheelchair 150 feet activity did not occur: Safety/medical concerns   Assist Level: Minimal Assistance - Patient > 75%   Blood pressure (!) 147/82, pulse 70, temperature 98.3 F (36.8 C), resp. rate 15, height 5\' 10"  (1.778 m), weight 76.1 kg, SpO2 97 %.    Medical Problem List and Plan:  1. Bilateral upper and lower extremity weakness with paresthesias left greater than right with gait disturbance secondary to cervical myelopathy.S/P multilevel discectomy C3-4, 4-5, 5-6 and C6-7 with decompression of spinal cord and exiting nerve roots with arthrodesis 08/05/2019 per Dr. 08/07/2019. HARD Cervical collar when out of bed.  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  -patient may Shower if covers neck incision   Continue CIR PT, OT -ELOS/Goals: Supervision to Min assist - 3-3.5 weeks due to SCI   2/1- will remove honeycomb dressing and place daily dry  dressing  2/5- looking good, incision wise  2/6- making sensory and moror gains basically daily.   2/7- goals increased 2/6 due to great progress 2. Antithrombotics:  -DVT/anticoagulation: SCDs. Discuss Lovenox for DVT prophylaxis with neurosurgery. Check vascular study   1/27- will start lovenox due to increased risk of DVT -antiplatelet therapy: Aspirin 81 mg to begin 08/12/2019  3. Pain Management: Post op pain, nerve pain and spasticity- Hydrocodone/Robaxin as needed  -will try and change to Baclofen for muscle spasms/spasticity once gets to CIR and possibly add Duloxetine for nerve pain   1/27- added baclofen 5 mg TID for spasticity and Duloxetine 30 mg QHS for nerve pain  1/28- add Lidoderm patches to L neck during day. 12 hrs on;12 hrs off  1/29- will increase Cymbalta on Monday. Lidoderm effective for L shoulder pain.   1/30: pain is well controlled  1/31: Complains of some pain over left shoulder. Lidocaine patch on. Not tender to palpation. Likely secondary to scapular destablization given left upper extremity hemiparesis. Ordered  diclofenac gel 4 times daily prn for pain.   2/1- will increase Cymbalta to 60 mg daily.   2/8: pain well controlled 4. Mood: Provide emotional support  -antipsychotic agents: N/A  5. Neuropsych: This patient is capable of making decisions on his own behalf.  6. Skin/Wound Care: Routine skin checks  7. Fluids/Electrolytes/Nutrition: Routine in and outs with follow-up chemistries  8. Hypertension. Toprol-XL 25 mg daily, Lotensin 10 mg daily, Norvasc 5 mg daily. Monitor with increased mobility   1/30: Uncontrolled on last several reads. Will increase Norvasc to 10mg  daily.   2/8: Reviewed flowsheet. Elevated this morning and last night but low yesterday afternoon so will not increase medication dose. Continue to monitor.  9. Diabetes mellitus. Hemoglobin A1c 6.3. Glucophage 500 mg daily. Check blood sugars before meals and at bedtime   CBG (last 3)   Recent Labs    08/23/19 1716 08/23/19 2010 08/24/19 0505  GLUCAP 81 155* 93    2/2-- BGs well controlled - 99-129  2/3- BGs 70-80- with 1 of 225- won't increase regimen anymore.  2/7- BGs 91-115- con't regimen.   2/8: well controlled. Continue to monitor.   10. Glaucoma/legally blind. Continue eyedrops as directed- continue pt's dark sunglasses for light sensitivity.  11. Hyperlipidemia. Lipitor  12. Constipation in setting of Neurogenic bowel. Adjust bowel program as needed.  1/28- LBM early AM 1/27  2/2- LBM Saturday- 4 days- will give Mg citrate and follow with suppository if needed.   2/3- had good sized BM yesterday without mg citrate.   2/4- LBM this AM  2/6- stop Colace BID; con't Senokot for now and monitor for loose stools 13. Neurogenic bladder- will check PVRS to make sure is emptying since needed a in/out cath due to lack of voiding 2 days ago.  1/27- ordered PVRs  1/29- added Flomax 0.4mg  qsupper since doesn't feel like emptying.   2/2- dribbling- will monitor  2/6- no complaints currently  14/ Hypokalemia  1/27- K+ 3.4- will replete  1/28- labs in AM  1/29- K+ 3.8  2/1- check labs in AM  2/2- K+ 3.7 15. Anemia: 2/8 Hgb is 11.4, stable from 6 days ago.  16. Cough-  2/9- added Robitussin-DM q4 hours prn  LOS: 15 days A FACE TO FACE EVALUATION WAS PERFORMED  Sabrina Arriaga 08/24/2019, 7:48 AM

## 2019-08-24 NOTE — Progress Notes (Signed)
Pt checked qh for safety. Bed low with fall alarm on and call bell within reach.  

## 2019-08-24 NOTE — Progress Notes (Addendum)
Occupational Therapy Treatment and Discharge Summary  Patient Details  Name: Brian Daniels MRN: [id number] Date of Birth: [birth year]  11:00-12:00 Family education with pt and pt's sister on self care tasks including functional ambulation in and out bathroom with RW, toileting with 3:1 over commode, tub bench transfers into the tub, showering sit to stand and dressing and care for c -collar. Pt able to perform at his usual performance of min A with cues for vision loss. Pt's sister able to provide level of care pt needs. No pain in session.   Patient has met 13 of 13 long term goals due to improved activity tolerance, improved balance, postural control, ability to compensate for deficits, functional use of  RIGHT upper, RIGHT lower, LEFT upper and LEFT lower extremity and improved coordination. Pt made steady progress with BADLs and functional transfers during this admission.  Pt performs toilet and TTB tranfsers using RW with CGA and max verbal cues secondary to visual deficits.  Pt completes bathing tasks with min A.  Pt completes UB dressing tasks with min A and LB dressing with mod A. Pt requires mod A for toileting tasks. Pt is using his LUE at a diminished level during BADLs.  Patient to discharge at [facility] Assist level.  Patient's care partner is independent to provide the necessary physical assistance at discharge.    Reasons goals not met: n/a  Recommendation:  Patient will benefit from ongoing skilled OT services in home health setting to continue to advance functional skills in the area of BADL and Reduce care partner burden.  Equipment: No equipment provided  Pt's sister to purchase TTB  Reasons for discharge: treatment goals met and discharge from hospital  Patient/family agrees with progress made and goals achieved: Yes  OT Discharge Vision Baseline Vision/History: Legally blind Patient Visual Report: No change from baseline Perception  Perception: Within Functional  Limits Praxis Praxis: Intact Cognition Overall Cognitive Status: Within Functional Limits for tasks assessed Arousal/Alertness: Awake/alert Orientation Level: Oriented X4 Attention: Sustained Focused Attention: Appears intact Sustained Attention: Appears intact Memory: Appears intact Immediate Memory Recall: Sock;Blue;Bed Memory Recall Sock: Without Cue Memory Recall Blue: Without Cue Memory Recall Bed: Without Cue Awareness: Appears intact Problem Solving: Appears intact Safety/Judgment: Appears intact Sensation Sensation Light Touch Impaired Details: Impaired RUE;Impaired LUE;Impaired RLE;Impaired LLE Hot/Cold: Appears Intact Proprioception: Not tested Stereognosis: Impaired by gross assessment Additional Comments: Pt can feel light touch on extremities but says they feel numb with tingling Coordination Gross Motor Movements are Fluid and Coordinated: No Fine Motor Movements are Fluid and Coordinated: No Coordination and Movement Description: impaired 2/2 incomplete tetraplegia Finger Nose Finger Test: slow on R, unable to on L Motor  Motor Motor: Tetraplegia Motor - Skilled Clinical Observations: incomplete tetraplegia     Trunk/Postural Assessment  Cervical Assessment Cervical Assessment: Exceptions to WFL(cervical collar when OOB) Thoracic Assessment Thoracic Assessment: (rounded shoulders) Lumbar Assessment Lumbar Assessment: (posterior pelvic tilt)  Balance Static Sitting Balance Static Sitting - Balance Support: Feet supported Static Sitting - Level of Assistance: 5: Stand by assistance Dynamic Sitting Balance Dynamic Sitting - Balance Support: During functional activity;Feet supported Dynamic Sitting - Level of Assistance: 5: Stand by assistance Extremity/Trunk Assessment RUE Assessment Passive Range of Motion (PROM) Comments: did not assess full shoulder ROM due to recent ACDF Active Range of Motion (AROM) Comments: shoulder flex and abd 80; distal  WFL General Strength Comments: functional in elbow and hand - grasp 4+/5 LUE Assessment Passive Range of Motion (PROM) Comments: did not fully  assess due to ACDF Active Range of Motion (AROM) Comments: ~80 degrees flexion and abduction; WFL adduction General Strength Comments: 2+ shoulder flexion; 3+ elbow flexion, 3+ grasp LUE Body System: Ortho LUE AROM (degrees) Left Shoulder Flexion: 80 Degrees Left Shoulder ABduction: 80 Degrees Left Composite Finger Extension: 75% Left Composite Finger Flexion: 75% LUE PROM (degrees) Left Shoulder ABduction: 90 Degrees LUE Strength Left Wrist Extension: 3+/5   Leroy Libman 08/24/2019, 3:07 PM

## 2019-08-24 NOTE — Progress Notes (Signed)
Occupational Therapy Session Note  Patient Details  Name: Brian Daniels MRN: [id number] Date of Birth: 1956-04-09  Today's Date: 08/24/2019 OT Individual Time: 0700-0810 OT Individual Time Calculation (min): 70 min    Short Term Goals: Week 3:  OT Short Term Goal 1 (Week 3): STG=LTG secondary to ELOS  Skilled Therapeutic Interventions/Progress Updates:    OT intervention with focus on bed mobility, sit<>stand, standing balance, functional amb with RW, BADL retraining, activity tolerance, and safety awareness to increase independence with BADLs. See Care Tool for assist levels with bathing/dressing/toileting. Pt amb with RW into bathroom with CGA to transfer to toilet and then to shower for bathing tasks.  Pt requires assistance with B lower legs but is able to reach to buttocks with steady A for standing balance.  Pt returned to room and completed dressing with sit<>stand from w/c. Pt able to use BUE to assist with pulling pants over hips. Pt requires assistance with socks and shoes. Pt remained seated in w/c with all needs within reach. Belt alarm activated.   Therapy Documentation Precautions:  Precautions Precautions: Fall Precaution Comments: pt legally blind in both eyes Required Braces or Orthoses: Cervical Brace Cervical Brace: Hard collar Restrictions Weight Bearing Restrictions: No  Pain: Pt states his LUE feels better but with discomfort, especially in the morning; activity and emotional support  Therapy/Group: Individual Therapy  Rich Brave 08/24/2019, 8:11 AM

## 2019-08-24 NOTE — Progress Notes (Signed)
Social Work Patient ID: Brian Daniels, male   DOB: 09-Nov-1955, 64 y.o.   MRN: [id number]   SW Received phone call from [facility] RN/CM [phone]) reporting pt has been approved through Feb 15th with d/c on Feb 16th. SW did inform on anticipated d/c this weeked (ED 2/13). She asked to follow up to inform when pt discharges so they can update his  authorization.   Cecile Sheerer, LCSWA Office (preferred): [phone] Cell: [phone] Fax: [phone]

## 2019-08-24 NOTE — Progress Notes (Signed)
Social Work Patient ID: Brian Daniels, male   DOB: 1956/03/27, 64 y.o.   MRN: [id number]   SW spoke with sister [name] to review preferred [facility]: [facility] and [facility]. SW informed will follow-up once there is more information. SW reiterated will need to purchase a RW as insurance will not cover since pt has a w/c. SW discussed online options Pt sister asked about [place] [employer] [facility] (transportation) and SW informed on length of process. SW will provide an application so she can submit for pt. SW also informed medical team agreeable to discharge on Friday 2/12 (at pt sister request to avoid arranging transportation on Saturday).   *HHPT/OT referral accepted by Becky Sax with [facility] ([phone]/f:[phone]). SW informed pt sister on above about [facility].    Cecile Sheerer, MSW, LCSWA Office (preferred): [phone] Cell: [phone] Fax: [phone]

## 2019-08-25 ENCOUNTER — Inpatient Hospital Stay (HOSPITAL_COMMUNITY): Payer: Medicare Other | Admitting: Physical Therapy

## 2019-08-25 ENCOUNTER — Encounter (HOSPITAL_COMMUNITY): Payer: Medicare Other | Admitting: Occupational Therapy

## 2019-08-25 ENCOUNTER — Ambulatory Visit (HOSPITAL_COMMUNITY): Payer: Medicare Other | Admitting: Physical Therapy

## 2019-08-25 LAB — GLUCOSE, CAPILLARY
Glucose-Capillary: 91 mg/dL (ref 70–99)
Glucose-Capillary: 86 mg/dL (ref 70–99)
Glucose-Capillary: 124 mg/dL — ABNORMAL HIGH (ref 70–99)
Glucose-Capillary: 129 mg/dL — ABNORMAL HIGH (ref 70–99)

## 2019-08-25 MED ORDER — METHOCARBAMOL 1000 MG/10ML IJ SOLN: 500.0000 mg | Freq: Four times a day (QID) | INTRAVENOUS | Status: DC | PRN

## 2019-08-25 MED ORDER — METHOCARBAMOL 500 MG PO TABS: 500.0000 mg | ORAL_TABLET | Freq: Four times a day (QID) | ORAL | Status: DC | PRN

## 2019-08-25 MED ORDER — LIDOCAINE 5 % EX PTCH: 2.0000 | MEDICATED_PATCH | CUTANEOUS | 0 refills | Status: DC

## 2019-08-25 MED ORDER — METOPROLOL SUCCINATE ER 25 MG PO TB24: 25.0000 mg | ORAL_TABLET | Freq: Every day | ORAL | 1 refills | Status: DC

## 2019-08-25 MED ORDER — SENNA 8.6 MG PO TABS: 2.0000 | ORAL_TABLET | Freq: Two times a day (BID) | ORAL | 0 refills | Status: DC

## 2019-08-25 MED ORDER — METFORMIN HCL ER 500 MG PO TB24: 500.0000 mg | ORAL_TABLET | Freq: Every day | ORAL | 1 refills | Status: DC

## 2019-08-25 MED ORDER — HYDROCODONE-ACETAMINOPHEN 5-325 MG PO TABS: 1.0000 | ORAL_TABLET | ORAL | 0 refills | Status: DC | PRN

## 2019-08-25 MED ORDER — ACETAMINOPHEN 325 MG PO TABS: 650.0000 mg | ORAL_TABLET | ORAL | Status: DC | PRN

## 2019-08-25 MED ORDER — ENOXAPARIN SODIUM 40 MG/0.4ML ~~LOC~~ SOLN: SUBCUTANEOUS | 2 refills | Status: DC

## 2019-08-25 MED ORDER — AMLODIPINE BESYLATE 10 MG PO TABS: 10.0000 mg | ORAL_TABLET | Freq: Every day | ORAL | 1 refills | Status: DC

## 2019-08-25 MED ORDER — DICLOFENAC SODIUM 1 % EX GEL: 2.0000 g | Freq: Four times a day (QID) | CUTANEOUS | 1 refills | Status: DC | PRN

## 2019-08-25 MED ORDER — ATORVASTATIN CALCIUM 20 MG PO TABS: 20.0000 mg | ORAL_TABLET | Freq: Every day | ORAL | 1 refills | Status: AC

## 2019-08-25 MED ORDER — BACLOFEN 5 MG PO TABS: 5.0000 mg | ORAL_TABLET | Freq: Three times a day (TID) | ORAL | 1 refills | Status: DC

## 2019-08-25 MED ORDER — DULOXETINE HCL 60 MG PO CPEP: 60.0000 mg | ORAL_CAPSULE | Freq: Every day | ORAL | 3 refills | Status: DC

## 2019-08-25 MED ORDER — TAMSULOSIN HCL 0.4 MG PO CAPS: 0.4000 mg | ORAL_CAPSULE | Freq: Every day | ORAL | 1 refills | Status: DC

## 2019-08-25 MED ORDER — BENAZEPRIL HCL 10 MG PO TABS: 10.0000 mg | ORAL_TABLET | Freq: Every day | ORAL | 1 refills | Status: AC

## 2019-08-25 MED ORDER — BISACODYL 10 MG RE SUPP: 10.0000 mg | Freq: Every day | RECTAL | 0 refills | Status: DC | PRN

## 2019-08-25 MED ORDER — METHOCARBAMOL 500 MG PO TABS: 500.0000 mg | ORAL_TABLET | Freq: Four times a day (QID) | ORAL | 0 refills | Status: DC | PRN

## 2019-08-25 NOTE — Progress Notes (Signed)
Social Work Patient ID: Brian Daniels, male   DOB: 1955-12-10, 64 y.o.   MRN: [id number]    SW followed up with pt sister [name] to inform [facility] application left in pt room. She reports that she purchased a RW. She asked if insurance would cover a TTB. SW explained that it will not be covered under insurance. She intends to get guidance from [facility] for further recommendations.   Cecile Sheerer, MSW, LCSWA Office (preferred): [phone] Cell: [phone] Fax: [phone]

## 2019-08-25 NOTE — Progress Notes (Signed)
[place] PHYSICAL MEDICINE & REHABILITATION PROGRESS NOTE   Subjective/Complaints:  Needs to have BM- L shoulder pain is about the same. Wants to try another medicine.  Hand numbness is better.     ROS: patient denies SOB, CP, abd pain, vision changes (is blind), sleep issues.  N/V/D/Constipation.   Objective:   No results found. No results for input(s): WBC, HGB, HCT, PLT in the last 72 hours. No results for input(s): NA, K, CL, CO2, GLUCOSE, BUN, CREATININE, CALCIUM in the last 72 hours.  Intake/Output Summary (Last 24 hours) at 08/25/2019 0830 Last data filed at 08/25/2019 0500 Gross per 24 hour  Intake 480 ml  Output 825 ml  Net -345 ml     Physical Exam: Vital Signs Blood pressure 129/78, pulse 66, temperature 98.2 F (36.8 C), temperature source Oral, resp. rate 17, height 5\' 10"  (1.778 m), weight 76.1 kg, SpO2 100 %.  Constitutional: awake, alert, appropriate; laying in bed; wearing dark  sunglasses,  Rubbing L shoulderNAD HENT:  Head: Normocephalic and atraumatic.  Eyes: not wearing sunglasses since in shower   Neck:  ACDF incision site clean and dry with dry dressing- incision looks great- glued, not stapled; not wearing cervical collar since in bed  Cardiovascular: RRR, no M/R/G Respiratory: CTA B/L no W/R/R;   GI; soft, NT, ND, (+)BS-  Musculoskeletal:   Neck muscles tight- esp scalenes, L upper traps, L levators, and L pecs and deltoid- all tight- TTP Comments: RUE- bicep 4/5, WE 4/5, triceps 4+/5, grip 4-/5, finger 4+/5 LUE- Biceps 3/5, WE 2+/5, triceps 3/5, grip 3/5, finger 3/5. No tenderness over left shoulder joint. RLE- HF 4-/5, KE 4/5, DF 4+/5, PF 4-/5 LLE- HF 2/5, KE 2/5, DF 2/5, PF 2-/5 Neurological:  Hoffman's LUE- strong; (-) Hoffman's in RUE;  no clonus in LEs B/L; less sensory decrease C5-T1 B/L Sensation intact at C3 B/L; otherwise decreased at every dermatome to L3 B/L ; L4-S2 dermatomes B/L very decreased sensation- same S3-S5  B/L Psychiatric:  Very appropriate and pleasant    Assessment/Plan: 1. Functional deficits secondary to C3 ASIA C/D quadriplegia  which require 3+ hours per day of interdisciplinary therapy in a comprehensive inpatient rehab setting.  Physiatrist is providing close team supervision and 24 hour management of active medical problems listed below.  Physiatrist and rehab team continue to assess barriers to discharge/monitor patient progress toward functional and medical goals  Care Tool:  Bathing    Body parts bathed by patient: Right arm, Left arm, Chest, Abdomen, Front perineal area, Right upper leg, Left upper leg, Right lower leg, Face, Buttocks   Body parts bathed by helper: Right arm, Buttocks, Left lower leg     Bathing assist Assist Level: Minimal Assistance - Patient > 75%     Upper Body Dressing/Undressing Upper body dressing   What is the patient wearing?: Pull over shirt    Upper body assist Assist Level: Minimal Assistance - Patient > 75%    Lower Body Dressing/Undressing Lower body dressing      What is the patient wearing?: Incontinence brief, Pants     Lower body assist Assist for lower body dressing: Moderate Assistance - Patient 50 - 74%     Toileting Toileting    Toileting assist Assist for toileting: Moderate Assistance - Patient 50 - 74%     Transfers Chair/bed transfer  Transfers assist     Chair/bed transfer assist level: Contact Guard/Touching assist     Locomotion Ambulation   Ambulation assist  Ambulation activity did not occur: Safety/medical concerns  Assist level: Minimal Assistance - Patient > 75% Assistive device: Walker-rolling Max distance: 150'   Walk 10 feet activity   Assist  Walk 10 feet activity did not occur: Safety/medical concerns  Assist level: Contact Guard/Touching assist Assistive device: Walker-rolling   Walk 50 feet activity   Assist Walk 50 feet with 2 turns activity did not occur:  Safety/medical concerns  Assist level: Minimal Assistance - Patient > 75% Assistive device: Walker-rolling    Walk 150 feet activity   Assist Walk 150 feet activity did not occur: Safety/medical concerns  Assist level: Minimal Assistance - Patient > 75% Assistive device: Walker-rolling    Walk 10 feet on uneven surface  activity   Assist Walk 10 feet on uneven surfaces activity did not occur: Safety/medical concerns         Wheelchair     Assist Will patient use wheelchair at discharge?: Yes Type of Wheelchair: Manual Wheelchair activity did not occur: Safety/medical concerns  Wheelchair assist level: Minimal Assistance - Patient > 75% Max wheelchair distance: 150'    Wheelchair 50 feet with 2 turns activity    Assist    Wheelchair 50 feet with 2 turns activity did not occur: Safety/medical concerns   Assist Level: Minimal Assistance - Patient > 75%   Wheelchair 150 feet activity     Assist  Wheelchair 150 feet activity did not occur: Safety/medical concerns   Assist Level: Minimal Assistance - Patient > 75%   Blood pressure 129/78, pulse 66, temperature 98.2 F (36.8 C), temperature source Oral, resp. rate 17, height 5\' 10"  (1.778 m), weight 76.1 kg, SpO2 100 %.    Medical Problem List and Plan:  1. Bilateral upper and lower extremity weakness with paresthesias left greater than right with gait disturbance secondary to cervical myelopathy.S/P multilevel discectomy C3-4, 4-5, 5-6 and C6-7 with decompression of spinal cord and exiting nerve roots with arthrodesis 08/05/2019 per Dr. 08/07/2019. HARD Cervical collar when out of bed.  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  -patient may Shower if covers neck incision   Continue CIR PT, OT -ELOS/Goals: Supervision to Min assist - 3-3.5 weeks due to SCI   2/1- will remove honeycomb dressing and place daily dry dressing  2/5- looking good, incision wise  2/6- making  sensory and moror gains basically daily.   2/7- goals increased 2/6 due to great progress 2. Antithrombotics:  -DVT/anticoagulation: SCDs. Discuss Lovenox for DVT prophylaxis with neurosurgery. Check vascular study   1/27- will start lovenox due to increased risk of DVT -antiplatelet therapy: Aspirin 81 mg to begin 08/12/2019  3. Pain Management: Post op pain, nerve pain and spasticity- Hydrocodone/Robaxin as needed  -will try and change to Baclofen for muscle spasms/spasticity once gets to CIR and possibly add Duloxetine for nerve pain   1/27- added baclofen 5 mg TID for spasticity and Duloxetine 30 mg QHS for nerve pain  1/28- add Lidoderm patches to L neck during day. 12 hrs on;12 hrs off  1/29- will increase Cymbalta on Monday. Lidoderm effective for L shoulder pain.   1/30: pain is well controlled  1/31: Complains of some pain over left shoulder. Lidocaine patch on. Not tender to palpation. Likely secondary to scapular destablization given left upper extremity hemiparesis. Ordered diclofenac gel 4 times daily prn for pain.   2/1- will increase Cymbalta to 60 mg daily.   2/8: pain well controlled  2/11- willl try Robaxin 500 mg QID  prn for L shoulder tightness 4. Mood: Provide emotional support  -antipsychotic agents: N/A  5. Neuropsych: This patient is capable of making decisions on his own behalf.  6. Skin/Wound Care: Routine skin checks  7. Fluids/Electrolytes/Nutrition: Routine in and outs with follow-up chemistries  8. Hypertension. Toprol-XL 25 mg daily, Lotensin 10 mg daily, Norvasc 5 mg daily. Monitor with increased mobility   1/30: Uncontrolled on last several reads. Will increase Norvasc to 10mg  daily.   2/8: Reviewed flowsheet. Elevated this morning and last night but low yesterday afternoon so will not increase medication dose. Continue to monitor.  9. Diabetes mellitus. Hemoglobin A1c 6.3. Glucophage 500 mg daily. Check blood sugars before meals and at bedtime   CBG (last 3)   Recent Labs    08/24/19 1636 08/24/19 2050 08/25/19 0620  GLUCAP 96 162* 91    2/2-- BGs well controlled - 99-129  2/3- BGs 70-80- with 1 of 225- won't increase regimen anymore.  2/7- BGs 91-115- con't regimen.   2/8: well controlled. Continue to monitor.   10. Glaucoma/legally blind. Continue eyedrops as directed- continue pt's dark sunglasses for light sensitivity.  11. Hyperlipidemia. Lipitor  12. Constipation in setting of Neurogenic bowel. Adjust bowel program as needed.  1/28- LBM early AM 1/27  2/2- LBM Saturday- 4 days- will give Mg citrate and follow with suppository if needed.   2/3- had good sized BM yesterday without mg citrate.   2/4- LBM this AM  2/6- stop Colace BID; con't Senokot for now and monitor for loose stools  2/11- needs to have BM- will speak with nursing if needs prns 13. Neurogenic bladder- will check PVRS to make sure is emptying since needed a in/out cath due to lack of voiding 2 days ago.  1/27- ordered PVRs  1/29- added Flomax 0.4mg  qsupper since doesn't feel like emptying.   2/2- dribbling- will monitor  2/6- no complaints currently  14/ Hypokalemia  1/27- K+ 3.4- will replete  1/28- labs in AM  1/29- K+ 3.8  2/1- check labs in AM  2/2- K+ 3.7 15. Anemia: 2/8 Hgb is 11.4, stable from 6 days ago.  16. Cough-  2/9- added Robitussin-DM q4 hours prn 17/ Dispo  2/11- d/c tomorrow after lunch  LOS: 16 days A FACE TO FACE EVALUATION WAS PERFORMED  Trypp Heckmann 08/25/2019, 8:30 AM

## 2019-08-25 NOTE — Progress Notes (Signed)
Physical Therapy Discharge Summary  Patient Details  Name: Brian Daniels MRN: [id number] Date of Birth: [birth year]  Today's Date: 08/25/2019  Patient has met 7 of 8 long term goals due to improved activity tolerance, improved balance, improved postural control, increased strength and ability to compensate for deficits.  Patient to discharge at an ambulatory level CGA to [place].   Patient's care partner is independent to provide the necessary physical assistance at discharge. Pt's sister Brian Daniels has completed hands-on family education and is safe to assist pt upon d/c home.  Reasons goals not met: Pt has met or exceeded the majority of therapy goals, did not meet Supervision level for bed mobility as he requires CGA. Pt's sister is safe to provide this level of assist for him at home.  Recommendation:  Patient will benefit from ongoing skilled PT services in home health setting to continue to advance safe functional mobility, address ongoing impairments in L NMR, balance, coordination, strength, safety, independence with functional mobility, and minimize fall risk.  Equipment: No equipment provided  Reasons for discharge: treatment goals met and discharge from hospital  Patient/family agrees with progress made and goals achieved: Yes  PT Discharge Precautions/Restrictions Precautions Precautions: Fall;Cervical Precaution Comments: pt legally blind in both eyes Required Braces or Orthoses: Cervical Brace Cervical Brace: Hard collar;Other (comment)(when OOB) Restrictions Weight Bearing Restrictions: No Vision/Perception  Vision - History Baseline Vision: Legally blind Perception Perception: Within Functional Limits Praxis Praxis: Intact  Cognition Overall Cognitive Status: Within Functional Limits for tasks assessed Arousal/Alertness: Awake/alert Orientation Level: Oriented X4 Attention: Sustained Focused Attention: Appears intact Sustained Attention: Appears intact Memory:  Appears intact Awareness: Appears intact Problem Solving: Appears intact Safety/Judgment: Appears intact Sensation Sensation Light Touch: Impaired Detail Light Touch Impaired Details: (impaired BLE L>R) Proprioception: Impaired Detail Proprioception Impaired Details: Impaired LLE Coordination Gross Motor Movements are Fluid and Coordinated: Yes Fine Motor Movements are Fluid and Coordinated: Yes Coordination and Movement Description: impaired 2/2 incomplete tetraplegia Motor  Motor Motor: Tetraplegia Motor - Skilled Clinical Observations: incomplete tetraplegia Motor - Discharge Observations: incomplete tetraplegia; significant improvements since eval  Mobility Bed Mobility Bed Mobility: Rolling Right;Rolling Left;Supine to Sit;Sit to Supine Rolling Right: Contact Guard/Touching assist Rolling Left: Contact Guard/Touching assist Supine to Sit: Contact Guard/Touching assist Sit to Supine: Contact Guard/Touching assist Transfers Transfers: Sit to Stand;Stand Pivot Transfers Sit to Stand: Contact Guard/Touching assist Stand Pivot Transfers: Contact Guard/Touching assist Transfer (Assistive device): Rolling walker Locomotion  Gait Ambulation: Yes Gait Assistance: Contact Guard/Touching assist Gait Distance (Feet): 150 Feet Assistive device: Rolling walker Gait Gait: Yes Gait Pattern: Impaired Gait Pattern: Scissoring;Narrow base of support Gait velocity: decreased High Level Ambulation High Level Ambulation: Side stepping;Backwards walking Side Stepping: min A with RW Backwards Walking: min A with RW Stairs / Additional Locomotion Stairs: Yes Stairs Assistance: Minimal Assistance - Patient > 75% Stair Management Technique: Two rails;Step to pattern Number of Stairs: 4 Height of Stairs: 6 Ramp: Contact Guard/touching assist Curb: Nurse, mental health Mobility: Yes Wheelchair Assistance: Theme park manager: Both upper extremities;Both lower extermities Wheelchair Parts Management: Needs assistance Distance: 150  Trunk/Postural Assessment  Cervical Assessment Cervical Assessment: Exceptions to WFL(cervical precautions; forward head) Thoracic Assessment Thoracic Assessment: Exceptions to WFL(rounded shoulders) Lumbar Assessment Lumbar Assessment: Exceptions to WFL(posterior pelvic tilt) Postural Control Postural Control: Deficits on evaluation Trunk Control: CGA in standing  Balance Balance Balance Assessed: Yes Static Sitting Balance Static Sitting - Balance Support: Feet supported;No upper extremity supported Static Sitting - Level of  Assistance: 5: Stand by assistance Dynamic Sitting Balance Dynamic Sitting - Balance Support: No upper extremity supported;Feet supported;During functional activity Dynamic Sitting - Level of Assistance: 5: Stand by assistance Static Standing Balance Static Standing - Balance Support: Bilateral upper extremity supported;During functional activity Static Standing - Level of Assistance: 5: Stand by assistance Dynamic Standing Balance Dynamic Standing - Balance Support: Bilateral upper extremity supported;During functional activity Dynamic Standing - Level of Assistance: 4: Min assist Extremity Assessment   RLE Assessment RLE Assessment: Within Functional Limits Passive Range of Motion (PROM) Comments: tight HS General Strength Comments: see below RLE Strength Right Hip Flexion: 4/5 Right Knee Flexion: 4/5 Right Knee Extension: 4/5 Right Ankle Dorsiflexion: 4/5 LLE Assessment LLE Assessment: Exceptions to [facility] Passive Range of Motion (PROM) Comments: tight HS General Strength Comments: impaired, see below LLE Strength Left Hip Flexion: 3/5 Left Knee Flexion: 3/5 Left Knee Extension: 4/5 Left Ankle Dorsiflexion: 3/5     Brian Daniels, PT, DPT 08/25/2019, 4:08 PM

## 2019-08-25 NOTE — Progress Notes (Signed)
Physical Therapy Session Note  Patient Details  Name: Brian Daniels MRN: [id number] Date of Birth: Aug 09, 1955  Today's Date: 08/25/2019 PT Individual Time: 1000-1100; 1300-1415 PT Individual Time Calculation (min): 60 min and 75 min   Short Term Goals: Week 2:  PT Short Term Goal 1 (Week 2): Pt will complete transfers with min A consistently PT Short Term Goal 2 (Week 2): Pt will ambulate x 75 ft with LRAD and min A PT Short Term Goal 3 (Week 2): Pt will propel manual w/c x 150 ft at Supervision level  Skilled Therapeutic Interventions/Progress Updates:    Session 1: Pt received seated in bed, agreeable to PT session. No complaints of pain this AM. Pt's sister [name] present for hands-on family education session. Bed mobility with CGA with cues for log-roll technique. Pt's sister is able to assist him with donning hard collar while seated EOB. Sit to stand with CGA to RW. Stand pivot transfer bed to w/c with RW and CGA. Ascend/descend one curb step with RW and min A with cues for safe sequencing and technique. Car transfer with CGA from low car seat height to simulate pt's sister's car. Ambulation x 150 ft with RW and CGA for balance. Provided demonstration of all transfers, gait, and stair management with RW and pt's sister performs return demo. She demonstrates good understanding of and ability to provide CGA overall for pt's mobility. Both patient and his sister feel comfortable with current assist level required. Pt left seated in w/c in room with needs in reach at end of session.  Session 2: Pt received seated in w/c in room, agreeable to PT session. No complaints of pain. Sit to stand with CGA to RW throughout session. Ascend/descend ramp with RW at [facility] level with v/c for safe RW management. Ambulation across uneven ground with RW and min A, max cueing for safety and RW management. Ascend/descend 4 x 6" stairs with 2 handrails and min A for balance, step-to gait pattern. Forwards/backwards  ambulation 2 x 30 ft with RW and min A for balance, narrowing of BOS while ambulating backwards with v/c to widen BOS. Sidesteps L/R 2 x 30 ft with RW and min A for balance. Pt left seated in w/c in room with needs in reach at end of session.  Therapy Documentation Precautions:  Precautions Precautions: Fall Precaution Comments: pt legally blind in both eyes Required Braces or Orthoses: Cervical Brace Cervical Brace: Hard collar Restrictions Weight Bearing Restrictions: No    Therapy/Group: Individual Therapy  Peter Congo, PT, DPT  08/25/2019, 4:03 PM

## 2019-08-25 NOTE — Discharge Instructions (Signed)
Inpatient Rehab Discharge Instructions  Scotty Weigelt Discharge date and time: No discharge date for patient encounter.   Activities/Precautions/ Functional Status: Activity: Cervical collar as directed Diet: regular diet Wound Care: keep wound clean and dry Functional status:  ___ No restrictions     ___ Walk up steps independently ___ 24/7 supervision/assistance   ___ Walk up steps with assistance ___ Intermittent supervision/assistance  ___ Bathe/dress independently ___ Walk with walker     _x__ Bathe/dress with assistance ___ Walk Independently    ___ Shower independently ___ Walk with assistance    ___ Shower with assistance ___ No alcohol     ___ Return to work/school ________    lovenox 40 mg daily until 10/08/2019 and stop  COMMUNITY REFERRALS UPON DISCHARGE:    Home Health:   PT     OT                        Agency:  [facility] Phone: [phone]  *Please expect follow-up 2-3 days from discharge to schedule your home visit. If you have not received follow-up, be sure to contact the agency directly.*    My questions have been answered and I understand these instructions. I will adhere to these goals and the provided educational materials after my discharge from the hospital.  Patient/Caregiver Signature _______________________________ Date __________  Clinician Signature _______________________________________ Date __________  Please bring this form and your medication list with you to all your follow-up doctor's appointments.

## 2019-08-26 LAB — GLUCOSE, CAPILLARY: Glucose-Capillary: 84 mg/dL (ref 70–99)

## 2019-08-26 NOTE — Progress Notes (Signed)
Social Work Discharge Note   The overall goal for the admission was met for:   Discharge location: Yes. Pt discharged to home with his sister Brian Daniels who will be primary caregiver.  Length of Stay: Yes. 17 days  Discharge activity level: Yes. CGA to Min Asst (pt is legally blind)  Home/community participation: Yes  Services provided included: MD, RD, PT, OT, RN, CM, TR, Pharmacy, Neuropsych and SW  Financial Services: Other: BCBS Medicare  Follow-up services arranged: HHPT/OT with [facility] [phone])  Comments (or additional information): Pt provided resource for [employer] transportation application, and Department of Social Services- services for the blind for adaptive equipment once he discharges home.  Patient/Family verbalized understanding of follow-up arrangements: Yes  Individual responsible for coordination of the follow-up plan: Pt sister Brian Daniels [phone]) to assist pt with discharge needs.   Confirmed correct DME delivered: Brian Daniels 08/26/2019    Loralee Pacas, MSW, [place] Office: [phone] Cell: [phone] Fax: [phone]

## 2019-08-26 NOTE — Progress Notes (Signed)
Family education was completed with sister. Sister demonstrated how to properly give Lovenox injection and all questions were answered. Reuben Likes, LPN

## 2019-08-26 NOTE — Progress Notes (Signed)
Pt was D/Cd from the IR unit with all personal belongings and all questions answered. Pt left the facility with sister. Pt states he will attend all follow up appts. Reuben Likes, LPN

## 2019-08-26 NOTE — Progress Notes (Signed)
[facility] PHYSICAL MEDICINE & REHABILITATION PROGRESS NOTE   Subjective/Complaints:  Pt reports he's ready for d/c today.   Incision looks great.   ROS: patient denies SOB, CP, abd pain, vision changes (is blind), sleep issues.  N/V/D/Constipation.   Objective:   No results found. No results for input(s): WBC, HGB, HCT, PLT in the last 72 hours. No results for input(s): NA, K, CL, CO2, GLUCOSE, BUN, CREATININE, CALCIUM in the last 72 hours.  Intake/Output Summary (Last 24 hours) at 08/26/2019 0923 Last data filed at 08/26/2019 0914 Gross per 24 hour  Intake 354 ml  Output 1000 ml  Net -646 ml     Physical Exam: Vital Signs Blood pressure 135/75, pulse 66, temperature 98.5 F (36.9 C), resp. rate 17, height 5\' 10"  (1.778 m), weight 76.1 kg, SpO2 98 %.  Constitutional: awake, alert, appropriate; laying in bed; getting fed by CNA ; NAD HENT:  Head: Normocephalic and atraumatic.  Eyes:not wearing sunglasses- L eye brown iris; R eye lightly blue/white iris now due to glaucoma Neck:  ACDF incision site clean and dry with dry dressing- incision looks great- doesn't need dressing; not wearing collar because in bed  Cardiovascular: RRR, no M/R/G Respiratory: CTA B/L no W/R/R;   GI; soft, NT, ND, (+)BS-  Musculoskeletal:   Neck muscles tight- esp scalenes, L upper traps, L levators, and L pecs and deltoid- all tight- TTP Comments: RUE- bicep 4/5, WE 4/5, triceps 4+/5, grip 4-/5, finger 4+/5 LUE- Biceps 3/5, WE 2+/5, triceps 3/5, grip 3/5, finger 3/5. No tenderness over left shoulder joint. RLE- HF 4-/5, KE 4/5, DF 4+/5, PF 4-/5 LLE- HF 2/5, KE 2/5, DF 2/5, PF 2-/5 Neurological:  Hoffman's LUE- strong; (-) Hoffman's in RUE;  no clonus in LEs B/L; less sensory decrease C5-T1 B/L Sensation intact at C3 B/L; otherwise decreased at every dermatome to L3 B/L ; L4-S2 dermatomes B/L very decreased sensation- same S3-S5 B/L Psychiatric:  Very appropriate and pleasant     Assessment/Plan: 1. Functional deficits secondary to C3 ASIA C/D quadriplegia  which require 3+ hours per day of interdisciplinary therapy in a comprehensive inpatient rehab setting.  Physiatrist is providing close team supervision and 24 hour management of active medical problems listed below.  Physiatrist and rehab team continue to assess barriers to discharge/monitor patient progress toward functional and medical goals  Care Tool:  Bathing    Body parts bathed by patient: Right arm, Left arm, Chest, Abdomen, Front perineal area, Right upper leg, Left upper leg, Right lower leg, Face, Buttocks   Body parts bathed by helper: Left lower leg     Bathing assist Assist Level: Minimal Assistance - Patient > 75%     Upper Body Dressing/Undressing Upper body dressing   What is the patient wearing?: Pull over shirt    Upper body assist Assist Level: Minimal Assistance - Patient > 75%    Lower Body Dressing/Undressing Lower body dressing      What is the patient wearing?: Incontinence brief, Pants     Lower body assist Assist for lower body dressing: Moderate Assistance - Patient 50 - 74%     Toileting Toileting    Toileting assist Assist for toileting: Moderate Assistance - Patient 50 - 74%     Transfers Chair/bed transfer  Transfers assist     Chair/bed transfer assist level: Contact Guard/Touching assist     Locomotion Ambulation   Ambulation assist   Ambulation activity did not occur: Safety/medical concerns  Assist level: Contact Guard/Touching  assist Assistive device: Walker-rolling Max distance: 150'   Walk 10 feet activity   Assist  Walk 10 feet activity did not occur: Safety/medical concerns  Assist level: Contact Guard/Touching assist Assistive device: Walker-rolling   Walk 50 feet activity   Assist Walk 50 feet with 2 turns activity did not occur: Safety/medical concerns  Assist level: Contact Guard/Touching assist Assistive  device: Walker-rolling    Walk 150 feet activity   Assist Walk 150 feet activity did not occur: Safety/medical concerns  Assist level: Contact Guard/Touching assist Assistive device: Walker-rolling    Walk 10 feet on uneven surface  activity   Assist Walk 10 feet on uneven surfaces activity did not occur: Safety/medical concerns   Assist level: Minimal Assistance - Patient > 75% Assistive device: Photographer Will patient use wheelchair at discharge?: Yes Type of Wheelchair: Manual Wheelchair activity did not occur: Safety/medical concerns  Wheelchair assist level: Supervision/Verbal cueing Max wheelchair distance: 150'    Wheelchair 50 feet with 2 turns activity    Assist    Wheelchair 50 feet with 2 turns activity did not occur: Safety/medical concerns   Assist Level: Supervision/Verbal cueing   Wheelchair 150 feet activity     Assist  Wheelchair 150 feet activity did not occur: Safety/medical concerns   Assist Level: Supervision/Verbal cueing   Blood pressure 135/75, pulse 66, temperature 98.5 F (36.9 C), resp. rate 17, height 5\' 10"  (1.778 m), weight 76.1 kg, SpO2 98 %.    Medical Problem List and Plan:  1. Bilateral upper and lower extremity weakness with paresthesias left greater than right with gait disturbance secondary to cervical myelopathy.S/P multilevel discectomy C3-4, 4-5, 5-6 and C6-7 with decompression of spinal cord and exiting nerve roots with arthrodesis 08/05/2019 per Dr. 08/07/2019. HARD Cervical collar when out of bed.  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  -patient may Shower if covers neck incision   Continue CIR PT, OT -ELOS/Goals: Supervision to Min assist - 3-3.5 weeks due to SCI   2/1- will remove honeycomb dressing and place daily dry dressing  2/5- looking good, incision wise  2/6- making sensory and moror gains basically daily.   2/7- goals increased 2/6 due  to great progress 2. Antithrombotics:  -DVT/anticoagulation: SCDs. Discuss Lovenox for DVT prophylaxis with neurosurgery. Check vascular study   1/27- will start lovenox due to increased risk of DVT -antiplatelet therapy: Aspirin 81 mg to begin 08/12/2019  3. Pain Management: Post op pain, nerve pain and spasticity- Hydrocodone/Robaxin as needed  -will try and change to Baclofen for muscle spasms/spasticity once gets to CIR and possibly add Duloxetine for nerve pain   1/27- added baclofen 5 mg TID for spasticity and Duloxetine 30 mg QHS for nerve pain  1/28- add Lidoderm patches to L neck during day. 12 hrs on;12 hrs off  1/29- will increase Cymbalta on Monday. Lidoderm effective for L shoulder pain.   1/30: pain is well controlled  1/31: Complains of some pain over left shoulder. Lidocaine patch on. Not tender to palpation. Likely secondary to scapular destablization given left upper extremity hemiparesis. Ordered diclofenac gel 4 times daily prn for pain.   2/1- will increase Cymbalta to 60 mg daily.   2/8: pain well controlled  2/11- willl try Robaxin 500 mg QID prn for L shoulder tightness 4. Mood: Provide emotional support  -antipsychotic agents: N/A  5. Neuropsych: This patient is capable of making decisions on his own behalf.  6. Skin/Wound Care: Routine skin checks  7. Fluids/Electrolytes/Nutrition: Routine in and outs with follow-up chemistries  8. Hypertension. Toprol-XL 25 mg daily, Lotensin 10 mg daily, Norvasc 5 mg daily. Monitor with increased mobility   1/30: Uncontrolled on last several reads. Will increase Norvasc to 10mg  daily.   2/8: Reviewed flowsheet. Elevated this morning and last night but low yesterday afternoon so will not increase medication dose. Continue to monitor.  9. Diabetes mellitus. Hemoglobin A1c 6.3. Glucophage 500 mg daily. Check blood sugars before meals and at bedtime   CBG (last 3)  Recent Labs    08/25/19 1640 08/25/19 2105 08/26/19 0610  GLUCAP  124* 129* 84    2/2-- BGs well controlled - 99-129  2/3- BGs 70-80- with 1 of 225- won't increase regimen anymore.  2/7- BGs 91-115- con't regimen.   2/8: well controlled. Continue to monitor.   10. Glaucoma/legally blind. Continue eyedrops as directed- continue pt's dark sunglasses for light sensitivity.  11. Hyperlipidemia. Lipitor  12. Constipation in setting of Neurogenic bowel. Adjust bowel program as needed.  1/28- LBM early AM 1/27  2/2- LBM Saturday- 4 days- will give Mg citrate and follow with suppository if needed.   2/3- had good sized BM yesterday without mg citrate.   2/4- LBM this AM  2/6- stop Colace BID; con't Senokot for now and monitor for loose stools  2/11- needs to have BM- will speak with nursing if needs prns 13. Neurogenic bladder- will check PVRS to make sure is emptying since needed a in/out cath due to lack of voiding 2 days ago.  1/27- ordered PVRs  1/29- added Flomax 0.4mg  qsupper since doesn't feel like emptying.   2/2- dribbling- will monitor  2/6- no complaints currently  14/ Hypokalemia  1/27- K+ 3.4- will replete  1/28- labs in AM  1/29- K+ 3.8  2/1- check labs in AM  2/2- K+ 3.7 15. Anemia: 2/8 Hgb is 11.4, stable from 6 days ago.  16. Cough-  2/9- added Robitussin-DM q4 hours prn 17/ Dispo  2/11- d/c tomorrow after lunch  2/12- d/c today- doesn't need dressing on incision of neck since looks so well healed.   LOS: 17 days A FACE TO FACE EVALUATION WAS PERFORMED  Tyshawn Keel 08/26/2019, 9:23 AM

## 2019-08-29 ENCOUNTER — Telehealth: Payer: Self-pay

## 2019-08-29 MED ORDER — LIDOCAINE 5 % EX PTCH: 2.0000 | MEDICATED_PATCH | CUTANEOUS | 0 refills | Status: AC

## 2019-08-29 NOTE — Telephone Encounter (Signed)
Darrell with [employer] called with prior auth for lidocaine patch - he needs you to answer 1 of 4 questions call him back at [phone] option [phone]

## 2019-08-29 NOTE — Telephone Encounter (Signed)
Prior authorization questions answered.  Approved

## 2019-08-31 ENCOUNTER — Telehealth: Payer: Self-pay | Admitting: *Deleted

## 2019-08-31 NOTE — Telephone Encounter (Signed)
Contacted patients designated contact for patient care coordination.  Transitional care call completed Appointment confirmed: once again, patient is confused why they have to see the rehab doctor when home health is providing the service at home.   Address confirmed, new patient packet sent, patient notified to look for it in the mail.  Transitional Care Questions    1. Are you/is patient experiencing any problems since coming home? No Are there any questions regarding any aspect of care?  Patient confused why they have to see Dr. Berline Chough when home health is providing therapies in the home.  I did my best to explain that Dr. Berline Chough is the captain of the team and she delegates to home health.  2. Are there any questions regarding medications administration/dosing?  No  Are meds being taken as prescribed? Yes Patient should review meds with caller to confirm   3. Have there been any falls? No  4. Has Home Health been to the house and/or have they contacted you? Scheduled If not, have you tried to contact them? Can we help you contact them?   5. Are bowels and bladder emptying properly? yes Are there any unexpected incontinence issues? If applicable, is patient following bowel/bladder programs?   6. Any fevers, problems with breathing, unexpected pain? No  7. Are there any skin problems or new areas of breakdown? Itchy skin, otherwise no problems  8. Has the patient/family member arranged specialty MD follow up (ie cardiology/neurology/renal/surgical/etc)? Yes Can we help arrange?   9. Does the patient need any other services or support that we can help arrange? No   10. Are caregivers following through as expected in assisting the patient?  Yes  11. Has the patient quit smoking, drinking alcohol, or using drugs as recommended? No

## 2019-09-01 ENCOUNTER — Telehealth: Payer: Self-pay | Admitting: Physical Medicine and Rehabilitation

## 2019-09-01 ENCOUNTER — Telehealth: Payer: Self-pay

## 2019-09-01 NOTE — Telephone Encounter (Signed)
SW received updates from Knox Burnette/[facility] [phone]) reporting that the initial referral for HHPT/OT sent to [facility] was later sent to them as [facility] unable to accept referral. Reports pt start of care will be tomorrow 2/19. SW left message for pt sister [name] [phone]) to inform on above and provided branch number to [facility]. SW encouraged follow up if needed.

## 2019-09-01 NOTE — Telephone Encounter (Signed)
SW received updates from Britney Burnette/[facility] ([phone]) reporting that the initial referral for HHPT/OT sent to [facility] was later sent to them as [facility] unable to accept referral. Reports pt start of care will be tomorrow 2/19. SW left message for pt sister [name] ([phone]) to inform on above and provided branch number to [facility]. SW encouraged follow up if needed.  

## 2019-09-09 ENCOUNTER — Encounter: Payer: Self-pay | Admitting: Physical Medicine and Rehabilitation

## 2019-09-09 ENCOUNTER — Ambulatory Visit: Payer: Medicare Other | Attending: Internal Medicine

## 2019-09-09 ENCOUNTER — Encounter
Payer: Medicare Other | Attending: Physical Medicine and Rehabilitation | Admitting: Physical Medicine and Rehabilitation

## 2019-09-09 ENCOUNTER — Other Ambulatory Visit: Payer: Self-pay

## 2019-09-09 VITALS — BP 118/77 | HR 60 | Temp 97.5°F | Ht 70.0 in | Wt 158.0 lb

## 2019-09-09 DIAGNOSIS — N319 Neuromuscular dysfunction of bladder, unspecified: Secondary | ICD-10-CM | POA: Diagnosis present

## 2019-09-09 DIAGNOSIS — R252 Cramp and spasm: Secondary | ICD-10-CM | POA: Diagnosis present

## 2019-09-09 DIAGNOSIS — R269 Unspecified abnormalities of gait and mobility: Secondary | ICD-10-CM

## 2019-09-09 DIAGNOSIS — H540X55 Blindness right eye category 5, blindness left eye category 5: Secondary | ICD-10-CM

## 2019-09-09 DIAGNOSIS — G959 Disease of spinal cord, unspecified: Secondary | ICD-10-CM | POA: Diagnosis present

## 2019-09-09 DIAGNOSIS — K592 Neurogenic bowel, not elsewhere classified: Secondary | ICD-10-CM | POA: Insufficient documentation

## 2019-09-09 DIAGNOSIS — M7918 Myalgia, other site: Secondary | ICD-10-CM

## 2019-09-09 DIAGNOSIS — G8250 Quadriplegia, unspecified: Secondary | ICD-10-CM

## 2019-09-09 DIAGNOSIS — R600 Localized edema: Secondary | ICD-10-CM

## 2019-09-09 DIAGNOSIS — Z23 Encounter for immunization: Secondary | ICD-10-CM

## 2019-09-09 MED ORDER — ENOXAPARIN SODIUM 40 MG/0.4ML ~~LOC~~ SOLN: 40.0000 mg | SUBCUTANEOUS | 0 refills | Status: DC

## 2019-09-09 NOTE — Progress Notes (Signed)
Subjective:    Patient ID: Brian Daniels, male    DOB: 1956/02/20, 64 y.o.   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    Still having L neck and L shoulder pain- still 4-5/10- still tense- using voltaren gel, not lidocaine patches at home.   Strength is "pretty good"- strength is getting better- can now touch head which couldn't do before- still problems combing hair due to pain.  Tightness on R side now as well.  Takes Ibuprofen every so often- has some Norco- - wants pain ot be higher before giving Norco-   Bladder- takes the Flomax- still goes a lot at night, but does pretty well during day.  Needs to go again >30 minutes later, but not 15 minutes later.  Uses a urinal.   Bowels still not- working great- LBM 2 days ago- going every 2-3 days-  Taking 1 senokot/day.    Takes Baclofen 3x/day-  Hasn't really been taking Robaxin.   Pain Inventory Average Pain 4 Pain Right Now 5 My pain is intermittent  In the last 24 hours, has pain interfered with the following? General activity 4 Relation with others 4 Enjoyment of life 4 What TIME of day is your pain at its worst? morning Sleep (in general) Fair  Pain is worse with: some activites Pain improves with: rest, heat/ice and medication Relief from Meds: 9  Mobility use a walker ability to climb steps?  no do you drive?  no  Function disabled: date disabled 2003 I need assistance with the following:  feeding, dressing, bathing, toileting, meal prep, household duties and shopping  Neuro/Psych bladder control problems numbness trouble walking  Prior Studies Any changes since last visit?  no  Physicians involved in your care Any changes since last visit?  no   Family History  Problem  Relation Age of Onset  . Hyperlipidemia Mother   . Diabetes Mother   . Hypertension Father    Social History   Socioeconomic History  . Marital status: Single    Spouse name: Not on file  . Number of children: 0  . Years of education: 24  . Highest education level: High school graduate  Occupational History  . Occupation: Retired  Tobacco Use  . Smoking status: Never Smoker  . Smokeless tobacco: Never Used  Substance and Sexual Activity  . Alcohol use: Not Currently  . Drug use: Never  . Sexual activity: Not on file  Other Topics Concern  . Not on file  Social History Narrative   Lives with his sister.   Left-handed.   No daily caffeine use.  Occasional tea.   Social Determinants of Health   Financial Resource Strain:   . Difficulty of Paying Living Expenses: Not on file  Food Insecurity:   . Worried About Programme researcher, broadcasting/film/video in the Last Year: Not on file  . Ran Out of Food in the Last Year: Not on file  Transportation Needs:   . Lack of Transportation (Medical): Not on file  . Lack of Transportation (Non-Medical): Not on file  Physical Activity:   . Days of Exercise per Week: Not on file  . Minutes of Exercise per Session: Not on file  Stress:   . Feeling of Stress : Not  on file  Social Connections:   . Frequency of Communication with Friends and Family: Not on file  . Frequency of Social Gatherings with Friends and Family: Not on file  . Attends Religious Services: Not on file  . Active Member of Clubs or Organizations: Not on file  . Attends Archivist Meetings: Not on file  . Marital Status: Not on file   Past Surgical History:  Procedure Laterality Date  . ANTERIOR CERVICAL DECOMPRESSION/DISCECTOMY FUSION 4 LEVELS N/A 08/05/2019   Procedure: ANTERIOR CERVICAL DECOMPRESSION/DISCECTOMY FUSION CERVICAL THREE- CERVICAL FOUR, CERVICAL FOUR- CERVICAL FIVE, CERVICAL FIVE- CERVICAL SIX, CERVICAL SIX- CERVICAL SEVEN;  Surgeon: Consuella Lose, MD;   Location: [facility];  Service: Neurosurgery;  Laterality: N/A;  anterior  . COLONOSCOPY    . EYE SURGERY Bilateral    multiple surgeries bilateral -   . MULTIPLE TOOTH EXTRACTIONS    . WISDOM TOOTH EXTRACTION     Past Medical History:  Diagnosis Date  . Cataracts, bilateral    removed by surgery  . Diabetes mellitus without complication ([place])    type 2  . Glaucoma    bilateral  . Gout   . Hypercholesteremia   . Hypertension   . Left atrial enlargement   . Legally blind    bilateral  . Neuropathy    hands, arm  . Numbness    hands, arms  . Wears partial dentures    lower  . Wheelchair bound    since 05/30/19   There were no vitals taken for this visit.  Opioid Risk Score:   Fall Risk Score:  `1  Depression screen PHQ 2/9  No flowsheet data found.   Review of Systems  Constitutional: Negative.   HENT: Negative.   Eyes: Negative.   Respiratory: Negative.   Cardiovascular: Negative.   Gastrointestinal: Negative.   Endocrine: Negative.   Genitourinary: Positive for difficulty urinating.  Musculoskeletal: Positive for arthralgias and gait problem.  Skin: Negative.   Allergic/Immunologic: Negative.   Neurological: Positive for numbness.  Hematological: Negative.   Psychiatric/Behavioral: Negative.   All other systems reviewed and are negative.      Objective:   Physical Exam  Awake, alert, appropriate, accompanied by sister, [name], using RW< NAD L pec as well as L upper traps, and levator and scalenes extremely tight- looser on R, but still tight in same muscles Hoffman's on L, not R LLE 3 beats clonus; MAS of 1+ in LLE and 1 in RLE None in RUE; MAS of 1 in LUE      Assessment & Plan:   Patient is a C3 ASIA C/D incomplete quadriplegia with  s/p multilevel discectomy C3-4, 4-5, 5-6 and C6-7 with decompression of spinal cord and exiting nerve roots with arthrodesis 08/05/2019 as well as DM and completely blind B/L- legally blind. Also has muscle tightness  and spasticity.   1. Suggest heat to relax the muscles. Can apply heat, but rub off cream/gel before apply heat.   2. For LE edema- Lay down in bed for 30-60 minutes before bedtime- if possible and then usually void prior to bedtime- reduces amount of get ups at night.   3. Neurogenic bowel- slow gut-  Suggest Senokot 2 tabs/day- Can take up to 2 tabs 2x/day.   4. Give Norco/Hydrocodone when pain 5-6/10-   5. Continue Baclofen 3x/day for muscle tightness/spasticity due to SCI- is tightness like going through molasses. .  6. Can take  Robaxin/Methocarbemol up to 4x/day as needed for muscle tightness  in neck/shoulder.   7. Increased risk of DVT in SCI patient-s needs for 8 weeks total -Continue Lovenox/Enoxaparin- con't until March 27th-   8. Has neurogenic bladder- con't Tamsulosin- only medicine to help SCI patients empty their bladder.   9. Can take Aspirin 81 mg/baby aspirin on Lovenox.   10. Schedule an appointment to trigger point injections for muscle tightness.   Spent a total of 50 minutes on appointment- more than 30 minutes on education as detailed above.

## 2019-09-09 NOTE — Progress Notes (Signed)
   Covid-19 Vaccination Clinic  Name:  Brian Daniels    MRN: [id number] DOB: 04-22-1956  09/09/2019  Mr. Brian Daniels was observed post Covid-19 immunization for 15 minutes without incidence. He was provided with Vaccine Information Sheet and instruction to access the V-Safe system.   Mr. Brian Daniels was instructed to call 911 with any severe reactions post vaccine: [name] Kitchen Difficulty breathing  . Swelling of your face and throat  . A fast heartbeat  . A bad rash all over your body  . Dizziness and weakness    Immunizations Administered    Name Date Dose VIS Date Route   Pfizer COVID-19 Vaccine 09/09/2019  2:21 PM 0.3 mL 06/24/2019 Intramuscular   Manufacturer: [employer], [employer]   Lot: [id number]   NDC: [address]-4

## 2019-09-09 NOTE — Patient Instructions (Signed)
Patient is a C3 ASIA C/D incomplete quadriplegia with  s/p multilevel discectomy C3-4, 4-5, 5-6 and C6-7 with decompression of spinal cord and exiting nerve roots with arthrodesis 08/05/2019 as well as DM and completely blind B/L- legally blind. Also has muscle tightness and spasticity.   1. Suggest heat to relax the muscles. Can apply heat, but rub off cream/gel before apply heat.   2. For LE edema- Lay down in bed for 30-60 minutes before bedtime- if possible and then usually void prior to bedtime- reduces amount of get ups at night.   3. Neurogenic bowel- slow gut-  Suggest Senokot 2 tabs/day- Can take up to 2 tabs 2x/day.   4. Give Norco/Hydrocodone when pain 5-6/10-   5. Continue Baclofen 3x/day for muscle tightness/spasticity due to SCI- is tightness like going through molasses. .  6. Can take  Robaxin/Methocarbemol up to 4x/day as needed for muscle tightness in neck/shoulder.   7. Increased risk of DVT in SCI patient-s needs for 8 weeks total -Continue Lovenox/Enoxaparin- con't until March 27th-   8. Has neurogenic bladder- con't Tamsulosin- only medicine to help SCI patients empty their bladder.   9. Can take Aspirin 81 mg/baby aspirin on Lovenox.   10. Schedule an appointment to trigger point injections for muscle tightness.   11. F/U in 2 months

## 2019-09-19 ENCOUNTER — Telehealth: Payer: Self-pay

## 2019-09-19 MED ORDER — METHOCARBAMOL 500 MG PO TABS: 500.0000 mg | ORAL_TABLET | Freq: Four times a day (QID) | ORAL | 5 refills | Status: DC | PRN

## 2019-09-19 NOTE — Telephone Encounter (Signed)
Refill request for Baclofen, Methocarbomal, Duloxetine. Refills at the pharmacy on file for Baclofen and Duloxetine already. Sent refill with 5 additional refills for Methocarbomal #100 per Dr. Berline Chough.

## 2019-09-23 ENCOUNTER — Encounter
Payer: Medicare Other | Attending: Physical Medicine and Rehabilitation | Admitting: Physical Medicine and Rehabilitation

## 2019-09-23 ENCOUNTER — Other Ambulatory Visit: Payer: Self-pay

## 2019-09-23 ENCOUNTER — Encounter: Payer: Self-pay | Admitting: Physical Medicine and Rehabilitation

## 2019-09-23 VITALS — BP 148/89 | HR 74 | Ht 70.0 in | Wt 158.0 lb

## 2019-09-23 DIAGNOSIS — G959 Disease of spinal cord, unspecified: Secondary | ICD-10-CM

## 2019-09-23 DIAGNOSIS — R269 Unspecified abnormalities of gait and mobility: Secondary | ICD-10-CM | POA: Insufficient documentation

## 2019-09-23 DIAGNOSIS — R252 Cramp and spasm: Secondary | ICD-10-CM

## 2019-09-23 DIAGNOSIS — R3915 Urgency of urination: Secondary | ICD-10-CM | POA: Diagnosis not present

## 2019-09-23 DIAGNOSIS — M7918 Myalgia, other site: Secondary | ICD-10-CM

## 2019-09-23 DIAGNOSIS — N319 Neuromuscular dysfunction of bladder, unspecified: Secondary | ICD-10-CM | POA: Diagnosis present

## 2019-09-23 DIAGNOSIS — R600 Localized edema: Secondary | ICD-10-CM | POA: Diagnosis present

## 2019-09-23 DIAGNOSIS — K592 Neurogenic bowel, not elsewhere classified: Secondary | ICD-10-CM | POA: Diagnosis present

## 2019-09-23 DIAGNOSIS — G8250 Quadriplegia, unspecified: Secondary | ICD-10-CM

## 2019-09-23 DIAGNOSIS — H540X55 Blindness right eye category 5, blindness left eye category 5: Secondary | ICD-10-CM

## 2019-09-23 MED ORDER — BACLOFEN 5 MG PO TABS: 5.0000 mg | ORAL_TABLET | Freq: Three times a day (TID) | ORAL | 5 refills | Status: DC

## 2019-09-23 NOTE — Patient Instructions (Signed)
1. Posterior scalenes B/L Middle scalenes Splenius Capitus Pectoralis Major B/L Rhomboids L only Infraspinatus Teres Major/minor Thoracic paraspinals Lumbar paraspinals Other injections-  L deltoid and B/L forearms   Patient's level of pain prior was 6-7/10 Current level of pain after injections is ~ 3.5/10  There was no bleeding or complications.  Patient was advised to drink a lot of water on day after injections to flush system Will have increased soreness for 12-48 hours after injections.  Can use Lidocaine patches the day AFTER injections Can use theracane on day of injections in places didn't inject Can use heating pad or cold 4-6 hours AFTER injections   2. Suggest a theracane- no massage- hold pressure 2-4 minutes on each spot- no more than 45 minutes in  A Day.  So doesn't get sore/bruised.  3. Use youtube if needs more ideas on how to use it.   4. Can also use tennis balls- and use for low back, buttocks and back of thighs.  5. Needs refills for Baclofen- refilled with 5 RF's- pt NEEDS due to spasticity- needed new refill "only approved for 1 month".  Pt doesn't respond to muscle relaxants for the specific issues of spasticity,  he need SPASTICITY agents.If needs preauthorization, happy to do so.   6. Won't refill Norco since unlikely to need and doesn't want to do controlled opiate agreement at this time.  7. Pt has to d/c from PT- cannot do OT until out of collar. Will start H/H OT if need be, after [facility] OK's collar.    8. F/U in 6 weeks     7 F/U in 6 weeks

## 2019-09-23 NOTE — Progress Notes (Signed)
Pt reports needs baclofen- and possibly Norco.   Spasticity doing OK on baclofen Pain esp in l shoulder is a big issue still.    Sees [facility]- Doing exercises with PT and HEP daily-     Patient here for trigger point injections for L shoulder/neck pain  Consent done and on chart.  Cleaned areas with alcohol and injected using a 27 gauge 1.5 inch needle  Injected  5cc Using 1% Lidocaine with no EPI  Upper traps B/L Levators B/L x2 Posterior scalenes B/L Middle scalenes Splenius Capitus Pectoralis Major B/L Rhomboids L only Infraspinatus Teres Major/minor Thoracic paraspinals Lumbar paraspinals Other injections-  L deltoid and B/L forearms   Patient's level of pain prior was 6-7/10 Current level of pain after injections is ~ 3.5/10  There was no bleeding or complications.  Patient was advised to drink a lot of water on day after injections to flush system Will have increased soreness for 12-48 hours after injections.  Can use Lidocaine patches the day AFTER injections Can use theracane on day of injections in places didn't inject Can use heating pad or cold 4-6 hours AFTER injections   2. Suggest a theracane- no massage- hold pressure 2-4 minutes on each spot- no more than 45 minutes in  A Day.  So doesn't get sore/bruised.  3. Use youtube if needs more ideas on how to use it.   4. Can also use tennis balls- and use for low back, buttocks and back of thighs.  5. Needs refills for Baclofen- refilled with 5 RF's- pt NEEDS due to spasticity- needed new refill "only approved for 1 month".  Pt doesn't respond to muscle relaxants for the specific issues of spasticity,  he need SPASTICITY agents.If needs preauthorization, happy to do so.   6. Won't refill Norco since unlikely to need and doesn't want to do controlled opiate agreement at this time.   7 F/U in 6 weeks  8. Pt has to d/c from PT- cannot do OT until out of collar. Will start H/H OT if need be, after [facility]  [facility]'s collar.     I spent a total of 45 minutes on appointment- more than 30 minutes discussing options for pain control- and spasticity meds necessity.

## 2019-10-05 ENCOUNTER — Ambulatory Visit: Payer: Medicare Other | Attending: Internal Medicine

## 2019-10-05 DIAGNOSIS — Z23 Encounter for immunization: Secondary | ICD-10-CM

## 2019-10-05 NOTE — Progress Notes (Signed)
   Covid-19 Vaccination Clinic  Name:  Brian Daniels    MRN: [id number] DOB: 09/20/1955  10/05/2019  Mr. [name] was observed post Covid-19 immunization for 15 minutes without incident. He was provided with Vaccine Information Sheet and instruction to access the V-Safe system.   Mr. [name] was instructed to call 911 with any severe reactions post vaccine: [name] Kitchen Difficulty breathing  . Swelling of face and throat  . A fast heartbeat  . A bad rash all over body  . Dizziness and weakness   Immunizations Administered    Name Date Dose VIS Date Route   Pfizer COVID-19 Vaccine 10/05/2019  2:52 PM 0.3 mL 06/24/2019 Intramuscular   Manufacturer: [employer], [employer]   Lot: [id number]   NDC: [address]-9      Covid-19 Vaccination Clinic  Name:  Brian Daniels    MRN: [id number] DOB: 12/02/1955  10/05/2019  Mr. [name] was observed post Covid-19 immunization for 15 minutes without incident. He was provided with Vaccine Information Sheet and instruction to access the V-Safe system.   Mr. [name] was instructed to call 911 with any severe reactions post vaccine: [name] Kitchen Difficulty breathing  . Swelling of face and throat  . A fast heartbeat  . A bad rash all over body  . Dizziness and weakness   Immunizations Administered    Name Date Dose VIS Date Route   Pfizer COVID-19 Vaccine 10/05/2019  2:52 PM 0.3 mL 06/24/2019 Intramuscular   Manufacturer: [employer], [employer]   Lot: [id number]   NDC: [address]-7

## 2019-10-17 ENCOUNTER — Telehealth: Payer: Self-pay

## 2019-10-17 NOTE — Telephone Encounter (Signed)
Baclofen approved for one year.

## 2019-11-04 ENCOUNTER — Encounter: Payer: Medicare Other | Admitting: Physical Medicine and Rehabilitation

## 2019-11-07 ENCOUNTER — Encounter
Payer: Medicare Other | Attending: Physical Medicine and Rehabilitation | Admitting: Physical Medicine and Rehabilitation

## 2019-11-07 ENCOUNTER — Other Ambulatory Visit: Payer: Self-pay

## 2019-11-07 ENCOUNTER — Encounter: Payer: Self-pay | Admitting: Physical Medicine and Rehabilitation

## 2019-11-07 VITALS — BP 136/83 | HR 63 | Temp 97.7°F | Ht 70.0 in | Wt 159.0 lb

## 2019-11-07 DIAGNOSIS — K592 Neurogenic bowel, not elsewhere classified: Secondary | ICD-10-CM | POA: Insufficient documentation

## 2019-11-07 DIAGNOSIS — G959 Disease of spinal cord, unspecified: Secondary | ICD-10-CM | POA: Insufficient documentation

## 2019-11-07 DIAGNOSIS — M7918 Myalgia, other site: Secondary | ICD-10-CM

## 2019-11-07 DIAGNOSIS — G8250 Quadriplegia, unspecified: Secondary | ICD-10-CM | POA: Insufficient documentation

## 2019-11-07 DIAGNOSIS — R269 Unspecified abnormalities of gait and mobility: Secondary | ICD-10-CM | POA: Insufficient documentation

## 2019-11-07 DIAGNOSIS — N319 Neuromuscular dysfunction of bladder, unspecified: Secondary | ICD-10-CM | POA: Insufficient documentation

## 2019-11-07 DIAGNOSIS — R600 Localized edema: Secondary | ICD-10-CM | POA: Insufficient documentation

## 2019-11-07 DIAGNOSIS — H540X55 Blindness right eye category 5, blindness left eye category 5: Secondary | ICD-10-CM | POA: Diagnosis present

## 2019-11-07 DIAGNOSIS — R252 Cramp and spasm: Secondary | ICD-10-CM | POA: Insufficient documentation

## 2019-11-07 NOTE — Progress Notes (Signed)
Subjective:    Patient ID: Brian Daniels, male    DOB: 03/27/1956, 64 y.o.   MRN: [id number]  HPI   C3 ASIA C/D Quadriplegia due to spinal cord compression- nontraumatic.   The trigger point injections worked well.  Can tell a difference even before he left here.   Trigger point injections lasted at least 3 weeks.  Certain positions made it worse.   [facility]- got out of collar.  Finished Lovenox on 3/27- 8 weeks total.   R knee cold at times- always feels colder than other one.  Spasms are better- jumps occ- but taking baclofen TID-  Not making him too sleepy.   When neck started hurting, will lay down- to make it feel bette.r  Has been sleeping more in last few weeks for some reason.   Robaxin for muscle tightness in back-   Pain Inventory Average Pain 1 Pain Right Now 1 My pain is minimal pain  In the last 24 hours, has pain interfered with the following? General activity 0 Relation with others 0 Enjoyment of life 0 What TIME of day is your pain at its worst? minimal pain Sleep (in general) Fair  Pain is worse with: minimal pain Pain improves with: minimal pain Relief from Meds: minimal pain  Mobility walk with assistance use a walker how many minutes can you walk? 5 Do you have any goals in this area?  yes  Function disabled: date disabled . retired  Neuro/Psych trouble walking  Prior Studies Any changes since last visit?  no  Physicians involved in your care Any changes since last visit?  no   Family History  Problem Relation Age of Onset  . Hyperlipidemia Mother   . Diabetes Mother   . Hypertension Father    Social History   Socioeconomic History  . Marital status: Single    Spouse name: Not on file  . Number of children: 0  . Years of education: 80  . Highest education level: High school graduate  Occupational History  . Occupation: Retired  Tobacco Use  . Smoking status: Never Smoker  . Smokeless tobacco: Never Used    Substance and Sexual Activity  . Alcohol use: Not Currently  . Drug use: Never  . Sexual activity: Not on file  Other Topics Concern  . Not on file  Social History Narrative   Lives with his sister.   Left-handed.   No daily caffeine use.  Occasional tea.   Social Determinants of Health   Financial Resource Strain:   . Difficulty of Paying Living Expenses:   Food Insecurity:   . Worried About Programme researcher, broadcasting/film/video in the Last Year:   . Barista in the Last Year:   Transportation Needs:   . Freight forwarder (Medical):   [name] Kitchen Lack of Transportation (Non-Medical):   Physical Activity:   . Days of Exercise per Week:   . Minutes of Exercise per Session:   Stress:   . Feeling of Stress :   Social Connections:   . Frequency of Communication with Friends and Family:   . Frequency of Social Gatherings with Friends and Family:   . Attends Religious Services:   . Active Member of Clubs or Organizations:   . Attends Banker Meetings:   [name] Kitchen Marital Status:    Past Surgical History:  Procedure Laterality Date  . ANTERIOR CERVICAL DECOMPRESSION/DISCECTOMY FUSION 4 LEVELS N/A 08/05/2019   Procedure: ANTERIOR CERVICAL DECOMPRESSION/DISCECTOMY FUSION  CERVICAL THREE- CERVICAL FOUR, CERVICAL FOUR- CERVICAL FIVE, CERVICAL FIVE- CERVICAL SIX, CERVICAL SIX- CERVICAL SEVEN;  Surgeon: Consuella Lose, MD;  Location: [facility];  Service: Neurosurgery;  Laterality: N/A;  anterior  . COLONOSCOPY    . EYE SURGERY Bilateral    multiple surgeries bilateral -   . MULTIPLE TOOTH EXTRACTIONS    . WISDOM TOOTH EXTRACTION     Past Medical History:  Diagnosis Date  . Cataracts, bilateral    removed by surgery  . Diabetes mellitus without complication ([place])    type 2  . Glaucoma    bilateral  . Gout   . Hypercholesteremia   . Hypertension   . Left atrial enlargement   . Legally blind    bilateral  . Neuropathy    hands, arm  . Numbness    hands, arms  . Wears partial  dentures    lower  . Wheelchair bound    since 05/30/19   BP 136/83   Pulse 63   Temp 97.7 F (36.5 C)   Ht 5\' 10"  (1.778 m)   Wt 159 lb (72.1 kg)   SpO2 99%   BMI 22.81 kg/m   Opioid Risk Score:   Fall Risk Score:  `1  Depression screen PHQ 2/9  No flowsheet data found.  Review of Systems  Constitutional: Negative.   HENT: Negative.   Eyes: Negative.   Respiratory: Negative.   Cardiovascular: Negative.   Gastrointestinal: Negative.   Endocrine: Negative.   Genitourinary: Negative.   Musculoskeletal: Positive for gait problem.  Skin: Negative.  Negative for pallor.  Allergic/Immunologic: Negative.   Hematological: Negative.   Psychiatric/Behavioral: Negative.   All other systems reviewed and are negative.      Objective:   Physical Exam Awake, alert, appropriate, blind, wearing sunglasses, accompanied by sister, NAD Has lost muscle/atrophy of C5 and below muscle-s esp posteriorly/back and neck- most notable in pecs B/L and rhomboids B/L       Assessment & Plan:  C3 ASIA C/D Quadriplegia due to spinal cord compression- nontraumatic.  1. Methocarbemol- just take as needed for muscle tightness of neck and upper back.   2. Went over muscle atrophy as normal.   3. Patient here for trigger point injections for myofascial pain  Consent done and on chart.  Cleaned areas with alcohol and injected using a 27 gauge 1.5 inch needle  Injected 4.5 cc Using 1% Lidocaine with no EPI  Upper traps B/L Levators B/L Posterior scalenes B/L Middle scalenes Splenius Capitus B/L Pectoralis Major B/L Rhomboids B/L Infraspinatus Teres Major/minor Thoracic paraspinals Lumbar paraspinals Other injections-    Patient's level of pain prior was 3-4/10 Current level of pain after injections is feels better- can't tell how much- better ROM  There was no bleeding or complications.  Patient was advised to drink a lot of water on day after injections to flush  system Will have increased soreness for 12-48 hours after injections.  Can use Lidocaine patches the day AFTER injections Can use theracane on day of injections in places didn't inject Can use heating pad 4-6 hours AFTER injections  4. Con't baclofen- 5 mg 3x/day.   5. F/U in 6 weeks.   I spent a total of 25 minutes on appointment- as documented above.

## 2019-11-07 NOTE — Patient Instructions (Signed)
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  1. Methocarbemol- just take as needed for muscle tightness of neck and upper back.   2. Went over muscle atrophy as normal.   3. Patient here for trigger point injections for myofascial pain  Consent done and on chart.  Cleaned areas with alcohol and injected using a 27 gauge 1.5 inch needle  Injected 4.5 cc Using 1% Lidocaine with no EPI  Upper traps B/L Levators B/L Posterior scalenes B/L Middle scalenes Splenius Capitus B/L Pectoralis Major B/L Rhomboids B/L Infraspinatus Teres Major/minor Thoracic paraspinals Lumbar paraspinals Other injections-    Patient's level of pain prior was 3-4/10 Current level of pain after injections is feels better- can't tell how much- better ROM  There was no bleeding or complications.  Patient was advised to drink a lot of water on day after injections to flush system Will have increased soreness for 12-48 hours after injections.  Can use Lidocaine patches the day AFTER injections Can use theracane on day of injections in places didn't inject Can use heating pad 4-6 hours AFTER injections  4. Con't baclofen- 5 mg 3x/day.   5. F/U in 6 weeks.

## 2019-12-19 ENCOUNTER — Encounter: Payer: Medicare Other | Admitting: Physical Medicine and Rehabilitation

## 2020-01-09 DIAGNOSIS — I10 Essential (primary) hypertension: Secondary | ICD-10-CM | POA: Insufficient documentation

## 2020-01-20 ENCOUNTER — Telehealth: Payer: Self-pay | Admitting: *Deleted

## 2020-01-20 ENCOUNTER — Other Ambulatory Visit: Payer: Self-pay | Admitting: Physical Medicine and Rehabilitation

## 2020-01-20 MED ORDER — TAMSULOSIN HCL 0.4 MG PO CAPS: 0.4000 mg | ORAL_CAPSULE | Freq: Every day | ORAL | 1 refills | Status: DC

## 2020-01-20 NOTE — Telephone Encounter (Signed)
Mr Nordling called requesting refill on his flomax. Dr Berline Chough is still out of office.  Please advise.

## 2020-01-20 NOTE — Telephone Encounter (Signed)
You may refill °

## 2020-01-20 NOTE — Telephone Encounter (Signed)
Refilled and Mr Postiglione notified.

## 2020-02-01 ENCOUNTER — Ambulatory Visit: Payer: Medicare Other | Admitting: Physical Medicine and Rehabilitation

## 2020-02-03 ENCOUNTER — Encounter: Payer: Self-pay | Admitting: Physical Medicine and Rehabilitation

## 2020-02-03 ENCOUNTER — Other Ambulatory Visit: Payer: Self-pay

## 2020-02-03 ENCOUNTER — Encounter
Payer: Medicare Other | Attending: Physical Medicine and Rehabilitation | Admitting: Physical Medicine and Rehabilitation

## 2020-02-03 VITALS — BP 126/77 | HR 65 | Temp 98.0°F | Ht 70.0 in | Wt 167.0 lb

## 2020-02-03 DIAGNOSIS — R600 Localized edema: Secondary | ICD-10-CM | POA: Diagnosis present

## 2020-02-03 DIAGNOSIS — H540X55 Blindness right eye category 5, blindness left eye category 5: Secondary | ICD-10-CM | POA: Diagnosis not present

## 2020-02-03 DIAGNOSIS — R252 Cramp and spasm: Secondary | ICD-10-CM | POA: Insufficient documentation

## 2020-02-03 DIAGNOSIS — M7918 Myalgia, other site: Secondary | ICD-10-CM | POA: Insufficient documentation

## 2020-02-03 DIAGNOSIS — K592 Neurogenic bowel, not elsewhere classified: Secondary | ICD-10-CM | POA: Diagnosis present

## 2020-02-03 DIAGNOSIS — G8250 Quadriplegia, unspecified: Secondary | ICD-10-CM | POA: Diagnosis not present

## 2020-02-03 DIAGNOSIS — N319 Neuromuscular dysfunction of bladder, unspecified: Secondary | ICD-10-CM | POA: Diagnosis present

## 2020-02-03 DIAGNOSIS — G959 Disease of spinal cord, unspecified: Secondary | ICD-10-CM | POA: Diagnosis not present

## 2020-02-03 DIAGNOSIS — R269 Unspecified abnormalities of gait and mobility: Secondary | ICD-10-CM | POA: Diagnosis present

## 2020-02-03 MED ORDER — BACLOFEN 10 MG PO TABS: 5.0000 mg | ORAL_TABLET | Freq: Three times a day (TID) | ORAL | 3 refills | Status: DC

## 2020-02-03 NOTE — Patient Instructions (Signed)
C3 ASIA D Quadriplegia due to spinal cord compression- nontraumatic.with Complete blindness B/L- muscles are 4-5/5 now- so heading towards an ASIA E   1. Con't Baclofen and Methocarbemol, Duloxetine and Flomax  2. Taking Methocarbemol 1x/day now. Sleeping/resting a lot during the day. Cannot change to Skelaxin- not covered by insurance.  Will maintain current medicine for now.   3. Sent Baclofen 5 mg 3x/day- 1/2 tab of 10 mg 3x/day-  4. Work on staying up 8-12 hours/day.  Not resting all the time. Laying down al the time- can cause increased weakness.   5. Wait on Trigger point injections this visit- will think about doing next visit- use theracane 3+days/week for muscle tightness.  Use when neck bothering him.   6. F/U 2 months- with possible trigger point injections.

## 2020-02-03 NOTE — Progress Notes (Signed)
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 Here for f/u.   Has Rx for Robaxin for muscle tightness- just takes at night.  Per sister, asking for it- helping muscle tightness/spasms in neck.   Jumping of Legs is still doing better- on baclofen  Takes 5 mg 3x/day.   Done with shots- completely.  Hasn't taken Norco in months- hasn't needed it.   Pain so much better- better than it was initially.  Believes it's OK currently- thinks it's pretty well controlled.    Voiding- gets up at night- minimum of 4x at night to get up- drinks a lot of water.   Does all right during day- with voiding- doesn't need to go that often-  Drinks 16 oz water- bottled water 16.9 oz and drinks 4 of them/day-   Bowels- so much better- emptying ok on bowels- 2x/day.  Denies constipation.  Not on any PO bowel meds.   Walking with cane now-  Wants to know if still needs cane.   Sleeping a lot- gets up at 4am- if has mild pain, goes back to bed/sleep/rest.   In room most of day- and he used to play keyboard- pt feels like goes back to bed due to neck pain.    Exam: Awake, alert, appropriate, accompanied by sister, NAD MS: UEs deltoids, biceps, triceps, WE, grip and finger abd 5/5 B/L LEs- HF 4/5, KE 4+/5, KF 4+/5, DF and PF 5-/5 B/L Neuro:decreased sensation on ulnar aspect of hands B/L L>R No hoffman's B/L; 2 beats clonus RLE- not on LLE Mas of 1 in Hips/knees B/L Muscles slightly tight but not bad in upper traps, levators, etc  Plan: C3 ASIA D Quadriplegia due to spinal cord compression- nontraumatic.with Complete blindness B/L- muscles are 4-5/5 now- so heading towards an ASIA E   1. Con't Baclofen and Methocarbemol, Duloxetine and Flomax  2. Taking Methocarbemol 1x/day now. Sleeping/resting a lot during the day. Cannot change to Skelaxin- not covered by insurance.  Will maintain current medicine for now.   3. Sent Baclofen 5 mg 3x/day- 1/2 tab of 10 mg 3x/day-  4.  Work on staying up 8-12 hours/day.  Not resting all the time. Laying down al the time- can cause increased weakness.   5. Wait on Trigger point injections this visit- will think about doing next visit- use theracane 3+days/week for muscle tightness.  Use when neck bothering him.   6. F/U 2 months- with possible trigger point injections.   I spent a total of 40 minutes on visit- as detailed above- spent a lot of time talking about resting.

## 2020-02-12 ENCOUNTER — Other Ambulatory Visit: Payer: Self-pay | Admitting: Physical Medicine and Rehabilitation

## 2020-03-22 ENCOUNTER — Other Ambulatory Visit: Payer: Self-pay | Admitting: Physical Medicine and Rehabilitation

## 2020-04-02 ENCOUNTER — Encounter: Payer: Self-pay | Admitting: Physical Medicine and Rehabilitation

## 2020-04-02 ENCOUNTER — Encounter
Payer: Medicare Other | Attending: Physical Medicine and Rehabilitation | Admitting: Physical Medicine and Rehabilitation

## 2020-04-02 ENCOUNTER — Other Ambulatory Visit: Payer: Self-pay

## 2020-04-02 VITALS — BP 124/81 | HR 61 | Temp 98.1°F | Ht 70.0 in | Wt 170.0 lb

## 2020-04-02 DIAGNOSIS — R252 Cramp and spasm: Secondary | ICD-10-CM

## 2020-04-02 DIAGNOSIS — G959 Disease of spinal cord, unspecified: Secondary | ICD-10-CM | POA: Diagnosis present

## 2020-04-02 DIAGNOSIS — G8250 Quadriplegia, unspecified: Secondary | ICD-10-CM | POA: Diagnosis present

## 2020-04-02 DIAGNOSIS — H540X55 Blindness right eye category 5, blindness left eye category 5: Secondary | ICD-10-CM | POA: Diagnosis present

## 2020-04-02 DIAGNOSIS — R269 Unspecified abnormalities of gait and mobility: Secondary | ICD-10-CM

## 2020-04-02 DIAGNOSIS — R202 Paresthesia of skin: Secondary | ICD-10-CM | POA: Diagnosis not present

## 2020-04-02 DIAGNOSIS — K592 Neurogenic bowel, not elsewhere classified: Secondary | ICD-10-CM | POA: Insufficient documentation

## 2020-04-02 DIAGNOSIS — R600 Localized edema: Secondary | ICD-10-CM | POA: Diagnosis present

## 2020-04-02 DIAGNOSIS — N319 Neuromuscular dysfunction of bladder, unspecified: Secondary | ICD-10-CM | POA: Diagnosis present

## 2020-04-02 DIAGNOSIS — M7918 Myalgia, other site: Secondary | ICD-10-CM | POA: Insufficient documentation

## 2020-04-02 NOTE — Patient Instructions (Signed)
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 Here for f/u. Improved Bowel and bladder.    1. At least take Baclofen  Every night- then can take as needed for leg jumping, up to 3x/day.  Baclofen nightly, no matter what, 5 mg QHS- and up to 2x/day as needed.     2. Needs to work on sitting up 8-12 hours/day.  Going to the park- watch for uneven ground.  Has to use cane outside the home still, for now.     3. Trying to make sure safe, so doesn't fall. So please use cane!  4. Bowel/bladder going well.   5. F/U in 3 months since doing well.   6. Shoulder exercises for stretching.  Stretch for pecs- stand in corner- use arms against wall to stretch chest- do for at least .   7. Tennis ball- lean against the wall with tennis ball on muscles that's tight/hurts and hold - don't massage, hold in place for 2-4 minutes on each spot. Called myofascial release.   8. Most of my patients do these stretches 3-4x/week. Can do every day.

## 2020-04-02 NOTE — Progress Notes (Signed)
Subjective:    Patient ID: Brian Daniels, male    DOB: 24-Dec-1955, 64 y.o.   MRN: [id number]  HPI  C3 ASIA C/D Quadriplegia due to spinal cord compression- nontraumatic. Here for f/u.    Baclofen 5 mg 3x/day- actually not cutting it in half.  Takes 2-3x/week, since legs aren't jumping as much.  Jumps more at night.   Off Methocarbemol now. Doing well off it.  Not as sleepy.  Still a little sleepy.    Is doing well overall- still in bed a lot- in his room most of the time.  Usually lays down. Doesn't hurt when laying down.   Doesn't hurt right now.    Favors 1 side when walks  L side feels a little weaker with walking- in the hip area.   Pain Inventory Average Pain 2 Pain Right Now 2 My pain is dull  LOCATION OF PAIN  Neck shoulder  BOWEL Number of stools per week: 10 Oral laxative use No  Type of laxative none Enema or suppository use No  History of colostomy No  Incontinent No   BLADDER Normal In and out cath, frequency: n/a Able to self cath: N/A Bladder incontinence No  Frequent urination No  Leakage with coughing No  Difficulty starting stream No  Incomplete bladder emptying No    Mobility walk with assistance use a cane do you drive?  no  Function retired  Neuro/Psych weakness trouble walking spasms  Prior Studies Any changes since last visit?  no  Physicians involved in your care Any changes since last visit?  no   Family History  Problem Relation Age of Onset  . Hyperlipidemia Mother   . Diabetes Mother   . Hypertension Father    Social History   Socioeconomic History  . Marital status: Single    Spouse name: Not on file  . Number of children: 0  . Years of education: 40  . Highest education level: High school graduate  Occupational History  . Occupation: Retired  Tobacco Use  . Smoking status: Never Smoker  . Smokeless tobacco: Never Used  Vaping Use  . Vaping Use: Never used  Substance and Sexual Activity  .  Alcohol use: Not Currently  . Drug use: Never  . Sexual activity: Not on file  Other Topics Concern  . Not on file  Social History Narrative   Lives with his sister.   Left-handed.   No daily caffeine use.  Occasional tea.   Social Determinants of Health   Financial Resource Strain:   . Difficulty of Paying Living Expenses: Not on file  Food Insecurity:   . Worried About Programme researcher, broadcasting/film/video in the Last Year: Not on file  . Ran Out of Food in the Last Year: Not on file  Transportation Needs:   . Lack of Transportation (Medical): Not on file  . Lack of Transportation (Non-Medical): Not on file  Physical Activity:   . Days of Exercise per Week: Not on file  . Minutes of Exercise per Session: Not on file  Stress:   . Feeling of Stress : Not on file  Social Connections:   . Frequency of Communication with Friends and Family: Not on file  . Frequency of Social Gatherings with Friends and Family: Not on file  . Attends Religious Services: Not on file  . Active Member of Clubs or Organizations: Not on file  . Attends Banker Meetings: Not on file  . Marital Status: Not  on file   Past Surgical History:  Procedure Laterality Date  . ANTERIOR CERVICAL DECOMPRESSION/DISCECTOMY FUSION 4 LEVELS N/A 08/05/2019   Procedure: ANTERIOR CERVICAL DECOMPRESSION/DISCECTOMY FUSION CERVICAL THREE- CERVICAL FOUR, CERVICAL FOUR- CERVICAL FIVE, CERVICAL FIVE- CERVICAL SIX, CERVICAL SIX- CERVICAL SEVEN;  Surgeon: Lisbeth Renshaw, MD;  Location: [facility];  Service: Neurosurgery;  Laterality: N/A;  anterior  . COLONOSCOPY    . EYE SURGERY Bilateral    multiple surgeries bilateral -   . MULTIPLE TOOTH EXTRACTIONS    . WISDOM TOOTH EXTRACTION     Past Medical History:  Diagnosis Date  . Cataracts, bilateral    removed by surgery  . Diabetes mellitus without complication ([facility])    type 2  . Glaucoma    bilateral  . Gout   . Hypercholesteremia   . Hypertension   . Left atrial  enlargement   . Legally blind    bilateral  . Neuropathy    hands, arm  . Numbness    hands, arms  . Wears partial dentures    lower  . Wheelchair bound    since 05/30/19   BP 124/81   Pulse 61   Temp 98.1 F (36.7 C)   Ht 5\' 10"  (1.778 m)   Wt 170 lb (77.1 kg)   SpO2 98%   BMI 24.39 kg/m   Opioid Risk Score:   Fall Risk Score:  `1  Depression screen PHQ 2/9  Depression screen PHQ 2/9 02/03/2020  Decreased Interest 0  Down, Depressed, Hopeless 0  PHQ - 2 Score 0    Review of Systems  Constitutional: Negative.   HENT: Negative.   Respiratory: Negative.   Cardiovascular: Negative.   Gastrointestinal: Negative.   Endocrine: Negative.   Musculoskeletal: Positive for arthralgias, gait problem and neck pain.       Spasms  Skin: Negative.   Allergic/Immunologic: Negative.   Neurological: Positive for weakness.  Psychiatric/Behavioral: Negative.   All other systems reviewed and are negative.      Objective:   Physical Exam  Awake, alert, appropriate, wearing dark glasses, accompanied by sister.  Using cane MS: Strength is great 5/5 in UEs B/L but L HF 4/5- otherwise 5/5 in LEs B/L  Neuro: Hofmfan's LUE; not on R B/L (-) clonus MAS better- MAS of 1 at most- on exam      Assessment & Plan:   C3 ASIA C/D Quadriplegia due to spinal cord compression- nontraumatic. Here for f/u. Improved Bowel and bladder.    1. At least take Baclofen  Every night- then can take as needed for leg jumping, up to 3x/day.  Baclofen nightly, no matter what, 5 mg QHS- and up to 2x/day as needed.     2. Needs to work on sitting up 8-12 hours/day.  Going to the park- watch for uneven ground.  Has to use cane outside the home still, for now.     3. Trying to make sure safe, so doesn't fall. So please use cane!  4. Bowel/bladder going well.   5. F/U in 3 months since doing well.   6. Shoulder exercises for stretching.  Stretch for pecs- stand in corner- use arms against  wall to stretch chest- do for at least 2 minutes.   7. Tennis ball- lean against the wall with tennis ball on muscles that's tight/hurts and hold - don't massage, hold in place for 2-4 minutes on each spot. Called myofascial release.   8. Most of my patients do these stretches 3-4x/week. Can  do every day.   I spent a total of 35 minutes on visit- as detailed above- showed pt - who is blind how to do stretches and sister as well.

## 2020-07-11 ENCOUNTER — Other Ambulatory Visit: Payer: Self-pay

## 2020-07-11 ENCOUNTER — Encounter: Payer: Self-pay | Admitting: Physical Medicine and Rehabilitation

## 2020-07-11 ENCOUNTER — Encounter
Payer: Medicare Other | Attending: Physical Medicine and Rehabilitation | Admitting: Physical Medicine and Rehabilitation

## 2020-07-11 VITALS — BP 138/84 | HR 57 | Temp 98.1°F | Ht 70.0 in | Wt 171.6 lb

## 2020-07-11 DIAGNOSIS — M7918 Myalgia, other site: Secondary | ICD-10-CM

## 2020-07-11 DIAGNOSIS — G8250 Quadriplegia, unspecified: Secondary | ICD-10-CM

## 2020-07-11 DIAGNOSIS — H540X55 Blindness right eye category 5, blindness left eye category 5: Secondary | ICD-10-CM | POA: Diagnosis present

## 2020-07-11 DIAGNOSIS — G959 Disease of spinal cord, unspecified: Secondary | ICD-10-CM

## 2020-07-11 NOTE — Patient Instructions (Signed)
C3 ASIA C/D Quadriplegia due to spinal cord compression- nontraumatic.Here for f/u. Improved Bowel and bladder. With associated myofascial pain.    1. Tennis balls- use against wall- 2-4 minutes - don't massage- just in hold in place/put pressure on it-  Do this 2-3x/week.   2. Continue stretches with sister at least 3-4x/week.   3. Still using cane, esp when out- furniture walks at home.   4. Continue staying out bed unless sleeping.   5. Doesn't need refills on baclofen today.   6. Continue taking Baclofen 5 mg nightly, but can take 1 tab in morning as well.  Could help muscle tightness.   7. F/U in 6 months- call if needs trigger point injections prior to that.

## 2020-07-11 NOTE — Progress Notes (Signed)
Subjective:    Patient ID: Brian Daniels, male    DOB: 1955-11-27, 64 y.o.   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 Improved Bowel and bladder.   Has worked on sitting up, not in bed - doesn't stay in bed like used to- close to 12 hours/day out of bed-  Taking Baclofen at night- not much during the day.   Does stretching exercises with sister- if she doesn't do them, he is likely to not do them.   Didn't try the tennis ball against wall.  The muscles in back still a little tight.   Has done massage of neck/back for him sometimes- helps the pain some.  Whenever when gets a knot- sister also gets.     Pain Inventory Average Pain 1 Pain Right Now 1 My pain is dull  In the last 24 hours, has pain interfered with the following? General activity 0 Relation with others 0 Enjoyment of life 0 What TIME of day is your pain at its worst? morning  Sleep (in general) Good  Pain is worse with: na Pain improves with: na Relief from Meds: na  use a cane use a walker ability to climb steps?  yes do you drive?  no  retired  weakness trouble walking  Any changes since last visit?  no  Any changes since last visit?  no    Family History  Problem Relation Age of Onset  . Hyperlipidemia Mother   . Diabetes Mother   . Hypertension Father    Social History   Socioeconomic History  . Marital status: Single    Spouse name: Not on file  . Number of children: 0  . Years of education: 75  . Highest education level: High school graduate  Occupational History  . Occupation: Retired  Tobacco Use  . Smoking status: Never Smoker  . Smokeless tobacco: Never Used  Vaping Use  . Vaping Use: Never used  Substance and Sexual Activity  . Alcohol use: Not Currently  . Drug use: Never  . Sexual activity: Not on file  Other Topics Concern  . Not on file  Social History Narrative   Lives with his sister.    Left-handed.   No daily caffeine use.  Occasional tea.   Social Determinants of Health   Financial Resource Strain: Not on file  Food Insecurity: Not on file  Transportation Needs: Not on file  Physical Activity: Not on file  Stress: Not on file  Social Connections: Not on file   Past Surgical History:  Procedure Laterality Date  . ANTERIOR CERVICAL DECOMPRESSION/DISCECTOMY FUSION 4 LEVELS N/A 08/05/2019   Procedure: ANTERIOR CERVICAL DECOMPRESSION/DISCECTOMY FUSION CERVICAL THREE- CERVICAL FOUR, CERVICAL FOUR- CERVICAL FIVE, CERVICAL FIVE- CERVICAL SIX, CERVICAL SIX- CERVICAL SEVEN;  Surgeon: Lisbeth Renshaw, MD;  Location: [facility] OR;  Service: Neurosurgery;  Laterality: N/A;  anterior  . COLONOSCOPY    . EYE SURGERY Bilateral    multiple surgeries bilateral -   . MULTIPLE TOOTH EXTRACTIONS    . WISDOM TOOTH EXTRACTION     Past Medical History:  Diagnosis Date  . Cataracts, bilateral    removed by surgery  . Diabetes mellitus without complication (HCC)    type 2  . Glaucoma    bilateral  . Gout   . Hypercholesteremia   . Hypertension   . Left atrial enlargement   . Legally blind    bilateral  . Neuropathy  hands, arm  . Numbness    hands, arms  . Wears partial dentures    lower  . Wheelchair bound    since 05/30/19   BP 138/84   Pulse (!) 57   Temp 98.1 F (36.7 C)   Ht 5\' 10"  (1.778 m)   Wt 171 lb 9.6 oz (77.8 kg)   SpO2 98%   BMI 24.62 kg/m   Opioid Risk Score:   Fall Risk Score:  `1  Depression screen PHQ 2/9  Depression screen PHQ 2/9 02/03/2020  Decreased Interest 0  Down, Depressed, Hopeless 0  PHQ - 2 Score 0    Review of Systems An entire ROS was completed and found ot be negative except for HPI.     Objective:   Physical Exam  Awake, alert, wearing sunglasses due to blindness, NAD Brought cane Sister here as well  MS:  UEs biceps 4+/5- triceps 5/5, WE 5-/5, grip 5/5, and finger abd 5/5 all B/L  LEs- HF 5-/5, KE and KF 5/5, DF and  PF 5-/5 B/L  Neuro: Sensation to light touch in "a lot better"- now intact to light touch! In all dermatomes.   Still has trigger points in R>L upper traps, levators and rhomboids.      Assessment & Plan:    C3 ASIA C/D Quadriplegia due to spinal cord compression- nontraumatic.Here for f/u. Improved Bowel and bladder. With associated myofascial pain.    1. Tennis balls- use against wall- 2-4 minutes - don't massage- just in hold in place/put pressure on it-  Do this 2-3x/week.   2. Continue stretches with sister at least 3-4x/week.   3. Still using cane, esp when out- furniture walks at home.   4. Continue staying out bed unless sleeping.   5. Doesn't need refills on baclofen today.   6. Continue taking Baclofen 5 mg nightly, but can take 1 tab in morning as well.  Could help muscle tightness.   7. F/U in 6 months- call if needs trigger point injections prior to that.    I spent a total of 20 minutes on appointment- as detailed above.

## 2020-11-15 ENCOUNTER — Encounter: Payer: Self-pay | Admitting: Podiatry

## 2020-11-15 ENCOUNTER — Ambulatory Visit: Payer: Medicare Other | Admitting: Podiatry

## 2020-11-15 ENCOUNTER — Other Ambulatory Visit: Payer: Self-pay

## 2020-11-15 ENCOUNTER — Ambulatory Visit: Payer: Medicare Other

## 2020-11-15 DIAGNOSIS — M5442 Lumbago with sciatica, left side: Secondary | ICD-10-CM

## 2020-11-15 DIAGNOSIS — M5441 Lumbago with sciatica, right side: Secondary | ICD-10-CM

## 2020-11-15 DIAGNOSIS — M7750 Other enthesopathy of unspecified foot: Secondary | ICD-10-CM

## 2020-11-15 DIAGNOSIS — M5430 Sciatica, unspecified side: Secondary | ICD-10-CM | POA: Diagnosis not present

## 2020-11-15 NOTE — Progress Notes (Signed)
Subjective:  Patient ID: Brian Daniels, male    DOB: [birth year],  MRN: [id number] HPI Chief Complaint  Patient presents with  . Foot Pain    Lower extremities bilateral (L>R) - swelling,    65 y.o. male presents with the above complaint.   ROS: Denies fever chills nausea vomiting muscle aches pains calf pain back pain chest pain shortness of breath.  Past Medical History:  Diagnosis Date  . Cataracts, bilateral    removed by surgery  . Diabetes mellitus without complication (HCC)    type 2  . Glaucoma    bilateral  . Gout   . Hypercholesteremia   . Hypertension   . Left atrial enlargement   . Legally blind    bilateral  . Neuropathy    hands, arm  . Numbness    hands, arms  . Wears partial dentures    lower  . Wheelchair bound    since 05/30/19   Past Surgical History:  Procedure Laterality Date  . ANTERIOR CERVICAL DECOMPRESSION/DISCECTOMY FUSION 4 LEVELS N/A 08/05/2019   Procedure: ANTERIOR CERVICAL DECOMPRESSION/DISCECTOMY FUSION CERVICAL THREE- CERVICAL FOUR, CERVICAL FOUR- CERVICAL FIVE, CERVICAL FIVE- CERVICAL SIX, CERVICAL SIX- CERVICAL SEVEN;  Surgeon: Lisbeth Renshaw, MD;  Location: [facility] OR;  Service: Neurosurgery;  Laterality: N/A;  anterior  . COLONOSCOPY    . EYE SURGERY Bilateral    multiple surgeries bilateral -   . MULTIPLE TOOTH EXTRACTIONS    . WISDOM TOOTH EXTRACTION      Current Outpatient Medications:  .  acetaminophen (TYLENOL) 325 MG tablet, Take 2 tablets (650 mg total) by mouth every 4 (four) hours as needed for mild pain ((score 1 to 3) or temp > 100.5)., Disp:  , Rfl:  .  amLODipine (NORVASC) 5 MG tablet, Take 1 tablet by mouth daily., Disp: , Rfl:  .  Ascorbic Acid (VITAMIN C PO), Take 1,000 mg by mouth daily. , Disp: , Rfl:  .  aspirin EC 81 MG tablet, Take 1 tablet (81 mg total) by mouth daily., Disp:  , Rfl:  .  atorvastatin (LIPITOR) 20 MG tablet, Take 1 tablet (20 mg total) by mouth daily., Disp: 30 tablet, Rfl: 1 .  baclofen  (LIORESAL) 10 MG tablet, Take 0.5 tablets (5 mg total) by mouth 3 (three) times daily., Disp: 150 each, Rfl: 3 .  benazepril (LOTENSIN) 10 MG tablet, Take 1 tablet (10 mg total) by mouth daily., Disp: 30 tablet, Rfl: 1 .  bisacodyl (DULCOLAX) 10 MG suppository, Place 1 suppository (10 mg total) rectally daily as needed for moderate constipation., Disp: 12 suppository, Rfl: 0 .  BL CALCIUM-MAGNESIUM-ZINC PO, Take 1 tablet by mouth daily., Disp: , Rfl:  .  brimonidine (ALPHAGAN) 0.2 % ophthalmic solution, Place 1 drop into both eyes 3 (three) times daily., Disp: , Rfl:  .  chlorhexidine (PERIDEX) 0.12 % solution, as directed., Disp: , Rfl:  .  Cyanocobalamin (B-12 PO), Take 5,000 mcg by mouth daily. , Disp: , Rfl:  .  diclofenac Sodium (VOLTAREN) 1 % GEL, Apply 2 g topically 4 (four) times daily as needed (pain)., Disp: 2 g, Rfl: 1 .  DULoxetine (CYMBALTA) 60 MG capsule, Take 1 capsule (60 mg total) by mouth at bedtime., Disp: 30 capsule, Rfl: 3 .  enoxaparin (LOVENOX) 40 MG/0.4ML injection, lovenox 40 mg daily until 10/08/2019 and stop, Disp: 0.4 mL, Rfl: 2 .  enoxaparin (LOVENOX) 40 MG/0.4ML injection, Inject 0.4 mLs (40 mg total) into the skin daily. Quantity 30 syringes, Disp: 30 mL,  Rfl: 0 .  GINSENG PO, Take 2 tablets by mouth daily as needed (energy boost)., Disp: , Rfl:  .  HYDROcodone-acetaminophen (NORCO/VICODIN) 5-325 MG tablet, Take 1 tablet by mouth every 4 (four) hours as needed for moderate pain ((score 4 to 6)). (Patient not taking: Reported on 07/11/2020), Disp: 30 tablet, Rfl: 0 .  indomethacin (INDOCIN) 50 MG capsule, Take 50 mg by mouth 3 (three) times daily as needed., Disp: , Rfl:  .  lidocaine (LIDODERM) 5 %, Place 2 patches onto the skin daily. Remove & Discard patch within 12 hours or as directed by MD, Disp: 60 patch, Rfl: 0 .  metFORMIN (GLUCOPHAGE-XR) 500 MG 24 hr tablet, Take 1 tablet (500 mg total) by mouth at bedtime., Disp: 30 tablet, Rfl: 1 .  methocarbamol (ROBAXIN) 500  MG tablet, Take 1 tablet (500 mg total) by mouth every 6 (six) hours as needed for muscle spasms (L shoulder pain/muscle tightness)., Disp: 100 tablet, Rfl: 5 .  metoprolol tartrate (LOPRESSOR) 25 MG tablet, , Disp: , Rfl:  .  Omega-3 Fatty Acids (FISH OIL) 1000 MG CAPS, Take 1,000 mg by mouth daily. , Disp: , Rfl:  .  prednisoLONE acetate (PRED FORTE) 1 % ophthalmic suspension, Place 1 drop into the right eye 2 (two) times daily., Disp: , Rfl:  .  senna (SENOKOT) 8.6 MG TABS tablet, Take 2 tablets (17.2 mg total) by mouth 2 (two) times daily., Disp: 120 tablet, Rfl: 0 .  tamsulosin (FLOMAX) 0.4 MG CAPS capsule, TAKE 1 CAPSULE(0.4 MG) BY MOUTH DAILY AFTER SUPPER, Disp: 90 capsule, Rfl: 3 .  timolol (TIMOPTIC) 0.25 % ophthalmic solution, 1 drop 2 (two) times daily., Disp: , Rfl:  .  vitamin E 180 MG (400 UNITS) capsule, Take 180 mg by mouth daily. , Disp: , Rfl:   No Known Allergies Review of Systems Objective:  There were no vitals filed for this visit.  General: Well developed, nourished, in no acute distress, alert and oriented x3   Dermatological: Skin is warm, dry and supple bilateral. Nails x 10 are well maintained; remaining integument appears unremarkable at this time. There are no open sores, no preulcerative lesions, no rash or signs of infection present.  Vascular: Dorsalis Pedis artery and Posterior Tibial artery pedal pulses are 1/4 bilateral with immedate capillary fill time. Pedal hair growth present. No varicosities and no lower extremity edema present bilateral.  Mild edema bilateral nonpitting left worse than right  Neruologic: Grossly intact via light touch bilateral. Vibratory intact via tuning fork bilateral. Protective threshold with Semmes Wienstein monofilament diminished to all pedal sites bilateral. Patellar and Achilles deep tendon reflexes 2+ bilateral. No Babinski or clonus noted bilateral.   Musculoskeletal: No gross boney pedal deformities bilateral. No pain,  crepitus, or limitation noted with foot and ankle range of motion bilateral. Muscular strength 4 out of 5 in all groups tested bilateral.  Gait: Unassisted, Nonantalgic.    Radiographs:  None taken  Assessment & Plan:   Assessment: At this point I feel that the bilateral component of his numbness and tingling is associated with his diabetic peripheral neuropathy however the pain that he has radiating up and down his leg into his butt and back are most likely associated with the sciatica.  He just recently had neck surgery and has just started walking again.  States that he is doing fine with the neck and his fingers and arms are no longer hurting.  I think this is most likely sciatica.  Plan: I  think he needs to follow-up with Dr. Conchita Paris and [facility] for evaluation.  We will once again make a referral for him follow-up with me as needed     Lynnie Koehler T. [place], [place]

## 2020-12-14 ENCOUNTER — Telehealth: Payer: Self-pay | Admitting: *Deleted

## 2020-12-14 NOTE — Telephone Encounter (Signed)
Brian Daniels had to change his appt from 01/04/21 to 02/08/21. His sister is calling saying he needs additional PT.  Please advise.

## 2020-12-19 NOTE — Telephone Encounter (Signed)
Left message for Ms Petrenko to call back to the clinic line and let us know where he was getting therapy.

## 2021-01-04 ENCOUNTER — Ambulatory Visit: Payer: Medicare Other | Admitting: Physical Medicine and Rehabilitation

## 2021-02-08 ENCOUNTER — Encounter: Payer: Medicare Other | Admitting: Physical Medicine and Rehabilitation

## 2021-05-08 ENCOUNTER — Other Ambulatory Visit: Payer: Self-pay | Admitting: Physical Medicine and Rehabilitation

## 2021-05-08 NOTE — Telephone Encounter (Signed)
Received a refill request for Tamsulosin. Patient has not been seen since 06/2020 and does not have any other appointments scheduled. Would you like this refilled?

## 2021-06-07 ENCOUNTER — Other Ambulatory Visit: Payer: Self-pay | Admitting: Physical Medicine and Rehabilitation

## 2021-06-25 IMAGING — CT CT HEAD W/O CM
4 series · 15 of 47 positions shown, 17 images · non-contrast
Comparison: None.

CLINICAL DATA: Numbness, weakness of the bilateral hands and lower
extremities for 2 weeks

EXAM:
CT HEAD WITHOUT CONTRAST
CT CERVICAL SPINE WITHOUT CONTRAST
TECHNIQUE: Multidetector CT imaging of the head and cervical spine was
performed following the standard protocol without intravenous
contrast. Multiplanar CT image reconstructions of the cervical spine
were also generated.

[Series 3: head without · axial · non-contrast · 0.44mm/px · z∈[+1323,+1443]mm · 7 of 33 slices shown, 9 images]
[im 5/33  brain]
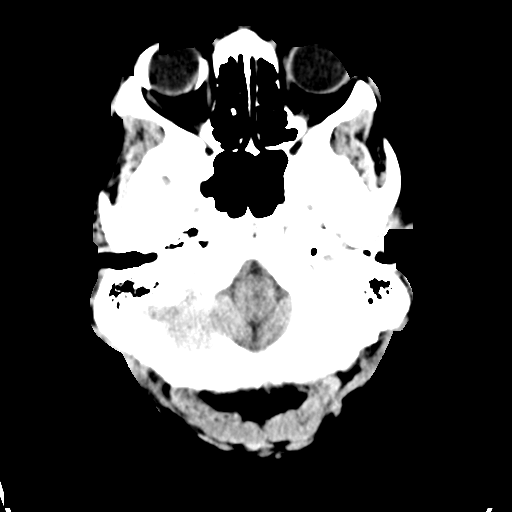
[im 5/33  bone]
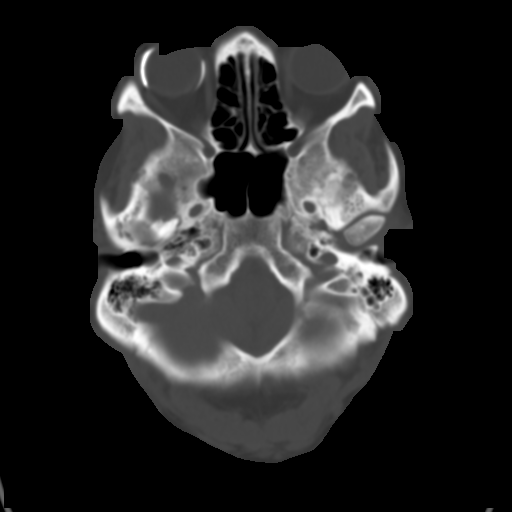
[im 9/33  brain]
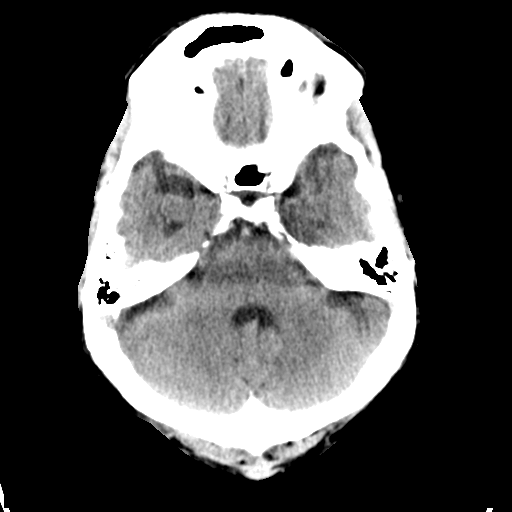
[im 13/33  brain]
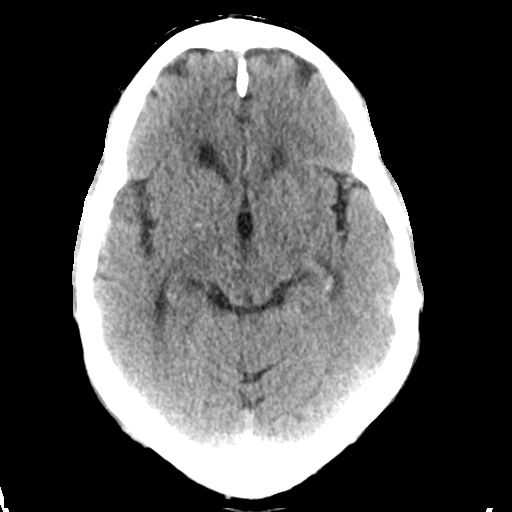
[im 17/33  brain]
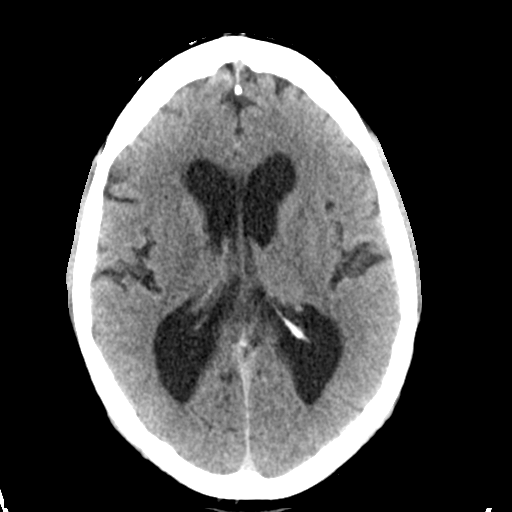
[im 21/33  brain]
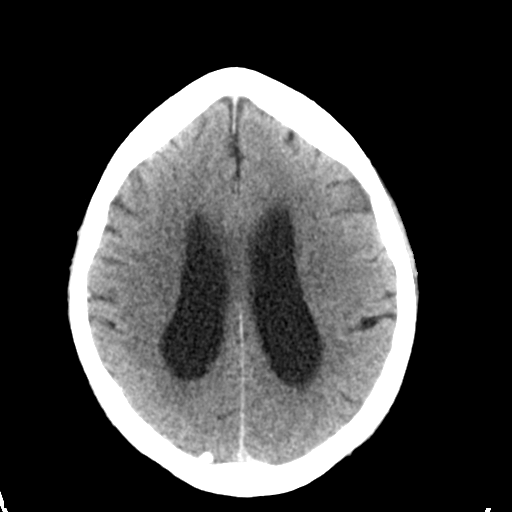
[im 21/33  bone]
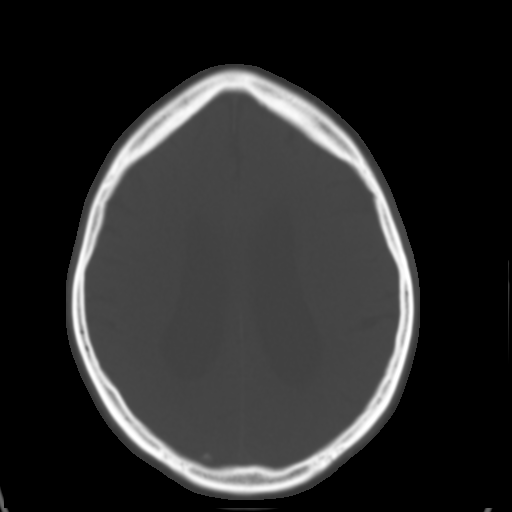
[im 25/33  brain]
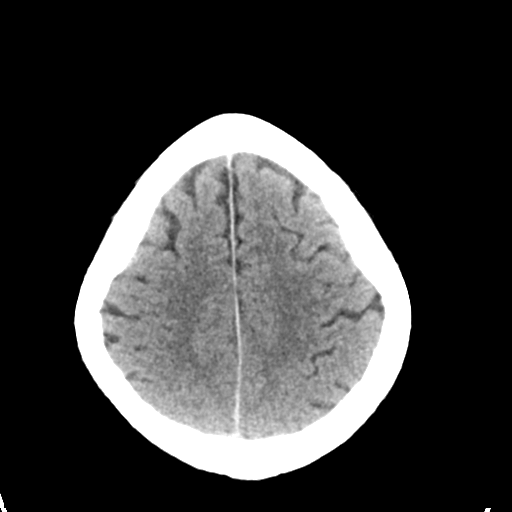
[im 29/33  brain]
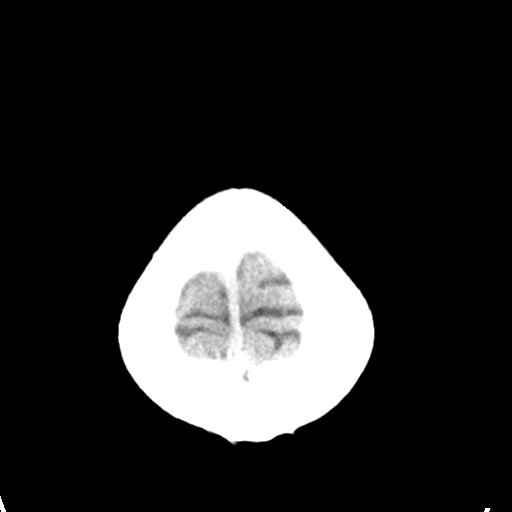

[Series 4: head bone · axial · 0.44mm/px · z∈[+1319,+1335]mm · 2 of 82 slices shown]
[im 9/82  bone]
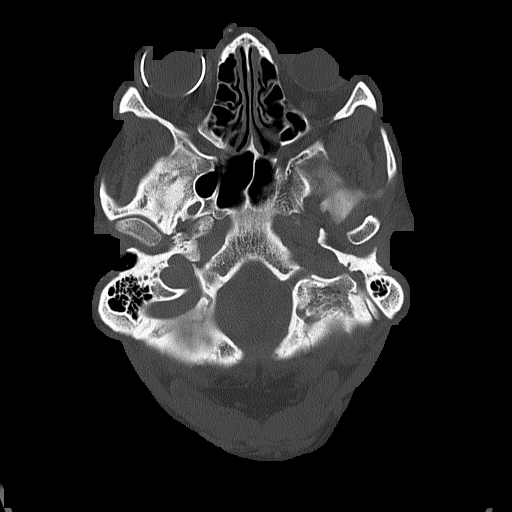
[im 17/82  bone]
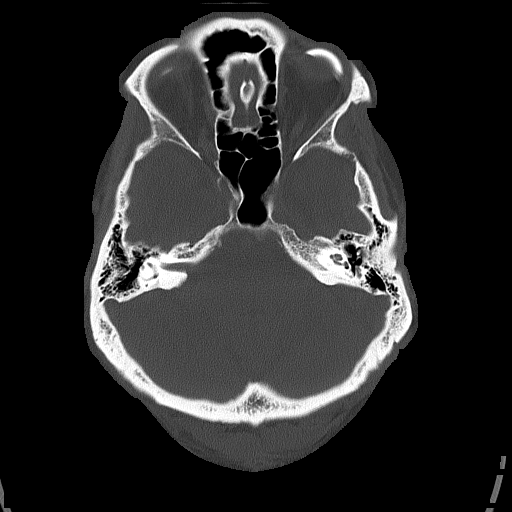

[Series 5: head without cor · coronal · non-contrast · 0.32mm/px · 3 of 73 slices shown]
[im 25/73  brain]
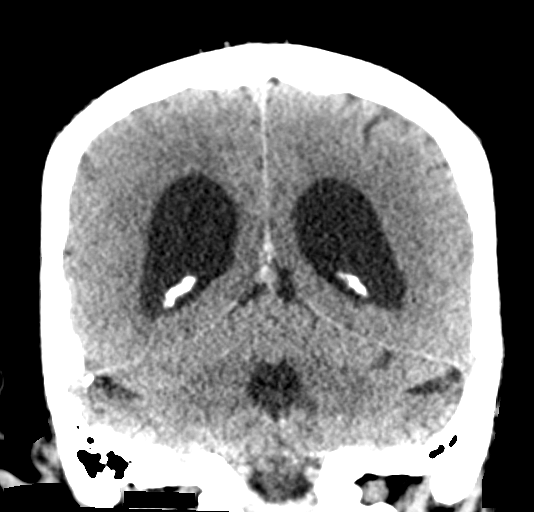
[im 33/73  brain]
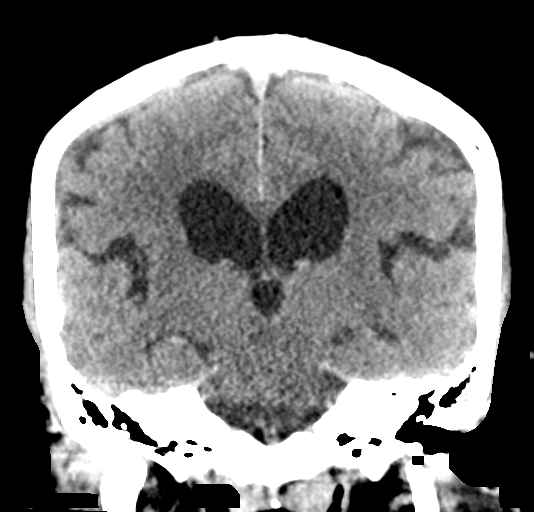
[im 41/73  brain]
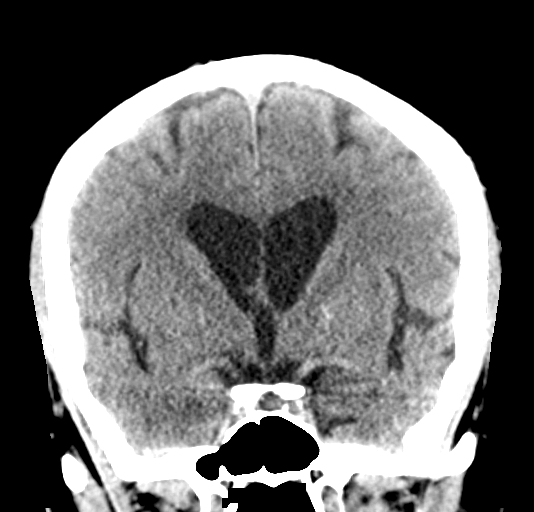

[Series 6: head without sag · sagittal · non-contrast · 0.32mm/px · 3 of 58 slices shown]
[im 20/58  brain]
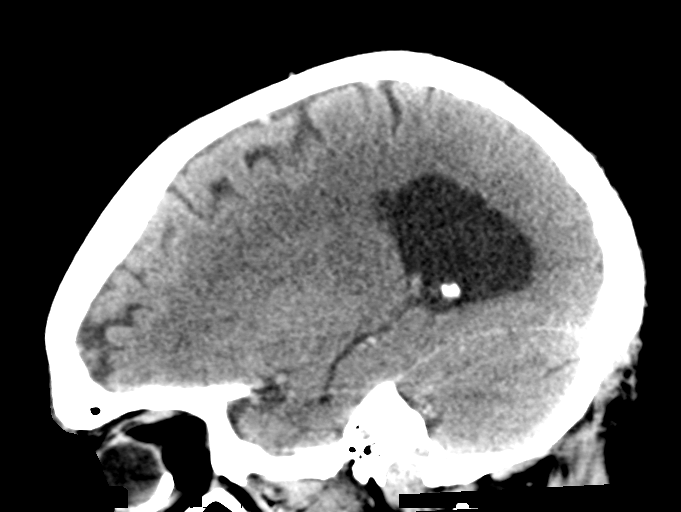
[im 29/58  brain]
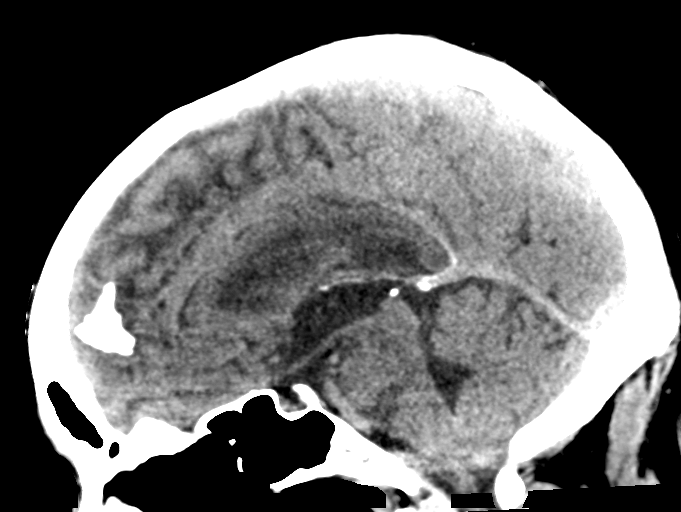
[im 39/58  brain]
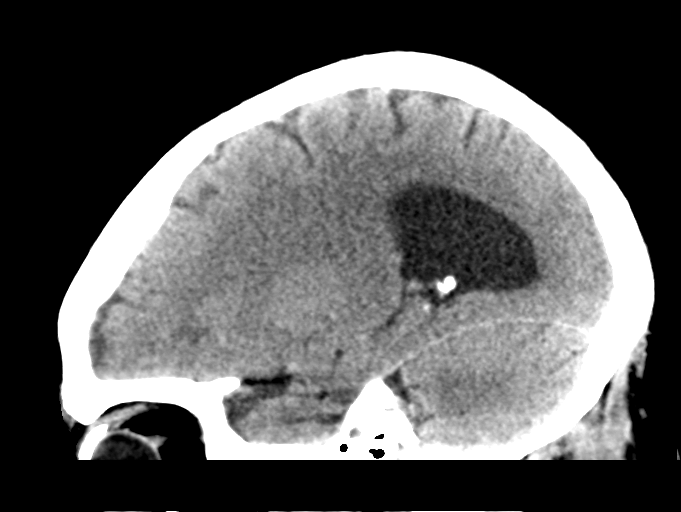

[15 of 47 positions shown; findings below may reference images not displayed]

FINDINGS: CT HEAD FINDINGS

Brain: No evidence of acute infarction, hemorrhage, hydrocephalus,
extra-axial collection or mass lesion/mass effect. Symmetric
prominence of the cisterns and sulci compatible with parenchymal
volume loss. Prominence of the ventricles is out of proportion to
the sulcal dilatation with some upward bowing of the corpus callosum
and slight narrowing of the callosal angle posteriorly. Senescent
mineralization of the basal ganglia. Patchy areas of white matter
hypoattenuation are most compatible with chronic microvascular
angiopathy.

Vascular: Atherosclerotic calcification of the carotid siphons. No
hyperdense vessel.

Skull: No calvarial fracture or suspicious osseous lesion. No scalp
swelling or hematoma.

Sinuses/Orbits: Paranasal sinuses and mastoid air cells are
predominantly clear. Prior lens extractions and bilateral glaucoma
drainage devices are noted.

Other: None

CT CERVICAL SPINE FINDINGS

Alignment: Reversal of the cervical lordosis centered at C6. 3 mm of
degenerative anterolisthesis seen C4 on C5. No abnormal facet
widening. No traumatic listhesis. Craniocervical and atlantoaxial
articulations are normally aligned.

Skull base and vertebrae: No acute fracture. Slight anterior wedging
C5, C6 and minimally at C[DATE] be remote posttraumatic or on a
degenerative basis. No primary bone lesion or focal pathologic
process.

Soft tissues and spinal canal: No pre or paravertebral fluid or
swelling. No visible canal hematoma.

Disc levels: Multilevel intervertebral disc height loss with
spondylitic endplate changes. Mild-to-moderate canal narrowing
C4-C7. Facet hypertrophic changes and uncinate spurring result in
mild-to-moderate foraminal narrowing more pronounced on the right
C5-C7. No severe canal or foraminal narrowing.

Upper chest: Apical atelectatic changes are noted. Vascular calcium
noted in the proximal great vessels.

Other: Normal thyroid.
IMPRESSION: 1. No acute intracranial abnormality.
2. Prominence of the ventricles is out of proportion to the sulcal
dilatation with some upward bowing of the corpus callosum and slight
narrowing of the callosal angle posteriorly. Such findings can be
seen in the setting of normal pressure hydrocephalus. Should
correlate with findings in the clinical assessment.
3. No acute cervical spine fracture or traumatic listhesis.
4. Anterior wedging the C5, C6 and C7 vertebrae possibly remote
posttraumatic or degenerative in nature.
5. Multilevel degenerative changes of the cervical spine with at
most moderate canal stenosis and foraminal narrowing as detailed
above.

## 2021-12-19 ENCOUNTER — Ambulatory Visit: Payer: Medicare Other | Admitting: Podiatry

## 2021-12-19 DIAGNOSIS — M79675 Pain in left toe(s): Secondary | ICD-10-CM

## 2021-12-19 DIAGNOSIS — B351 Tinea unguium: Secondary | ICD-10-CM | POA: Diagnosis not present

## 2021-12-19 DIAGNOSIS — M79674 Pain in right toe(s): Secondary | ICD-10-CM

## 2021-12-19 NOTE — Progress Notes (Signed)
Presents today chief concern of thick nails to the right foot.  Denies any trauma states that been there for quite some time.  Does his have a history of neurological disorder of the back which is causing him to grip with his toes.  Objective: Vital signs are stable alert and oriented x3 pulses are palpable.  Neurologic sensorium is diminished per Semmes Weinstein monofilament.  Hammertoe deformity resulting in distal subungual onycholysis and thickening of the nails right foot.  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  Plan: Debridement of nails bilaterally.  Follow-up with me as needed

## 2022-01-23 ENCOUNTER — Other Ambulatory Visit: Payer: Self-pay | Admitting: Physical Medicine and Rehabilitation

## 2022-07-22 ENCOUNTER — Other Ambulatory Visit: Payer: Self-pay | Admitting: Physical Medicine and Rehabilitation

## 2022-10-03 ENCOUNTER — Other Ambulatory Visit: Payer: Self-pay | Admitting: Neurosurgery

## 2022-10-07 ENCOUNTER — Other Ambulatory Visit: Payer: Self-pay | Admitting: Neurosurgery

## 2022-10-20 NOTE — Pre-Procedure Instructions (Addendum)
Surgical Instructions    Your procedure is scheduled on Friday, October 31, 2022.  Report to [facility] Main Entrance "A" at 5: 30 A.M., then check in with the Admitting office.  Call this number if you have problems the morning of surgery:  [phone]  If you have any questions prior to your surgery date call [phone]: Open Monday-Friday 8am-4pm If you experience any cold or flu symptoms such as cough, fever, chills, shortness of breath, etc. between now and your scheduled surgery, please notify us at the above number.     Remember:  Do not eat or drink after midnight the night before your surgery    Take these medicines the morning of surgery with A SIP OF WATER              acetaminophen (TYLENOL) as needed             amLODipine (NORVASC)              atorvastatin (LIPITOR)             baclofen (LIORESAL) as needed             metoprolol succinate (TOPROL-XL)              tamsulosin (FLOMAX)              brimonidine (ALPHAGAN) 0.2 % eye drops             prednisoLONE acetate (PRED FORTE) 1 % eye drops             sodium chloride (MURO 128) 5 %  eye drops as needed             timolol (TIMOPTIC) 0.5 %              dorzolamide-timolol (COSOPT) 2-0.5 %               Follow your surgeon's instructions on when to stop Aspirin.  If no instructions were given by your surgeon then you will need to call the office to get those instructions.     As of today, STOP taking any Aleve, Naproxen, Ibuprofen, Motrin, Advil, Goody's, BC's, all herbal medications, fish oil, and all vitamins. This includes your medications: meloxicam (MOBIC)  WHAT DO I DO ABOUT MY DIABETES MEDICATION?   Do not take oral diabetes medicines metFORMIN (GLUCOPHAGE-XR)  the morning of surgery.  HOW TO MANAGE YOUR DIABETES BEFORE AND AFTER SURGERY  Why is it important to control my blood sugar before and after surgery? Improving blood sugar levels before and after surgery helps healing and can limit  problems. A way of improving blood sugar control is eating a healthy diet by:  Eating less sugar and carbohydrates  Increasing activity/exercise  Talking with your doctor about reaching your blood sugar goals High blood sugars (greater than 180 mg/dL) can raise your risk of infections and slow your recovery, so you will need to focus on controlling your diabetes during the weeks before surgery. Make sure that the doctor who takes care of your diabetes knows about your planned surgery including the date and location.  How do I manage my blood sugar before surgery? Check your blood sugar at least 4 times a day, starting 2 days before surgery, to make sure that the level is not too high or low.  Check your blood sugar the morning of your surgery when you wake up and every 2 hours until you get to the Short Stay unit.  If your blood sugar is less than 70 mg/dL, you will need to treat for low blood sugar: Do not take insulin. Treat a low blood sugar (less than 70 mg/dL) with  cup of clear juice (cranberry or apple), 4 glucose tablets, OR glucose gel. Recheck blood sugar in 15 minutes after treatment (to make sure it is greater than 70 mg/dL). If your blood sugar is not greater than 70 mg/dL on recheck, call [phone] for further instructions. Report your blood sugar to the short stay nurse when you get to Short Stay.  If you are admitted to the hospital after surgery: Your blood sugar will be checked by the staff and you will probably be given insulin after surgery (instead of oral diabetes medicines) to make sure you have good blood sugar levels. The goal for blood sugar control after surgery is 80-180 mg/dL.                      Do NOT Smoke (Tobacco/Vaping) for 24 hours prior to your procedure.  If you use a CPAP at night, you may bring your mask/headgear for your overnight stay.   Contacts, glasses, piercing's, hearing aid's, dentures or partials may not be worn into surgery, please  bring cases for these belongings.    For patients admitted to the hospital, discharge time will be determined by your treatment team.   Patients discharged the day of surgery will not be allowed to drive home, and someone needs to stay with them for 24 hours.  SURGICAL WAITING ROOM VISITATION Patients having surgery or a procedure may have no more than 2 support people in the waiting area - these visitors may rotate.   Children under the age of 58 must have an adult with them who is not the patient. If the patient needs to stay at the hospital during part of their recovery, the visitor guidelines for inpatient rooms apply. Pre-op nurse will coordinate an appropriate time for 1 support person to accompany patient in pre-op.  This support person may not rotate.   Please refer to the [facility] website for the visitor guidelines for Inpatients (after your surgery is over and you are in a regular room).    Special instructions:  please refer to the handout you received for instructions on preparing for surgery with CHG surgical soap.    If you received a COVID test during your pre-op visit  it is requested that you wear a mask when out in public, stay away from anyone that may not be feeling well and notify your surgeon if you develop symptoms. If you have been in contact with anyone that has tested positive in the last 10 days please notify you surgeon.

## 2022-10-21 ENCOUNTER — Other Ambulatory Visit: Payer: Self-pay

## 2022-10-21 ENCOUNTER — Encounter (HOSPITAL_COMMUNITY)
Admission: RE | Admit: 2022-10-21 | Discharge: 2022-10-21 | Disposition: A | Discharge status: Home / Self Care | Payer: Medicare Other | Source: Ambulatory Visit | Attending: Neurosurgery | Admitting: Neurosurgery

## 2022-10-21 ENCOUNTER — Encounter (HOSPITAL_COMMUNITY): Payer: Self-pay

## 2022-10-21 VITALS — BP 148/81 | HR 66 | Temp 98.2°F | Resp 18 | Ht 70.0 in | Wt 167.1 lb

## 2022-10-21 DIAGNOSIS — E119 Type 2 diabetes mellitus without complications: Secondary | ICD-10-CM | POA: Diagnosis not present

## 2022-10-21 DIAGNOSIS — Z01818 Encounter for other preprocedural examination: Secondary | ICD-10-CM | POA: Diagnosis present

## 2022-10-21 HISTORY — DX: Cardiac murmur, unspecified: R01.1

## 2022-10-21 LAB — BASIC METABOLIC PANEL
Anion gap: 9 (ref 5–15)
BUN: 18 mg/dL (ref 8–23)
CO2: 27 mmol/L (ref 22–32)
Calcium: 9.5 mg/dL (ref 8.9–10.3)
Chloride: 105 mmol/L (ref 98–111)
Creatinine, Ser: 0.96 mg/dL (ref 0.61–1.24)
GFR, Estimated: >60 mL/min (ref >60)
Glucose, Bld: 94 mg/dL (ref 70–99)
Potassium: 4 mmol/L (ref 3.5–5.1)
Sodium: 141 mmol/L (ref 135–145)

## 2022-10-21 LAB — TYPE AND SCREEN
ABO/RH(D): O POS
Antibody Screen: NEGATIVE

## 2022-10-21 LAB — GLUCOSE, CAPILLARY: Glucose-Capillary: 94 mg/dL (ref 70–99)

## 2022-10-21 LAB — CBC
HCT: 45.6 % (ref 39.0–52.0)
Hemoglobin: 14.9 g/dL (ref 13.0–17.0)
MCH: 26.9 pg (ref 26.0–34.0)
MCHC: 32.7 g/dL (ref 30.0–36.0)
MCV: 82.5 fL (ref 80.0–100.0)
Platelets: 168 10*3/uL (ref 150–400)
RBC: 5.53 MIL/uL (ref 4.22–5.81)
RDW: 13.6 % (ref 11.5–15.5)
WBC: 6.3 10*3/uL (ref 4.0–10.5)
nRBC: 0 % (ref 0.0–0.2)

## 2022-10-21 LAB — SURGICAL PCR SCREEN
MRSA, PCR: NEGATIVE
Staphylococcus aureus: NEGATIVE

## 2022-10-21 LAB — HEMOGLOBIN A1C
Hgb A1c MFr Bld: 6.2 % — ABNORMAL HIGH (ref 4.8–5.6)
Mean Plasma Glucose: 131.24 mg/dL

## 2022-10-21 NOTE — Progress Notes (Signed)
PCP - [facility] Cardiologist - denies  PPM/ICD - denies   Chest x-ray - denies EKG - 10/21/2022 Stress Test - 05/2022 per pt ECHO -  01/ 2024 per pt Cardiac Cath - denies  Sleep Study - denies CPAP - n/a  Fasting Blood Sugar - 95-100 Checks Blood Sugar once in 2 weeks  Last dose of GLP1 agonist-  denies GLP1 instructions: n/a   Blood Thinner Instructions: n/a Aspirin Instructions: instructed to follow surgeon's orders  ERAS Protcol - no; NPO at midnight PRE-SURGERY Ensure or G2- no  COVID TEST- n/a   Anesthesia review: yes; per pt he had heart murmurs several years ago. Marylene Land with anesthesia is aware.   Patient denies shortness of breath, fever, cough and chest pain at PAT appointment and for the last two months.   All instructions explained to the patient, with a verbal understanding of the material. Patient agrees to go over the instructions while at home for a better understanding. Patient also instructed to self quarantine after being tested for COVID-19. The opportunity to ask questions was provided.  Pt states that he had an echocardiogram and EKG in January of this year. Records requested from [facility]

## 2022-10-22 NOTE — Progress Notes (Signed)
Anesthesia Chart Review:   Case: [id number] Date/Time: 10/31/22 0716   Procedures:      PRONE TRANS-PSOAS LATERAL INTERBODY FUSION, L23, L34, L2-4 LAMINECTOMY WITH INSTRUMENTATION     Application of O-Arm   Anesthesia type: General   Pre-op diagnosis: SPINAL STENOSIS, LUMBAR REGION WITH NEUROGENIC CLAUDICATION   [facility] ROOM 18 / [facility]   Surgeons: Lisbeth Renshaw, MD       DISCUSSION: Pt is 67 years old with hx HTN, DM.   Pt reported someone told him he had a heart murmur several years ago. I listened to heart sounds in pre-admission testing and I do not hear a murmur; HRRR.   Is legally blind  Last office visit note with PCP states pt needs to see cardiology and have an echo due to coronary artery calcifications seen on lumber x-ray. Per [facility], pt declined referral to cardiology and did not have echo. Nikki in Dr. Val Riles office to request PCP clearance for surgery.   VS: BP (!) 148/81   Pulse 66   Temp 36.8 C   Resp 18   Ht 5\' 10"  (1.778 m)   Wt 75.8 kg   SpO2 99%   BMI 23.98 kg/m   PROVIDERS: - PCP is Knox Royalty, MD   LABS: Labs reviewed: Acceptable for surgery. (all labs ordered are listed, but only abnormal results are displayed)  Labs Reviewed  HEMOGLOBIN A1C - Abnormal; Notable for the following components:      Result Value   Hgb A1c MFr Bld 6.2 (*)    All other components within normal limits  SURGICAL PCR SCREEN  GLUCOSE, CAPILLARY  CBC  BASIC METABOLIC PANEL  TYPE AND SCREEN    EKG 10/21/22: NSR   CV: none available   Past Medical History:  Diagnosis Date   Cataracts, bilateral    removed by surgery   Diabetes mellitus without complication    type 2   Glaucoma    bilateral   Gout    Heart murmur    several years ago   Hypercholesteremia    Hypertension    Left atrial enlargement    Legally blind    bilateral   Neuropathy    hands, arm   Numbness    hands, arms   Wears partial dentures    lower    Wheelchair bound    since 05/30/19    Past Surgical History:  Procedure Laterality Date   ANTERIOR CERVICAL DECOMPRESSION/DISCECTOMY FUSION 4 LEVELS N/A 08/05/2019   Procedure: ANTERIOR CERVICAL DECOMPRESSION/DISCECTOMY FUSION CERVICAL THREE- CERVICAL FOUR, CERVICAL FOUR- CERVICAL FIVE, CERVICAL FIVE- CERVICAL SIX, CERVICAL SIX- CERVICAL SEVEN;  Surgeon: Lisbeth Renshaw, MD;  Location: [facility];  Service: Neurosurgery;  Laterality: N/A;  anterior   COLONOSCOPY     EYE SURGERY Bilateral    multiple surgeries bilateral -    MULTIPLE TOOTH EXTRACTIONS     WISDOM TOOTH EXTRACTION      MEDICATIONS:  acetaminophen (TYLENOL) 325 MG tablet   amLODipine (NORVASC) 5 MG tablet   Ascorbic Acid (VITAMIN C) 1000 MG tablet   aspirin EC 81 MG tablet   atorvastatin (LIPITOR) 20 MG tablet   baclofen (LIORESAL) 10 MG tablet   benazepril (LOTENSIN) 10 MG tablet   bisacodyl (DULCOLAX) 10 MG suppository   BL CALCIUM-MAGNESIUM-ZINC PO   brimonidine (ALPHAGAN) 0.2 % ophthalmic solution   Cyanocobalamin (B-12) 5000 MCG CAPS   diclofenac Sodium (VOLTAREN) 1 % GEL   dorzolamide-timolol (COSOPT) 2-0.5 % ophthalmic solution  DULoxetine (CYMBALTA) 60 MG capsule   enoxaparin (LOVENOX) 40 MG/0.4ML injection   enoxaparin (LOVENOX) 40 MG/0.4ML injection   GINSENG PO   HYDROcodone-acetaminophen (NORCO/VICODIN) 5-325 MG tablet   lidocaine (LIDODERM) 5 %   meloxicam (MOBIC) 15 MG tablet   metFORMIN (GLUCOPHAGE-XR) 500 MG 24 hr tablet   methocarbamol (ROBAXIN) 500 MG tablet   metoprolol succinate (TOPROL-XL) 25 MG 24 hr tablet   Omega-3 Fatty Acids (FISH OIL) 1000 MG CAPS   OVER THE COUNTER MEDICATION   prednisoLONE acetate (PRED FORTE) 1 % ophthalmic suspension   senna (SENOKOT) 8.6 MG TABS tablet   sodium chloride (MURO 128) 5 % ophthalmic solution   tamsulosin (FLOMAX) 0.4 MG CAPS capsule   timolol (TIMOPTIC) 0.5 % ophthalmic solution   vitamin E 180 MG (400 UNITS) capsule   No current  facility-administered medications for this encounter.    Rica MastAngela Odie Rauen, PhD, FNP-BC [facility]iology Phone: [phone] 10/24/2022 3:46 PM    ADDENDUM 10/28/22 9:29 AM: See above note by Rica MastKabbe, Mima Cranmore, FNP-BC. Since P[facility] Knox Royal[facility]Jones, Enrico, [facility] patient to cardiology for finding of coronary calcifications on xray images (and not for active CV symptoms), Dr. Yetta BarreJones was asked to provide clearance for surgery and would defer timing of cardiology evaluation to Dr. Yetta BarreJones. He saw Mr. Garner NashDaniels on 10/23/22 for preoperative surgical clearance. Patient denied chest pain, DOE, palpitations, edema. No murmur noted on his exam. Dr. Yetta BarreJones cleared patient for lumbar fusion on 10/31/22 and gave permission to hold ASA for 7-10 days before surgery.   Of note, Mr. Garner NashDaniels did have an echo (medical records had previously said he did not have the echo) on 09/02/22. Results showed:  Echo 09/02/22 [facility] Conclusions: 1.  Normal study: Normal cardiac chamber sizes and function; normal valve anatomy and function; no pericardial effusion or intracardiac mass.  No intracardiac shunts by 2D and color-flow imaging.  Normal thoracic aorta and aortic arch.  Anesthesia team to evaluate on the day of surgery.   Shonna ChockAllison Zelenak, PA-C Surgical Short Stay/Anesthesiology [facility] Phone [phone] [facility] Phone [phone] 10/28/2022 10:07 AM

## 2022-10-28 NOTE — Anesthesia Preprocedure Evaluation (Addendum)
Anesthesia Evaluation  Patient identified by MRN, date of birth, ID band Patient awake    Reviewed: Allergy & Precautions, NPO status , Patient's Chart, lab work & pertinent test results, reviewed documented beta blocker date and time   Airway Mallampati: II  TM Distance: >3 FB Neck ROM: Full    Dental no notable dental hx. (+) Teeth Intact, Dental Advisory Given   Pulmonary neg pulmonary ROS   Pulmonary exam normal breath sounds clear to auscultation       Cardiovascular hypertension, Pt. on medications and Pt. on home beta blockers Normal cardiovascular exam Rhythm:Regular Rate:Normal  Echo 09/02/22 [facility]): Conclusions: 1.  Normal study: Normal cardiac chamber sizes and function; normal valve anatomy and function; no pericardial effusion or intracardiac mass.  No intracardiac shunts by 2D and color-flow imaging.  Normal thoracic aorta and aortic arch.    Neuro/Psych negative neurological ROS  negative psych ROS   GI/Hepatic negative GI ROS, Neg liver ROS,,,  Endo/Other  diabetes, Type 2, Oral Hypoglycemic Agents    Renal/GU negative Renal ROS  negative genitourinary   Musculoskeletal negative musculoskeletal ROS (+)    Abdominal   Peds  Hematology negative hematology ROS (+)   Anesthesia Other Findings Legally blind  Reproductive/Obstetrics                             Anesthesia Physical Anesthesia Plan  ASA: 2  Anesthesia Plan: General   Post-op Pain Management: Tylenol PO (pre-op)*   Induction: Intravenous  PONV Risk Score and Plan: 2 and Midazolam, Dexamethasone and Ondansetron  Airway Management Planned: Oral ETT  Additional Equipment:   Intra-op Plan:   Post-operative Plan: Extubation in OR  Informed Consent: I have reviewed the patients History and Physical, chart, labs and discussed the procedure including the risks, benefits and alternatives for the  proposed anesthesia with the patient or authorized representative who has indicated his/her understanding and acceptance.     Dental advisory given  Plan Discussed with: CRNA  Anesthesia Plan Comments: (PAT note written by Rica Mast, FNP-BC with update by Shonna Chock, PA-C.  )       Anesthesia Quick Evaluation

## 2022-10-31 ENCOUNTER — Inpatient Hospital Stay (HOSPITAL_COMMUNITY): Payer: Medicare Other

## 2022-10-31 ENCOUNTER — Encounter (HOSPITAL_COMMUNITY): Payer: Self-pay | Admitting: Neurosurgery

## 2022-10-31 ENCOUNTER — Inpatient Hospital Stay (HOSPITAL_COMMUNITY): Payer: Medicare Other | Admitting: Emergency Medicine

## 2022-10-31 ENCOUNTER — Inpatient Hospital Stay (HOSPITAL_COMMUNITY)
Admission: RE | Disposition: A | Discharge status: Rehabilitation Facility | Payer: Self-pay | Source: Home / Self Care | Attending: Neurosurgery

## 2022-10-31 ENCOUNTER — Other Ambulatory Visit: Payer: Self-pay

## 2022-10-31 ENCOUNTER — Inpatient Hospital Stay (HOSPITAL_COMMUNITY)
Admission: RE | Admit: 2022-10-31 | Discharge: 2022-11-04 | DRG: 454 | Disposition: A | Discharge status: Rehabilitation Facility | Payer: Medicare Other | Attending: Neurosurgery | Admitting: Neurosurgery

## 2022-10-31 ENCOUNTER — Inpatient Hospital Stay (HOSPITAL_COMMUNITY): Payer: Medicare Other | Admitting: Certified Registered Nurse Anesthetist

## 2022-10-31 DIAGNOSIS — I10 Essential (primary) hypertension: Secondary | ICD-10-CM | POA: Diagnosis present

## 2022-10-31 DIAGNOSIS — M5136 Other intervertebral disc degeneration, lumbar region: Secondary | ICD-10-CM | POA: Diagnosis not present

## 2022-10-31 DIAGNOSIS — E7800 Pure hypercholesterolemia, unspecified: Secondary | ICD-10-CM | POA: Diagnosis present

## 2022-10-31 DIAGNOSIS — Z7982 Long term (current) use of aspirin: Secondary | ICD-10-CM

## 2022-10-31 DIAGNOSIS — Z981 Arthrodesis status: Secondary | ICD-10-CM | POA: Diagnosis not present

## 2022-10-31 DIAGNOSIS — M109 Gout, unspecified: Secondary | ICD-10-CM | POA: Diagnosis present

## 2022-10-31 DIAGNOSIS — M4716 Other spondylosis with myelopathy, lumbar region: Secondary | ICD-10-CM | POA: Diagnosis present

## 2022-10-31 DIAGNOSIS — M5106 Intervertebral disc disorders with myelopathy, lumbar region: Secondary | ICD-10-CM | POA: Diagnosis present

## 2022-10-31 DIAGNOSIS — K5900 Constipation, unspecified: Secondary | ICD-10-CM | POA: Diagnosis not present

## 2022-10-31 DIAGNOSIS — M48062 Spinal stenosis, lumbar region with neurogenic claudication: Secondary | ICD-10-CM | POA: Diagnosis present

## 2022-10-31 DIAGNOSIS — Z79899 Other long term (current) drug therapy: Secondary | ICD-10-CM

## 2022-10-31 DIAGNOSIS — Z7984 Long term (current) use of oral hypoglycemic drugs: Secondary | ICD-10-CM

## 2022-10-31 DIAGNOSIS — G959 Disease of spinal cord, unspecified: Secondary | ICD-10-CM | POA: Diagnosis not present

## 2022-10-31 DIAGNOSIS — G8929 Other chronic pain: Secondary | ICD-10-CM | POA: Diagnosis present

## 2022-10-31 DIAGNOSIS — E1140 Type 2 diabetes mellitus with diabetic neuropathy, unspecified: Secondary | ICD-10-CM | POA: Diagnosis present

## 2022-10-31 DIAGNOSIS — R339 Retention of urine, unspecified: Secondary | ICD-10-CM | POA: Diagnosis not present

## 2022-10-31 DIAGNOSIS — H548 Legal blindness, as defined in USA: Secondary | ICD-10-CM | POA: Diagnosis present

## 2022-10-31 DIAGNOSIS — Z833 Family history of diabetes mellitus: Secondary | ICD-10-CM | POA: Diagnosis not present

## 2022-10-31 DIAGNOSIS — G992 Myelopathy in diseases classified elsewhere: Secondary | ICD-10-CM | POA: Diagnosis not present

## 2022-10-31 DIAGNOSIS — H409 Unspecified glaucoma: Secondary | ICD-10-CM | POA: Diagnosis present

## 2022-10-31 DIAGNOSIS — Z993 Dependence on wheelchair: Secondary | ICD-10-CM | POA: Diagnosis not present

## 2022-10-31 DIAGNOSIS — Z83438 Family history of other disorder of lipoprotein metabolism and other lipidemia: Secondary | ICD-10-CM | POA: Diagnosis not present

## 2022-10-31 DIAGNOSIS — M48061 Spinal stenosis, lumbar region without neurogenic claudication: Principal | ICD-10-CM | POA: Diagnosis present

## 2022-10-31 DIAGNOSIS — Z791 Long term (current) use of non-steroidal anti-inflammatories (NSAID): Secondary | ICD-10-CM

## 2022-10-31 DIAGNOSIS — R3911 Hesitancy of micturition: Secondary | ICD-10-CM | POA: Diagnosis not present

## 2022-10-31 DIAGNOSIS — M47816 Spondylosis without myelopathy or radiculopathy, lumbar region: Secondary | ICD-10-CM

## 2022-10-31 DIAGNOSIS — Z8249 Family history of ischemic heart disease and other diseases of the circulatory system: Secondary | ICD-10-CM | POA: Diagnosis not present

## 2022-10-31 HISTORY — PX: ANTERIOR LAT LUMBAR FUSION: SHX1168

## 2022-10-31 LAB — GLUCOSE, CAPILLARY
Glucose-Capillary: 105 mg/dL — ABNORMAL HIGH (ref 70–99)
Glucose-Capillary: 108 mg/dL — ABNORMAL HIGH (ref 70–99)
Glucose-Capillary: 130 mg/dL — ABNORMAL HIGH (ref 70–99)
Glucose-Capillary: 136 mg/dL — ABNORMAL HIGH (ref 70–99)
Glucose-Capillary: 144 mg/dL — ABNORMAL HIGH (ref 70–99)

## 2022-10-31 SURGERY — ANTERIOR LATERAL LUMBAR FUSION 2 LEVELS
Anesthesia: General

## 2022-10-31 MED ORDER — PHENOL 1.4 % MT LIQD: 1.0000 | OROMUCOSAL | Status: DC | PRN

## 2022-10-31 MED ORDER — METHOCARBAMOL 500 MG PO TABS
500.0000 mg | ORAL_TABLET | Freq: Four times a day (QID) | ORAL | Status: DC | PRN
  Administered 2022-10-31 – 2022-11-04 (×9): 500 mg via ORAL
  Filled 2022-10-31 (×9): qty 1

## 2022-10-31 MED ORDER — METHOCARBAMOL 1000 MG/10ML IJ SOLN: 500.0000 mg | Freq: Four times a day (QID) | INTRAVENOUS | Status: DC | PRN

## 2022-10-31 MED ORDER — ACETAMINOPHEN 650 MG RE SUPP: 650.0000 mg | RECTAL | Status: DC | PRN

## 2022-10-31 MED ORDER — INSULIN ASPART 100 UNIT/ML IJ SOLN: 0.0000 [IU] | INTRAMUSCULAR | Status: DC | PRN

## 2022-10-31 MED ORDER — SODIUM CHLORIDE 0.9% FLUSH: 3.0000 mL | INTRAVENOUS | Status: DC | PRN

## 2022-10-31 MED ORDER — ROCURONIUM BROMIDE 10 MG/ML (PF) SYRINGE
PREFILLED_SYRINGE | INTRAVENOUS | Status: AC
  Filled 2022-10-31: qty 10

## 2022-10-31 MED ORDER — OXYCODONE HCL 5 MG PO TABS
10.0000 mg | ORAL_TABLET | ORAL | Status: DC | PRN
  Administered 2022-10-31 – 2022-11-04 (×8): 10 mg via ORAL
  Filled 2022-10-31 (×8): qty 2

## 2022-10-31 MED ORDER — BACITRACIN ZINC 500 UNIT/GM EX OINT
TOPICAL_OINTMENT | CUTANEOUS | Status: DC | PRN
  Administered 2022-10-31: 1 via TOPICAL

## 2022-10-31 MED ORDER — SENNA 8.6 MG PO TABS
1.0000 | ORAL_TABLET | Freq: Two times a day (BID) | ORAL | Status: DC
  Administered 2022-10-31 – 2022-11-04 (×8): 8.6 mg via ORAL
  Filled 2022-10-31 (×8): qty 1

## 2022-10-31 MED ORDER — CHLORHEXIDINE GLUCONATE 0.12 % MT SOLN
15.0000 mL | Freq: Once | OROMUCOSAL | Status: AC
  Administered 2022-10-31: 15 mL via OROMUCOSAL
  Filled 2022-10-31: qty 15

## 2022-10-31 MED ORDER — LIDOCAINE-EPINEPHRINE 1 %-1:100000 IJ SOLN
INTRAMUSCULAR | Status: AC
  Filled 2022-10-31: qty 1

## 2022-10-31 MED ORDER — CHLORHEXIDINE GLUCONATE CLOTH 2 % EX PADS: 6.0000 | MEDICATED_PAD | Freq: Once | CUTANEOUS | Status: DC

## 2022-10-31 MED ORDER — EPHEDRINE SULFATE-NACL 50-0.9 MG/10ML-% IV SOSY
PREFILLED_SYRINGE | INTRAVENOUS | Status: DC | PRN
  Administered 2022-10-31: 5 mg via INTRAVENOUS

## 2022-10-31 MED ORDER — BACITRACIN ZINC 500 UNIT/GM EX OINT
TOPICAL_OINTMENT | CUTANEOUS | Status: AC
  Filled 2022-10-31: qty 28.35

## 2022-10-31 MED ORDER — FENTANYL CITRATE (PF) 100 MCG/2ML IJ SOLN
INTRAMUSCULAR | Status: AC
  Filled 2022-10-31: qty 2

## 2022-10-31 MED ORDER — MENTHOL 3 MG MT LOZG: 1.0000 | LOZENGE | OROMUCOSAL | Status: DC | PRN

## 2022-10-31 MED ORDER — PROPOFOL 500 MG/50ML IV EMUL
INTRAVENOUS | Status: DC | PRN
  Administered 2022-10-31: 120 mg via INTRAVENOUS
  Administered 2022-10-31: 50 ug/kg/min via INTRAVENOUS

## 2022-10-31 MED ORDER — BUPIVACAINE HCL (PF) 0.5 % IJ SOLN
INTRAMUSCULAR | Status: DC | PRN
  Administered 2022-10-31: 10 mL

## 2022-10-31 MED ORDER — DOCUSATE SODIUM 100 MG PO CAPS
100.0000 mg | ORAL_CAPSULE | Freq: Two times a day (BID) | ORAL | Status: DC
  Administered 2022-10-31 – 2022-11-04 (×8): 100 mg via ORAL
  Filled 2022-10-31 (×8): qty 1

## 2022-10-31 MED ORDER — LACTATED RINGERS IV SOLN: INTRAVENOUS | Status: DC

## 2022-10-31 MED ORDER — MORPHINE SULFATE (PF) 2 MG/ML IV SOLN: 2.0000 mg | INTRAVENOUS | Status: DC | PRN

## 2022-10-31 MED ORDER — SODIUM CHLORIDE 0.9 % IV SOLN: 250.0000 mL | INTRAVENOUS | Status: DC

## 2022-10-31 MED ORDER — METFORMIN HCL ER 500 MG PO TB24
500.0000 mg | ORAL_TABLET | Freq: Every day | ORAL | Status: DC
  Administered 2022-11-01 – 2022-11-04 (×4): 500 mg via ORAL
  Filled 2022-10-31 (×4): qty 1

## 2022-10-31 MED ORDER — THROMBIN 5000 UNITS EX SOLR
OROMUCOSAL | Status: DC | PRN
  Administered 2022-10-31 (×2): 5 mL via TOPICAL

## 2022-10-31 MED ORDER — BENAZEPRIL HCL 5 MG PO TABS
10.0000 mg | ORAL_TABLET | Freq: Every day | ORAL | Status: DC
  Administered 2022-11-01 – 2022-11-04 (×4): 10 mg via ORAL
  Filled 2022-10-31 (×4): qty 2

## 2022-10-31 MED ORDER — THROMBIN 5000 UNITS EX SOLR
CUTANEOUS | Status: AC
  Filled 2022-10-31: qty 5000

## 2022-10-31 MED ORDER — PROPOFOL 10 MG/ML IV BOLUS
INTRAVENOUS | Status: DC | PRN
  Administered 2022-10-31: 130 mg via INTRAVENOUS
  Administered 2022-10-31: 20 mg via INTRAVENOUS

## 2022-10-31 MED ORDER — BISACODYL 10 MG RE SUPP: 10.0000 mg | Freq: Every day | RECTAL | Status: DC | PRN

## 2022-10-31 MED ORDER — OXYCODONE HCL 5 MG PO TABS
ORAL_TABLET | ORAL | Status: AC
  Filled 2022-10-31: qty 1

## 2022-10-31 MED ORDER — TIMOLOL MALEATE 0.5 % OP SOLN
1.0000 [drp] | Freq: Two times a day (BID) | OPHTHALMIC | Status: DC
  Administered 2022-10-31 – 2022-11-04 (×8): 1 [drp] via OPHTHALMIC
  Filled 2022-10-31: qty 5

## 2022-10-31 MED ORDER — LACTATED RINGERS IV SOLN: INTRAVENOUS | Status: DC | PRN

## 2022-10-31 MED ORDER — DEXAMETHASONE SODIUM PHOSPHATE 10 MG/ML IJ SOLN
INTRAMUSCULAR | Status: DC | PRN
  Administered 2022-10-31: 4 mg via INTRAVENOUS

## 2022-10-31 MED ORDER — ARTIFICIAL TEARS OPHTHALMIC OINT
TOPICAL_OINTMENT | OPHTHALMIC | Status: DC | PRN
  Administered 2022-10-31: 1 via OPHTHALMIC

## 2022-10-31 MED ORDER — ATORVASTATIN CALCIUM 10 MG PO TABS
20.0000 mg | ORAL_TABLET | Freq: Every day | ORAL | Status: DC
  Administered 2022-11-01 – 2022-11-04 (×4): 20 mg via ORAL
  Filled 2022-10-31 (×4): qty 2

## 2022-10-31 MED ORDER — SUGAMMADEX SODIUM 200 MG/2ML IV SOLN
INTRAVENOUS | Status: DC | PRN
  Administered 2022-10-31: 200 mg via INTRAVENOUS

## 2022-10-31 MED ORDER — FENTANYL CITRATE (PF) 250 MCG/5ML IJ SOLN
INTRAMUSCULAR | Status: DC | PRN
  Administered 2022-10-31 (×5): 50 ug via INTRAVENOUS

## 2022-10-31 MED ORDER — CEFAZOLIN SODIUM-DEXTROSE 2-4 GM/100ML-% IV SOLN
2.0000 g | INTRAVENOUS | Status: AC
  Administered 2022-10-31 (×2): 2 g via INTRAVENOUS
  Filled 2022-10-31: qty 100

## 2022-10-31 MED ORDER — ONDANSETRON HCL 4 MG/2ML IJ SOLN: 4.0000 mg | Freq: Four times a day (QID) | INTRAMUSCULAR | Status: DC | PRN

## 2022-10-31 MED ORDER — BACLOFEN 10 MG PO TABS
5.0000 mg | ORAL_TABLET | Freq: Every day | ORAL | Status: DC | PRN
  Administered 2022-10-31 – 2022-11-03 (×5): 5 mg via ORAL
  Filled 2022-10-31 (×5): qty 1

## 2022-10-31 MED ORDER — ONDANSETRON HCL 4 MG PO TABS: 4.0000 mg | ORAL_TABLET | Freq: Four times a day (QID) | ORAL | Status: DC | PRN

## 2022-10-31 MED ORDER — 0.9 % SODIUM CHLORIDE (POUR BTL) OPTIME
TOPICAL | Status: DC | PRN
  Administered 2022-10-31: 1000 mL

## 2022-10-31 MED ORDER — ACETAMINOPHEN 325 MG PO TABS
650.0000 mg | ORAL_TABLET | ORAL | Status: DC | PRN
  Administered 2022-10-31 – 2022-11-03 (×4): 650 mg via ORAL
  Filled 2022-10-31 (×4): qty 2

## 2022-10-31 MED ORDER — PHENYLEPHRINE 80 MCG/ML (10ML) SYRINGE FOR IV PUSH (FOR BLOOD PRESSURE SUPPORT)
PREFILLED_SYRINGE | INTRAVENOUS | Status: DC | PRN
  Administered 2022-10-31: 320 ug via INTRAVENOUS
  Administered 2022-10-31: 160 ug via INTRAVENOUS
  Administered 2022-10-31: 80 ug via INTRAVENOUS

## 2022-10-31 MED ORDER — ORAL CARE MOUTH RINSE: 15.0000 mL | Freq: Once | OROMUCOSAL | Status: AC

## 2022-10-31 MED ORDER — LIDOCAINE-EPINEPHRINE 1 %-1:100000 IJ SOLN
INTRAMUSCULAR | Status: DC | PRN
  Administered 2022-10-31: 10 mL

## 2022-10-31 MED ORDER — ROCURONIUM BROMIDE 10 MG/ML (PF) SYRINGE
PREFILLED_SYRINGE | INTRAVENOUS | Status: DC | PRN
  Administered 2022-10-31 (×2): 10 mg via INTRAVENOUS
  Administered 2022-10-31: 50 mg via INTRAVENOUS
  Administered 2022-10-31: 20 mg via INTRAVENOUS
  Administered 2022-10-31: 10 mg via INTRAVENOUS

## 2022-10-31 MED ORDER — CEFAZOLIN SODIUM-DEXTROSE 2-4 GM/100ML-% IV SOLN
2.0000 g | Freq: Three times a day (TID) | INTRAVENOUS | Status: AC
  Administered 2022-10-31 – 2022-11-01 (×2): 2 g via INTRAVENOUS
  Filled 2022-10-31 (×2): qty 100

## 2022-10-31 MED ORDER — MIDAZOLAM HCL 2 MG/2ML IJ SOLN
INTRAMUSCULAR | Status: AC
  Filled 2022-10-31: qty 2

## 2022-10-31 MED ORDER — DORZOLAMIDE HCL-TIMOLOL MAL 2-0.5 % OP SOLN
1.0000 [drp] | Freq: Two times a day (BID) | OPHTHALMIC | Status: DC
  Administered 2022-10-31 – 2022-11-04 (×8): 1 [drp] via OPHTHALMIC
  Filled 2022-10-31: qty 10

## 2022-10-31 MED ORDER — ACETAMINOPHEN 500 MG PO TABS
1000.0000 mg | ORAL_TABLET | Freq: Once | ORAL | Status: AC
  Administered 2022-10-31: 1000 mg via ORAL
  Filled 2022-10-31: qty 2

## 2022-10-31 MED ORDER — DEXAMETHASONE SODIUM PHOSPHATE 10 MG/ML IJ SOLN
INTRAMUSCULAR | Status: AC
  Filled 2022-10-31: qty 1

## 2022-10-31 MED ORDER — SODIUM CHLORIDE 0.9 % IV SOLN: INTRAVENOUS | Status: DC

## 2022-10-31 MED ORDER — METOPROLOL SUCCINATE ER 25 MG PO TB24
25.0000 mg | ORAL_TABLET | Freq: Every day | ORAL | Status: DC
  Administered 2022-11-01 – 2022-11-04 (×4): 25 mg via ORAL
  Filled 2022-10-31 (×4): qty 1

## 2022-10-31 MED ORDER — SODIUM CHLORIDE 0.9% FLUSH
3.0000 mL | Freq: Two times a day (BID) | INTRAVENOUS | Status: DC
  Administered 2022-10-31 – 2022-11-04 (×8): 3 mL via INTRAVENOUS

## 2022-10-31 MED ORDER — PREDNISOLONE ACETATE 1 % OP SUSP
1.0000 [drp] | Freq: Two times a day (BID) | OPHTHALMIC | Status: DC
  Administered 2022-10-31 – 2022-11-04 (×8): 1 [drp] via OPHTHALMIC
  Filled 2022-10-31: qty 5

## 2022-10-31 MED ORDER — BRIMONIDINE TARTRATE 0.2 % OP SOLN
1.0000 [drp] | Freq: Three times a day (TID) | OPHTHALMIC | Status: DC
  Administered 2022-10-31 – 2022-11-04 (×12): 1 [drp] via OPHTHALMIC
  Filled 2022-10-31: qty 5

## 2022-10-31 MED ORDER — PANTOPRAZOLE SODIUM 40 MG IV SOLR
40.0000 mg | Freq: Every day | INTRAVENOUS | Status: DC
  Administered 2022-10-31 – 2022-11-01 (×2): 40 mg via INTRAVENOUS
  Filled 2022-10-31 (×2): qty 10

## 2022-10-31 MED ORDER — PROPOFOL 10 MG/ML IV BOLUS
INTRAVENOUS | Status: AC
  Filled 2022-10-31: qty 20

## 2022-10-31 MED ORDER — AMLODIPINE BESYLATE 5 MG PO TABS
5.0000 mg | ORAL_TABLET | Freq: Every day | ORAL | Status: DC
  Administered 2022-11-01 – 2022-11-04 (×4): 5 mg via ORAL
  Filled 2022-10-31 (×4): qty 1

## 2022-10-31 MED ORDER — FENTANYL CITRATE (PF) 100 MCG/2ML IJ SOLN
25.0000 ug | INTRAMUSCULAR | Status: DC | PRN
  Administered 2022-10-31: 25 ug via INTRAVENOUS

## 2022-10-31 MED ORDER — ONDANSETRON HCL 4 MG/2ML IJ SOLN
INTRAMUSCULAR | Status: DC | PRN
  Administered 2022-10-31: 4 mg via INTRAVENOUS

## 2022-10-31 MED ORDER — ONDANSETRON HCL 4 MG/2ML IJ SOLN
INTRAMUSCULAR | Status: AC
  Filled 2022-10-31: qty 2

## 2022-10-31 MED ORDER — PHENYLEPHRINE HCL-NACL 20-0.9 MG/250ML-% IV SOLN
INTRAVENOUS | Status: DC | PRN
  Administered 2022-10-31: 40 ug/min via INTRAVENOUS

## 2022-10-31 MED ORDER — BUPIVACAINE HCL (PF) 0.5 % IJ SOLN
INTRAMUSCULAR | Status: AC
  Filled 2022-10-31: qty 30

## 2022-10-31 MED ORDER — FENTANYL CITRATE (PF) 250 MCG/5ML IJ SOLN
INTRAMUSCULAR | Status: AC
  Filled 2022-10-31: qty 5

## 2022-10-31 MED ORDER — OXYCODONE HCL 5 MG PO TABS
5.0000 mg | ORAL_TABLET | ORAL | Status: DC | PRN
  Administered 2022-10-31 – 2022-11-03 (×5): 5 mg via ORAL
  Filled 2022-10-31 (×4): qty 1

## 2022-10-31 MED ORDER — INSULIN ASPART 100 UNIT/ML IJ SOLN: 0.0000 [IU] | Freq: Three times a day (TID) | INTRAMUSCULAR | Status: DC

## 2022-10-31 SURGICAL SUPPLY — 83 items
ADH SKN CLS APL DERMABOND .7 (GAUZE/BANDAGES/DRESSINGS) ×2
BAG COUNTER SPONGE SURGICOUNT (BAG) ×1 IMPLANT
BAG SPNG CNTER NS LX DISP (BAG) ×1
BASKET BONE COLLECTION (BASKET) IMPLANT
BLADE BONE MILL MEDIUM (MISCELLANEOUS) IMPLANT
BLADE CLIPPER SURG (BLADE) IMPLANT
BUR 14 MATCH 3 (BUR) IMPLANT
BUR MR8 14CM BALL SYMTRI 5 (BUR) IMPLANT
BURR 14 MATCH 3 (BUR) ×1
BURR MR8 14CM BALL SYMTRI 5 (BUR) ×1
CAP PUSHER PTP (ORTHOPEDIC DISPOSABLE SUPPLIES) IMPLANT
CATH ROBINSON RED A/P 12FR (CATHETERS) IMPLANT
CLIP SPRING STIM LLIF SAFEOP (CLIP) IMPLANT
COVERAGE SUPPORT O-ARM STEALTH (MISCELLANEOUS) ×1 IMPLANT
DERMABOND ADVANCED .7 DNX12 (GAUZE/BANDAGES/DRESSINGS) ×2 IMPLANT
DILATOR INSULATED LLIF 8-13-18 (NEUROSURGERY SUPPLIES) IMPLANT
DISSECTOR BLUNT TIP ENDO 5MM (MISCELLANEOUS) ×1 IMPLANT
DRAPE C-ARM 42X72 X-RAY (DRAPES) ×1 IMPLANT
DRAPE C-ARMOR (DRAPES) ×1 IMPLANT
DRAPE LAPAROTOMY 100X72X124 (DRAPES) ×1 IMPLANT
DRAPE ORTHO SPLIT 77X108 STRL (DRAPES) ×1
DRAPE SHEET LG 3/4 BI-LAMINATE (DRAPES) ×4 IMPLANT
DRAPE SURG ORHT 6 SPLT 77X108 (DRAPES) IMPLANT
DRSG OPSITE POSTOP 4X6 (GAUZE/BANDAGES/DRESSINGS) IMPLANT
DURAPREP 26ML APPLICATOR (WOUND CARE) ×1 IMPLANT
ELECT BLADE 4.0 EZ CLEAN MEGAD (MISCELLANEOUS) ×1
ELECT KIT SAFEOP SSEP/SURF (KITS) ×1
ELECT REM PT RETURN 9FT ADLT (ELECTROSURGICAL) ×1
ELECTRODE BLDE 4.0 EZ CLN MEGD (MISCELLANEOUS) IMPLANT
ELECTRODE KT SAFEOP SSEP/SURF (KITS) IMPLANT
ELECTRODE REM PT RTRN 9FT ADLT (ELECTROSURGICAL) ×1 IMPLANT
FEE COVERAGE SUPPORT O-ARM (MISCELLANEOUS) ×1 IMPLANT
GAUZE 4X4 16PLY ~~LOC~~+RFID DBL (SPONGE) IMPLANT
GLOVE BIO SURGEON STRL SZ 6.5 (GLOVE) IMPLANT
GLOVE BIO SURGEON STRL SZ7 (GLOVE) IMPLANT
GLOVE BIOGEL PI IND STRL 7.0 (GLOVE) IMPLANT
GLOVE BIOGEL PI IND STRL 7.5 (GLOVE) ×1 IMPLANT
GLOVE ECLIPSE 7.0 STRL STRAW (GLOVE) ×1 IMPLANT
GLOVE EXAM NITRILE XL STR (GLOVE) IMPLANT
GOWN STRL REUS W/ TWL LRG LVL3 (GOWN DISPOSABLE) IMPLANT
GOWN STRL REUS W/ TWL XL LVL3 (GOWN DISPOSABLE) ×2 IMPLANT
GOWN STRL REUS W/TWL 2XL LVL3 (GOWN DISPOSABLE) IMPLANT
GOWN STRL REUS W/TWL LRG LVL3 (GOWN DISPOSABLE)
GOWN STRL REUS W/TWL XL LVL3 (GOWN DISPOSABLE) ×2
GRAFT BONE PROTEIOS SM 1CC (Orthopedic Implant) IMPLANT
GUIDEWIRE LLIF TT 310 (WIRE) IMPLANT
HEMOSTAT POWDER KIT SURGIFOAM (HEMOSTASIS) IMPLANT
KIT BASIN OR (CUSTOM PROCEDURE TRAY) ×1 IMPLANT
KIT TURNOVER KIT B (KITS) ×1 IMPLANT
KNIFE ANNULOTOMY (BLADE) IMPLANT
LIF ILLUMINATION SYSTEM STERIL (SYSTAGENIX WOUND MANAGEMENT) ×1
MARKER SPHERE PSV REFLC NDI (MISCELLANEOUS) ×5 IMPLANT
MODULE POSITIONING LIF 2.0 (INSTRUMENTS) IMPLANT
NEEDLE HYPO 22GX1.5 SAFETY (NEEDLE) ×1 IMPLANT
NS IRRIG 1000ML POUR BTL (IV SOLUTION) ×1 IMPLANT
PACK LAMINECTOMY NEURO (CUSTOM PROCEDURE TRAY) ×1 IMPLANT
PATTIES SURGICAL 1X1 (DISPOSABLE) IMPLANT
PIN BONE FIX 100 (PIN) IMPLANT
PROBE BALL TIP LLIF SAFEOP (NEUROSURGERY SUPPLIES) IMPLANT
ROD LORD LIPPED TI 5.5X60 (Rod) IMPLANT
ROD LORD LIPPED TI 5.5X65 (Rod) IMPLANT
SCREW CANC PA 6.5X35 (Screw) IMPLANT
SET SCREW (Screw) ×6 IMPLANT
SET SCREW SPNE (Screw) IMPLANT
SHIM INTRADISCAL PTP NARROW (ORTHOPEDIC DISPOSABLE SUPPLIES) IMPLANT
SOL ELECTROSURG ANTI STICK (MISCELLANEOUS) ×2
SOLUTION ELECTROSURG ANTI STCK (MISCELLANEOUS) ×2 IMPLANT
SPACER LTX 6X18X45 15D (Spacer) IMPLANT
SPIKE FLUID TRANSFER (MISCELLANEOUS) ×1 IMPLANT
SPONGE SURGIFOAM ABS GEL SZ50 (HEMOSTASIS) IMPLANT
SPONGE T-LAP 4X18 ~~LOC~~+RFID (SPONGE) IMPLANT
SPONGE TONSIL 1 RF SGL (DISPOSABLE) IMPLANT
STAPLER VISISTAT 35W (STAPLE) ×1 IMPLANT
SUT VIC AB 0 CT1 18XCR BRD8 (SUTURE) ×1 IMPLANT
SUT VIC AB 0 CT1 8-18 (SUTURE) ×4
SUT VIC AB 2-0 CT1 18 (SUTURE) ×1 IMPLANT
SUT VIC AB 3-0 SH 8-18 (SUTURE) ×1 IMPLANT
SYR 3ML LL SCALE MARK (SYRINGE) IMPLANT
SYSTEM ILLUMINATION LIF STERIL (SYSTAGENIX WOUND MANAGEMENT) IMPLANT
TOWEL GREEN STERILE (TOWEL DISPOSABLE) ×1 IMPLANT
TOWEL GREEN STERILE FF (TOWEL DISPOSABLE) ×1 IMPLANT
TRAY FOLEY MTR SLVR 16FR STAT (SET/KITS/TRAYS/PACK) ×1 IMPLANT
WATER STERILE IRR 1000ML POUR (IV SOLUTION) ×1 IMPLANT

## 2022-10-31 NOTE — Transfer of Care (Signed)
Immediate Anesthesia Transfer of Care Note  Patient: Brian Daniels  Procedure(s) Performed: PRONE TRANS-PSOAS LATERAL INTERBODY FUSION, LUMBAR TWO-THREE, LUMBAR THREE-FOUR, LUMBAR TWO-FOUR LAMINECTOMY WITH INSTRUMENTATION Application of O-Arm  Patient Location: PACU  Anesthesia Type:General  Level of Consciousness: drowsy  Airway & Oxygen Therapy: Patient Spontanous Breathing and Patient connected to face mask oxygen  Post-op Assessment: Report given to RN and Post -op Vital signs reviewed and stable  Post vital signs: Reviewed and stable  Last Vitals:  Vitals Value Taken Time  BP 106/80 10/31/22 1432  Temp 37.2 C 10/31/22 1418  Pulse 74 10/31/22 1436  Resp 19 10/31/22 1436  SpO2 95 % 10/31/22 1436  Vitals shown include unvalidated device data.  Last Pain:  Vitals:   10/31/22 1418  TempSrc:   PainSc: Asleep         Complications: No notable events documented.

## 2022-10-31 NOTE — Progress Notes (Signed)
Orthopedic Tech Progress Note Patient Details:  Brian Daniels [birth year] [id number]  Ortho Devices Type of Ortho Device: Lumbar corsett Ortho Device/Splint Interventions: Ordered   Post Interventions Instructions Provided: Care of device, Adjustment of device  [place] A Gerilyn Pilgrim 10/31/2022, 7:53 PM

## 2022-10-31 NOTE — Anesthesia Postprocedure Evaluation (Signed)
Anesthesia Post Note  Patient: Brian Daniels  Procedure(s) Performed: PRONE TRANS-PSOAS LATERAL INTERBODY FUSION, LUMBAR TWO-THREE, LUMBAR THREE-FOUR, LUMBAR TWO-FOUR LAMINECTOMY WITH INSTRUMENTATION Application of O-Arm     Patient location during evaluation: PACU Anesthesia Type: General Level of consciousness: awake and alert Pain management: pain level controlled Vital Signs Assessment: post-procedure vital signs reviewed and stable Respiratory status: spontaneous breathing, nonlabored ventilation, respiratory function stable and patient connected to nasal cannula oxygen Cardiovascular status: blood pressure returned to baseline and stable Postop Assessment: no apparent nausea or vomiting Anesthetic complications: no   No notable events documented.  Last Vitals:  Vitals:   10/31/22 1600 10/31/22 1615  BP: (!) 132/93 134/76  Pulse: 60 (!) 58  Resp: 16 12  Temp:  (!) 36.3 C  SpO2: 95% 98%    Last Pain:  Vitals:   10/31/22 1615  TempSrc:   PainSc: 0-No pain    LLE Motor Response: Purposeful movement;Responds to commands (10/31/22 1615)   RLE Motor Response: Purposeful movement;Responds to commands (10/31/22 1615)        Beryle Lathe

## 2022-10-31 NOTE — Anesthesia Procedure Notes (Signed)
Procedure Name: Intubation Date/Time: 10/31/2022 8:20 AM  Performed by: Jodell Cipro, CRNAPre-anesthesia Checklist: Patient identified, Emergency Drugs available, Suction available and Patient being monitored Patient Re-evaluated:Patient Re-evaluated prior to induction Oxygen Delivery Method: Circle System Utilized Preoxygenation: Pre-oxygenation with 100% oxygen Induction Type: IV induction Ventilation: Mask ventilation without difficulty Laryngoscope Size: Mac and 4 Grade View: Grade II Tube type: Oral Tube size: 7.5 mm Number of attempts: 1 Airway Equipment and Method: Stylet and Oral airway Placement Confirmation: ETT inserted through vocal cords under direct vision, positive ETCO2 and breath sounds checked- equal and bilateral Secured at: 23 cm Tube secured with: Tape Dental Injury: Teeth and Oropharynx as per pre-operative assessment

## 2022-10-31 NOTE — H&P (Signed)
Chief Complaint   No chief complaint on file.   History of Present Illness  Brian Daniels is a 67 year old male who I am seeing in follow-up. He has a known history of severe multifactorial stenosis at L2-3, L3-4 with associated chronic bilateral leg pain, paresthesias, and subjective heaviness with difficulty walking. Patient has been managed up until now with intermittent epidural steroid injections. Unfortunately, these injections are no longer effective. He notes that his lower extremity symptoms are worsening and is having trouble walking. He feels that his legs are going to give out, especially on the left side.   Past Medical History   Past Medical History:  Diagnosis Date   Cataracts, bilateral    removed by surgery   Diabetes mellitus without complication    type 2   Glaucoma    bilateral   Gout    Heart murmur    several years ago   Hypercholesteremia    Hypertension    Left atrial enlargement    Legally blind    bilateral   Neuropathy    hands, arm   Numbness    hands, arms   Wears partial dentures    lower   Wheelchair bound    since 05/30/19    Past Surgical History   Past Surgical History:  Procedure Laterality Date   ANTERIOR CERVICAL DECOMPRESSION/DISCECTOMY FUSION 4 LEVELS N/A 08/05/2019   Procedure: ANTERIOR CERVICAL DECOMPRESSION/DISCECTOMY FUSION CERVICAL THREE- CERVICAL FOUR, CERVICAL FOUR- CERVICAL FIVE, CERVICAL FIVE- CERVICAL SIX, CERVICAL SIX- CERVICAL SEVEN;  Surgeon: Lisbeth Renshaw, MD;  Location: [facility];  Service: Neurosurgery;  Laterality: N/A;  anterior   COLONOSCOPY     EYE SURGERY Bilateral    multiple surgeries bilateral -    MULTIPLE TOOTH EXTRACTIONS     WISDOM TOOTH EXTRACTION      Social History   Social History   Tobacco Use   Smoking status: Never   Smokeless tobacco: Never  Vaping Use   Vaping Use: Never used  Substance Use Topics   Alcohol use: Not Currently   Drug use: Never    Medications   Prior to  Admission medications   Medication Sig Start Date End Date Taking? Authorizing Provider  amLODipine (NORVASC) 5 MG tablet Take 5 mg by mouth daily. 11/05/20  Yes [provider]  Ascorbic Acid (VITAMIN C) 1000 MG tablet Take 1,000 mg by mouth daily.   Yes [provider]  aspirin EC 81 MG tablet Take 1 tablet (81 mg total) by mouth daily. 08/12/19  Yes Costella, Darci Current, PA-C  atorvastatin (LIPITOR) 20 MG tablet Take 1 tablet (20 mg total) by mouth daily. 08/25/19  Yes Angiulli, Mcarthur Rossetti, PA-C  benazepril (LOTENSIN) 10 MG tablet Take 1 tablet (10 mg total) by mouth daily. 08/25/19  Yes Angiulli, Mcarthur Rossetti, PA-C  BL CALCIUM-MAGNESIUM-ZINC PO Take 1 tablet by mouth daily.   Yes [provider]  brimonidine (ALPHAGAN) 0.2 % ophthalmic solution Place 1 drop into both eyes 3 (three) times daily.   Yes [provider]  Cyanocobalamin (B-12) 5000 MCG CAPS Take 5,000 mcg by mouth daily.   Yes [provider]  dorzolamide-timolol (COSOPT) 2-0.5 % ophthalmic solution Place 1 drop into the left eye 2 (two) times daily.   Yes [provider]  GINSENG PO Take 2 tablets by mouth daily as needed (energy boost).   Yes [provider]  lidocaine (LIDODERM) 5 % Place 2 patches onto the skin daily. Remove & Discard patch within 12  hours or as directed by MD Patient taking differently: Place 2 patches onto the skin daily as needed (pain). Remove & Discard patch within 12 hours or as directed by MD 08/29/19  Yes Ranelle Oyster, MD  meloxicam (MOBIC) 15 MG tablet Take 15 mg by mouth daily.   Yes [provider]  metFORMIN (GLUCOPHAGE-XR) 500 MG 24 hr tablet Take 1 tablet (500 mg total) by mouth at bedtime. 08/25/19  Yes Angiulli, Mcarthur Rossetti, PA-C  metoprolol succinate (TOPROL-XL) 25 MG 24 hr tablet Take 25 mg by mouth daily. 10/25/21  Yes [provider]  Omega-3 Fatty Acids (FISH OIL) 1000 MG CAPS Take 1,000 mg by mouth at bedtime.   Yes  [provider]  OVER THE COUNTER MEDICATION Take 1 capsule by mouth daily. Super Beta Prostate   Yes [provider]  prednisoLONE acetate (PRED FORTE) 1 % ophthalmic suspension Place 1 drop into the right eye 2 (two) times daily.   Yes [provider]  sodium chloride (MURO 128) 5 % ophthalmic solution Place 1 drop into both eyes as needed for eye irritation.   Yes [provider]  timolol (TIMOPTIC) 0.5 % ophthalmic solution Place 1 drop into both eyes 2 (two) times daily. 10/01/21  Yes [provider]  vitamin E 180 MG (400 UNITS) capsule Take 180 mg by mouth daily.    Yes [provider]  acetaminophen (TYLENOL) 325 MG tablet Take 2 tablets (650 mg total) by mouth every 4 (four) hours as needed for mild pain ((score 1 to 3) or temp > 100.5). 08/25/19   Angiulli, Mcarthur Rossetti, PA-C  baclofen (LIORESAL) 10 MG tablet Take 0.5 tablets (5 mg total) by mouth 3 (three) times daily. Patient taking differently: Take 5 mg by mouth daily as needed for muscle spasms. 02/03/20   Lovorn, Aundra Millet, MD  bisacodyl (DULCOLAX) 10 MG suppository Place 1 suppository (10 mg total) rectally daily as needed for moderate constipation. 08/25/19   Angiulli, Mcarthur Rossetti, PA-C  diclofenac Sodium (VOLTAREN) 1 % GEL Apply 2 g topically 4 (four) times daily as needed (pain). Patient not taking: Reported on 10/17/2022 08/25/19   Angiulli, Mcarthur Rossetti, PA-C  DULoxetine (CYMBALTA) 60 MG capsule Take 1 capsule (60 mg total) by mouth at bedtime. Patient not taking: Reported on 10/17/2022 08/25/19   Angiulli, Mcarthur Rossetti, PA-C  enoxaparin (LOVENOX) 40 MG/0.4ML injection lovenox 40 mg daily until 10/08/2019 and stop Patient not taking: Reported on 10/17/2022 08/25/19   Angiulli, Mcarthur Rossetti, PA-C  enoxaparin (LOVENOX) 40 MG/0.4ML injection Inject 0.4 mLs (40 mg total) into the skin daily. Quantity 30 syringes Patient not taking: Reported on 10/17/2022 09/09/19   Genice Rouge, MD  HYDROcodone-acetaminophen  (NORCO/VICODIN) 5-325 MG tablet Take 1 tablet by mouth every 4 (four) hours as needed for moderate pain ((score 4 to 6)). Patient not taking: Reported on 10/17/2022 08/25/19   Angiulli, Mcarthur Rossetti, PA-C  methocarbamol (ROBAXIN) 500 MG tablet Take 1 tablet (500 mg total) by mouth every 6 (six) hours as needed for muscle spasms (L shoulder pain/muscle tightness). Patient not taking: Reported on 10/17/2022 09/19/19   Genice Rouge, MD  senna (SENOKOT) 8.6 MG TABS tablet Take 2 tablets (17.2 mg total) by mouth 2 (two) times daily. Patient not taking: Reported on 10/17/2022 08/25/19   Angiulli, Mcarthur Rossetti, PA-C  tamsulosin (FLOMAX) 0.4 MG CAPS capsule TAKE 1 CAPSULE(0.4 MG) BY MOUTH DAILY AFTER SUPPER Patient not taking: Reported on 10/17/2022 07/22/22   Genice Rouge, MD  Allergies  No Known Allergies  Review of Systems  ROS  Neurologic Exam  Awake, alert, oriented Memory and concentration grossly intact Speech fluent, appropriate CN grossly intact Motor exam: Upper Extremities Deltoid Bicep Tricep Grip  Right 5/5 5/5 5/5 5/5  Left 5/5 5/5 5/5 5/5   Lower Extremities IP Quad PF DF EHL  Right 5/5 5/5 5/5 5/5 5/5  Left 5/5 5/5 5/5 5/5 5/5   Sensation grossly intact to LT  Imaging  MRI reveals severe stenosis of multifactorial origin at L3-4 and moderate-severe at L2-3.  Impression  - 67 y.o. male with progressive back and leg pain/difficulty walking related to multifactorial stenosis at L2-3, L3-4. He has not had improvement with conservative treatment.  Plan  - Proceed with trans-psoas interbody fusion L2-3, L3-4, direct posterior decompression/stabilization L2-L4  I have reviewed the indications for the procedure as well as the details of the procedure and the expected postoperative course and recovery at length with the patient and his sister caregiver in the office. We have also reviewed in detail the risks, benefits, and alternatives to the procedure. All questions were answered and Brian Daniels provided informed consent to proceed.  Lisbeth Renshaw, MD [facility]

## 2022-10-31 NOTE — Progress Notes (Signed)
Pt currently having significant weakness in Left leg, can barely move leg to side of bed. This RN not comfortable with POD 0 ambulation. PT/OT are due to eval tomorrow.

## 2022-10-31 NOTE — Op Note (Signed)
NEUROSURGERY OPERATIVE NOTE   PREOP DIAGNOSIS:  1. Lumbar spondylosis, L2-3, L3-4 2. Degenerative disc disease, L2-3, L3-4 3. Lumbar spinal stenosis with neurogenic claudication  POSTOP DIAGNOSIS: Same  PROCEDURE: 1. L2, L3 laminectomy with radical bilateral facetectomy for decompression of thecal sac and exiting nerve roots, more than would be required for placement of interbody graft 2. Posterior segmental instrumentation using cortical pedicle screws at L2 - L4 - Alphatec 4. Left Trans-psoas approach for anterior interbody arthrodesis, L2-3, L3-4 5. Use of locally harvested bone autograft 6. Use of non-structural bone allograft - DBM, ProteiOs 7. Use of intraoperative navigation  SURGEON: Dr. Lisbeth Renshaw, MD  ASSISTANT: Dr. Julio Sicks, MD  ANESTHESIA: General Endotracheal  EBL: 200cc  SPECIMENS: None  DRAINS: None  COMPLICATIONS: None immediate  CONDITION: Hemodynamically stable to PACU  HISTORY: Brian Daniels is a 67 y.o. male who has been followed in the outpatient clinic with back and leg pain related to spondylosis with significant stenosis at L2-3 and L3-4. Multiple conservative treatments were attempted without significant improvement and we ultimately elected to proceed with surgical decompression and fusion. Risks, benefits, and alternative treatments were reviewed in detail with the patient and his sister in the office. After all questions were answered, informed consent was obtained and witnessed.  PROCEDURE IN DETAIL: The patient was brought to the operating room via stretcher. After induction of general anesthesia, the patient was positioned on the operative table in the prone position. All pressure points were meticulously padded. Incision was then marked out and prepped and draped in the usual sterile fashion.  After timeout was conducted, skin was infiltrated with local anesthetic. Midline skin incision was then made sharply and Bovie electrocautery  was used to dissect the subcutaneous tissue until the lumbodorsal fascia was identified and incised. The muscle was then elevated in the subperiosteal plane and the L2 through L4 lamina and intervening facet complexes were identified. Self-retaining retractors were then placed. Lateral fluoroscopy was taken with a dissector in the L3-4 interspace to confirm our location.  At this point attention was turned to decompression. Complete L2 and L3 laminectomy was completed with a high-speed drill and Kerrison punches.  Normal dura was identified.  A ball-tipped dissector was then used to identify the foramina bilaterally.  High-speed drill was used to cut across the pars interarticularis and the inferior articulating process of L2 and L3 bilaterally was removed bilaterally.  The exiting nerve roots and the traversing nerve roots were then identified.  Kerrison punches were then used to complete the decompression by removal of the remaining lateral aspect of the lamina as well as the medial and superior aspect of the superior articulating processes of L3 and L4.  This allowed excellent decompression both centrally and in the lateral recess and foramina.  I was then able to easily pass a ball dissector in the ventral epidural space and underneath the nerve roots.  Hemostasis was then secured primarily with bipolar electrocautery and morselized Gelfoam and thrombin.  At this point a small stab incision was made over the right posterior superior iliac spine.  A Schanz pin was introduced and connected to the reference array.  Intraoperative CT scan was then taken.  Excellent accuracy was noted.  Navigation was then used to identify entry points for bilateral L2, L3, and L4 cortical pedicle screws.  The screws were then drilled, tapped, and placed utilizing intraoperative navigation.  Having placed the pedicle screws, attention was then turned to the [facility] psoas approach.  Incision  was made overlying the L3 vertebral body  in an oblique fashion after infiltration with anesthetic.  Subcutaneous tissue was then dissected with the Bovie and the oblique musculature was identified and divided.  The transversalis fascia was then identified and penetrated.  Blunt finger dissection was then used in the retroperitoneal space initially to identify the transverse process of L3.  I was then able to place a small dilator through the psoas muscle overlying the L3-4 disc space.  Utilizing sequential dilators which were stimulated, a retractor was placed over the L3-4 disc space.  I did not note any stimulation of the lumbar plexus less than 20 mA.  After placement of the retractor, the disc space was incised sharply.  Multiple curettes and rongeur's were initially used to complete the discectomy.  A Cobb periosteal was used to scrape the endplates of L3 and L4.  Box cutter was then used.  The remainder of the discectomy was completed.  Trials were then used to select the appropriately sized expandable interbody cage.  Endplates were prepared with curettes.  The expandable cage was filled with morselized bone autograft mixed with DBM and proteiOs and tapped into place under fluoroscopic guidance.  The cage was then expanded to achieve excellent endplate apposition and preserve lumbar lordosis.  Cage was then backfilled with more autograft.  The retractor was then closed down and removed under direct vision without any active bleeding identified.  In a similar fashion, the L2-3 disc space was identified and a small dilator was placed through the psoas muscle onto the disc space.  Utilizing fluoroscopic guidance and electrophysiologic monitoring, sequential dilators was used to place a retractor over the L2-3 disc space.  Again, I did not note any stimulation of the lumbar plexus less than 20 mA.  After placement of the retractor, the disc base was again incised.  Shavers, curettes, and periosteal's were used to complete the discectomy.  Trials were  then used to select the appropriately sized expandable cage.  Endplates were prepared with ring curettes.  Cage was packed with bone autograft, allograft, ProteiOs.  The cage was then inserted under fluoroscopic guidance and expanded maximally to achieve good endplate apposition and preserved lumbar lordosis.  The retractor was then closed down and removed under direct vision, again with no active bleeding identified.  Attention was then turned back to the midline incision where self-retaining retractors were placed.  A 65 mm pre-bent lordotic rod was placed over the right side with setscrews then placed and final tightened.  Similarly, a 55 mm pre-bent lordotic rod was placed in the left side with setscrews which were also final tightened.  Final AP and fluoroscopic images were taken and noted good hardware position and good lumbar alignment.  Hemostasis was secured and confirmed with bipolar cautery and morcellized gelfoam with thrombin. The wound was then irrigated with copious amounts of antibiotic saline, then closed in standard fashion using a combination of interrupted 0 and 3-0 Vicryl stitches in the muscular, fascial, and subcutaneous layers.  The left flank incision was also closed with interrupted 0 Vicryl stitches.  Both incisions were then closed with staples.  Bacitracin ointment and sterile dressings were applied.  The Schanz pin was removed and the stab incision closed with staples. The patient was then transferred to the stretcher, extubated, and taken to the postanesthesia care unit in stable hemodynamic condition.  At the end of the case all sponge, needle, cottonoid, and instrument counts were correct.   Lisbeth Renshaw, MD [facility]

## 2022-11-01 LAB — GLUCOSE, CAPILLARY
Glucose-Capillary: 105 mg/dL — ABNORMAL HIGH (ref 70–99)
Glucose-Capillary: 118 mg/dL — ABNORMAL HIGH (ref 70–99)
Glucose-Capillary: 126 mg/dL — ABNORMAL HIGH (ref 70–99)
Glucose-Capillary: 114 mg/dL — ABNORMAL HIGH (ref 70–99)
Glucose-Capillary: 157 mg/dL — ABNORMAL HIGH (ref 70–99)

## 2022-11-01 NOTE — Evaluation (Signed)
Physical Therapy Evaluation Patient Details Name: Brian Daniels MRN: [id number] DOB: 1955-10-31 Today's Date: 11/01/2022  History of Present Illness  67 y.o. male admitted 4/19 s/p prone trans-psoas lateral interbody fusion L2-3, L3-4, with L2-4 laminectomy. PMH: Glaucoma (legally blind,) HTN, DM, gout.  Clinical Impression  Patient is s/p above surgery resulting in functional limitations due to the deficits listed below (see PT Problem List). Educated on back precautions, brace use, log roll technique (Min A,) sit to stand transfers (mod assist,) RW use and step pivot to chair (mod assist.) Difficulty with left hip flexion post-op during transfer to chair. Knees flexed while standing, mild buckling present when WB through LLE. Posterior loss of balance in sitting and standing. Mod I prior to surgery but with some difficulty ambulating, no AD use in home, but uses RW and rollator interchangeably when in community. Lives with his sister at home. Hx of blindness from glaucoma. Likely benefit most from post-acute rehab at d/c due to high fall risk. Patient will benefit from acute skilled PT to increase their independence and safety with mobility to facilitate discharge.     Patient will benefit from intensive inpatient follow up therapy, >3 hours/day    Recommendations for follow up therapy are one component of a multi-disciplinary discharge planning process, led by the attending physician.  Recommendations may be updated based on patient status, additional functional criteria and insurance authorization.  Follow Up Recommendations Can patient physically be transported by private vehicle: No (But soon)     Assistance Recommended at Discharge Frequent or constant Supervision/Assistance  Patient can return home with the following  A lot of help with walking and/or transfers;A lot of help with bathing/dressing/bathroom;Assistance with cooking/housework;Assist for transportation;Help with stairs or  ramp for entrance    Equipment Recommendations None recommended by PT  Recommendations for Other Services  Rehab consult    Functional Status Assessment Patient has had a recent decline in their functional status and demonstrates the ability to make significant improvements in function in a reasonable and predictable amount of time.     Precautions / Restrictions Precautions Precautions: Back Precaution Booklet Issued:  (Verbally reviewed,) Required Braces or Orthoses: Spinal Brace Spinal Brace: Lumbar corset;Applied in sitting position Restrictions Weight Bearing Restrictions: No      Mobility  Bed Mobility Overal bed mobility: Needs Assistance Bed Mobility: Rolling, Sidelying to Sit Rolling: Min assist Sidelying to sit: Min assist       General bed mobility comments: Educated on log roll towards left, similar to home set-up. Cues for technique, sequencing, min assist to roll and for trunk support to rise. leans posteriorly as he comes up.    Transfers Overall transfer level: Needs assistance Equipment used: Rolling walker (2 wheels) Transfers: Sit to/from Stand, Bed to chair/wheelchair/BSC Sit to Stand: Mod assist   Step pivot transfers: Mod assist       General transfer comment: Mod assist for boost to stand performed x2 from bed. Heavy posterior lean, improves with practice, cues and facilitation for forward weight shift. Mod assist stability and RW control to step pivot to recliner. Dragging Lt foot, but able to lift with max cues. LEs remain flexed during transition with minor corrections following heavy cues.    Ambulation/Gait               General Gait Details: Deferred due to LE buckling and reported dizziness with standing.  Stairs            Psychologist, prison and probation services  Modified Rankin (Stroke Patients Only)       Balance Overall balance assessment: Needs assistance Sitting-balance support: Bilateral upper extremity supported Sitting  balance-Leahy Scale: Poor Sitting balance - Comments: intermittent min assist for balance correction, falling backwards. Postural control: Posterior lean Standing balance support: During functional activity, Reliant on assistive device for balance, Bilateral upper extremity supported Standing balance-Leahy Scale: Poor                               Pertinent Vitals/Pain Pain Assessment Pain Assessment: 0-10 Pain Score: 6  Pain Location: back, Lt heel Pain Descriptors / Indicators: Aching, Operative site guarding Pain Intervention(s): Monitored during session, Repositioned, Limited activity within patient's tolerance, Premedicated before session    Home Living Family/patient expects to be discharged to:: Private residence Living Arrangements: Other relatives (sister) Available Help at Discharge: Family;Available 24 hours/day Type of Home: House Home Access: Stairs to enter   [employer] of Steps: 1 (stoop)   Home Layout: One level Home Equipment: Agricultural consultant (2 wheels);Rollator (4 wheels);Shower seat;Wheelchair - manual;Cane - single point      Prior Function Prior Level of Function : Independent/Modified Independent             Mobility Comments: independent, use of rollator  and RW depending on where he was travelling out of the house .No use in house. ADLs Comments: ind     Hand Dominance   Dominant Hand: Left    Extremity/Trunk Assessment   Upper Extremity Assessment Upper Extremity Assessment: Defer to OT evaluation    Lower Extremity Assessment Lower Extremity Assessment: Generalized weakness;LLE deficits/detail LLE Deficits / Details: Distal strength intact, hip flexion 4-/5 LLE Sensation: WNL       Communication   Communication: No difficulties (Glaucoma)  Cognition Arousal/Alertness: Awake/alert Behavior During Therapy: WFL for tasks assessed/performed Overall Cognitive Status: Within Functional Limits for tasks assessed                                           General Comments General comments (skin integrity, edema, etc.): Educated on precaution, back brace use    Exercises General Exercises - Lower Extremity Ankle Circles/Pumps: AROM, Both, 10 reps, Supine Quad Sets: Strengthening, Both, 10 reps, Seated   Assessment/Plan    PT Assessment Patient needs continued PT services  PT Problem List Decreased strength;Decreased range of motion;Decreased activity tolerance;Decreased balance;Decreased mobility;Decreased knowledge of use of DME;Decreased knowledge of precautions;Pain       PT Treatment Interventions DME instruction;Gait training;Functional mobility training;Therapeutic activities;Therapeutic exercise;Balance training;Neuromuscular re-education;Patient/family education;Modalities    PT Goals (Current goals can be found in the Care Plan section)  Acute Rehab PT Goals Patient Stated Goal: Get stronger PT Goal Formulation: With patient Time For Goal Achievement: 11/14/22 Potential to Achieve Goals: Good    Frequency Min 4X/week     Co-evaluation               AM-PAC PT "6 Clicks" Mobility  Outcome Measure Help needed turning from your back to your side while in a flat bed without using bedrails?: A Little Help needed moving from lying on your back to sitting on the side of a flat bed without using bedrails?: A Little Help needed moving to and from a bed to a chair (including a wheelchair)?: A Lot Help needed standing up from a  chair using your arms (e.g., wheelchair or bedside chair)?: A Lot Help needed to walk in hospital room?: A Lot Help needed climbing 3-5 steps with a railing? : Total 6 Click Score: 13    End of Session Equipment Utilized During Treatment: Gait belt;Back brace Activity Tolerance: Patient tolerated treatment well Patient left: in chair;with call bell/phone within reach;with chair alarm set Nurse Communication:  (Nurse tech - transfer  technique SPT) PT Visit Diagnosis: Unsteadiness on feet (R26.81);Muscle weakness (generalized) (M62.81);Difficulty in walking, not elsewhere classified (R26.2);Pain Pain - Right/Left:  (back) Pain - part of body:  (back)    Time: 1610-9604 PT Time Calculation (min) (ACUTE ONLY): 38 min   Charges:   PT Evaluation $PT Eval Moderate Complexity: 1 Mod PT Treatments $Therapeutic Activity: 23-37 mins        Kathlyn Sacramento, PT, DPT Physical Therapist Acute Rehabilitation Services [facility] Outpatient Rehabilitation Services [facility]   Berton Mount 11/01/2022, 11:24 AM

## 2022-11-01 NOTE — Evaluation (Signed)
Occupational Therapy Evaluation Patient Details Name: Brian Daniels MRN: [id number] DOB: 03/16/1956 Today's Date: 11/01/2022   History of Present Illness 67 y.o. male admitted 4/19 s/p prone trans-psoas lateral interbody fusion L2-3, L3-4, with L2-4 laminectomy. PMH: Glaucoma (legally blind,) HTN, DM, gout.   Clinical Impression   Pt admitted for procedure listed above. PTA pt reported that he was fairly independent with some intermittent weakness and high pain levels. At this time, pt continues to report surgical pain and weakness. He is requiring set up assist for all UB and basic ADL's due to vision and weakness, however requiring max assist for LB ADL's, along with cuing for spinal precautions. Evaluation was limited at this time as pt just received lunch and wanted to eat and sit up a while before returning to bed. Recommending further therapy/rehab at this time due to pt requiring increased assist and support. OT will continue to follow acutely.       Recommendations for follow up therapy are one component of a multi-disciplinary discharge planning process, led by the attending physician.  Recommendations may be updated based on patient status, additional functional criteria and insurance authorization.   Assistance Recommended at Discharge Frequent or constant Supervision/Assistance  Patient can return home with the following A lot of help with walking and/or transfers;A lot of help with bathing/dressing/bathroom;Assistance with cooking/housework;Help with stairs or ramp for entrance    Functional Status Assessment  Patient has had a recent decline in their functional status and demonstrates the ability to make significant improvements in function in a reasonable and predictable amount of time.  Equipment Recommendations  None recommended by OT    Recommendations for Other Services Rehab consult     Precautions / Restrictions Precautions Precautions: Back Precaution Booklet  Issued:  (Verbally reviewed,) Required Braces or Orthoses: Spinal Brace Spinal Brace: Lumbar corset;Applied in sitting position Restrictions Weight Bearing Restrictions: No      Mobility Bed Mobility               General bed mobility comments: Pt up in recliner on entry    Transfers                   General transfer comment: Deferred at this time as pt needed assist getting set up for lunch in chair      Balance Overall balance assessment: Needs assistance Sitting-balance support: Bilateral upper extremity supported Sitting balance-Leahy Scale: Poor Sitting balance - Comments: intermittent min assist for balance correction, falling backwards. Postural control: Posterior lean Standing balance support: During functional activity, Reliant on assistive device for balance, Bilateral upper extremity supported Standing balance-Leahy Scale: Poor                             ADL either performed or assessed with clinical judgement   ADL Overall ADL's : Needs assistance/impaired Eating/Feeding: Set up;Sitting   Grooming: Set up;Sitting   Upper Body Bathing: Set up;Sitting   Lower Body Bathing: Maximal assistance;Sitting/lateral leans;Sit to/from stand;Adhering to back precautions   Upper Body Dressing : Set up;Sitting   Lower Body Dressing: Maximal assistance;Sitting/lateral leans;Sit to/from stand;Adhering to back precautions   Toilet Transfer: Moderate assistance;Stand-pivot   Toileting- Clothing Manipulation and Hygiene: Maximal assistance;Sitting/lateral lean;Sit to/from stand       Functional mobility during ADLs: Moderate assistance;Rolling walker (2 wheels) General ADL Comments: Pt limited with mobility due to weakness and balance deficits, requiring increased assist for all LB ADL's due  to spinal precautions and weakness     Vision Baseline Vision/History: 3 Glaucoma Ability to See in Adequate Light: 3 Highly impaired Patient Visual  Report: No change from baseline Vision Assessment?: No apparent visual deficits Additional Comments: Legally blind in both eyes at baseline     Perception Perception Perception Tested?: No   Praxis Praxis Praxis tested?: Not tested    Pertinent Vitals/Pain Pain Assessment Pain Assessment: 0-10 Pain Score: 7  Pain Location: back, Lt heel Pain Descriptors / Indicators: Aching, Operative site guarding Pain Intervention(s): Limited activity within patient's tolerance, Monitored during session, Premedicated before session     Hand Dominance Left   Extremity/Trunk Assessment Upper Extremity Assessment Upper Extremity Assessment: Overall WFL for tasks assessed   Lower Extremity Assessment Lower Extremity Assessment: Generalized weakness   Cervical / Trunk Assessment Cervical / Trunk Assessment: Back Surgery   Communication Communication Communication: No difficulties (Glaucoma)   Cognition Arousal/Alertness: Awake/alert Behavior During Therapy: WFL for tasks assessed/performed Overall Cognitive Status: Within Functional Limits for tasks assessed                                       General Comments  VSS on RA    Exercises     Shoulder Instructions      Home Living Family/patient expects to be discharged to:: Private residence Living Arrangements: Other relatives (sister) Available Help at Discharge: Family;Available 24 hours/day Type of Home: House Home Access: Stairs to enter [employer] of Steps: 1 (stoop)   Home Layout: One level     Bathroom Shower/Tub: Chief Strategy Officer: Handicapped height Bathroom Accessibility:  (unsure)   Home Equipment: Agricultural consultant (2 wheels);Rollator (4 wheels);Shower seat;Wheelchair - manual;Cane - single point          Prior Functioning/Environment Prior Level of Function : Independent/Modified Independent             Mobility Comments: independent, use of rollator  and RW  depending on where he was travelling out of the house .No use in house. ADLs Comments: ind        OT Problem List: Decreased strength;Decreased activity tolerance;Impaired balance (sitting and/or standing);Impaired vision/perception;Impaired UE functional use;Pain      OT Treatment/Interventions: Therapeutic exercise;Self-care/ADL training;Energy conservation;DME and/or AE instruction;Therapeutic activities;Patient/family education;Balance training    OT Goals(Current goals can be found in the care plan section) Acute Rehab OT Goals Patient Stated Goal: To get stronger OT Goal Formulation: With patient Time For Goal Achievement: 11/15/22 Potential to Achieve Goals: Good ADL Goals Pt Will Perform Grooming: with min guard assist;standing Pt Will Perform Lower Body Bathing: with min assist;sitting/lateral leans;sit to/from stand Pt Will Perform Lower Body Dressing: with min assist;sitting/lateral leans;sit to/from stand Pt Will Transfer to Toilet: with min guard assist;ambulating Pt Will Perform Toileting - Clothing Manipulation and hygiene: with min guard assist;sitting/lateral leans;sit to/from stand;with adaptive equipment  OT Frequency: Min 2X/week    Co-evaluation              AM-PAC OT "6 Clicks" Daily Activity     Outcome Measure Help from another person eating meals?: A Little Help from another person taking care of personal grooming?: A Little Help from another person toileting, which includes using toliet, bedpan, or urinal?: A Lot Help from another person bathing (including washing, rinsing, drying)?: A Lot Help from another person to put on and taking off regular upper body clothing?:  A Little Help from another person to put on and taking off regular lower body clothing?: A Lot 6 Click Score: 15   End of Session Equipment Utilized During Treatment: Gait belt;Rolling walker (2 wheels) Nurse Communication: Mobility status  Activity Tolerance: Patient tolerated  treatment well Patient left: in bed;with call bell/phone within reach;with family/visitor present  OT Visit Diagnosis: Unsteadiness on feet (R26.81);Other abnormalities of gait and mobility (R26.89);Muscle weakness (generalized) (M62.81)                Time: 1610-9604 OT Time Calculation (min): 16 min Charges:  OT General Charges $OT Visit: 1 Visit OT Evaluation $OT Eval Moderate Complexity: 1 Mod  Deyonna Fitzsimmons Bing Plume, OTR/L [facility] Acute Rehab  Ambriana Selway Elane Bing Plume 11/01/2022, 2:15 PM

## 2022-11-01 NOTE — Progress Notes (Signed)
Inpatient Rehab Admissions Coordinator Note:   Per therapy patient was screened for CIR candidacy by Jacob Cicero Luvenia Starch, CCC-SLP. At this time, pt appears to be a potential candidate for CIR. I will place an order for rehab consult for full assessment, per our protocol.  Please contact me any with questions.Wolfgang Phoenix, MS, CCC-SLP Admissions Coordinator [phone] 11/01/22 6:11 PM

## 2022-11-01 NOTE — Progress Notes (Signed)
  NEUROSURGERY PROGRESS NOTE   No issues overnight. Pt with mild back soreness. Reports feeling weak.  EXAM:  BP (!) 169/84 (BP Location: Right Arm)   Pulse 81   Temp 98.1 F (36.7 C) (Oral)   Resp 20   Ht  (1.778 m)   Wt 76.2 kg   SpO2 99%   BMI 24.11 kg/m   Awake, alert, oriented  Speech fluent, appropriate  CN grossly intact  5/5 BUE/RLE Left PF/DF/Quad 5/5, 4-/5 IP Incisions c/d/i   IMPRESSION:  66 y.o. male POD#1 L2-4 posterior decompression/stabilization, L2-4 left trans-psoas interbody fusion. Neurologically at baseline, perhaps slightly worsened left hip flexion weakness likely from psoas injury.  PLAN: - Will get PT/OT eval today - Cont supportive care   Lisbeth Renshaw, MD [facility]

## 2022-11-01 NOTE — Progress Notes (Signed)
Occupational Therapy Treatment Patient Details Name: Brian Daniels MRN: [id number] DOB: [birth year] Today's Date: 11/01/2022   History of present illness 67 y.o. male admitted 4/19 s/p prone trans-psoas lateral interbody fusion L2-3, L3-4, with L2-4 laminectomy. PMH: Glaucoma (legally blind,) HTN, DM, gout.   OT comments  Pt seen for a 2nd OT session in order to further evaluate his mobility and provide extensive education to him and his sister on spinal precautions and compensatory strategies for ADL's. He required heavy mod A to stand and pivot to the bed from the recliner and continued to present with a posterior lean the he was unable to correct with maximal verbal cuing. Pt continues to have weakness on his left leg limiting his ability to take larger steps. Continuing to recommend extensive rehab and acute OT will continue to follow.    Recommendations for follow up therapy are one component of a multi-disciplinary discharge planning process, led by the attending physician.  Recommendations may be updated based on patient status, additional functional criteria and insurance authorization.    Assistance Recommended at Discharge Frequent or constant Supervision/Assistance  Patient can return home with the following  A lot of help with walking and/or transfers;A lot of help with bathing/dressing/bathroom;Assistance with cooking/housework;Help with stairs or ramp for entrance   Equipment Recommendations  None recommended by OT    Recommendations for Other Services Rehab consult    Precautions / Restrictions Precautions Precautions: Back Precaution Booklet Issued: No Required Braces or Orthoses: Spinal Brace Spinal Brace: Lumbar corset;Applied in sitting position Restrictions Weight Bearing Restrictions: No       Mobility Bed Mobility Overal bed mobility: Needs Assistance Bed Mobility: Rolling, Sit to Sidelying Rolling: Modified independent (Device/Increase time)       Sit  to sidelying: Min assist General bed mobility comments: Min A to bring BLE into bed    Transfers Overall transfer level: Needs assistance Equipment used: Rolling walker (2 wheels) Transfers: Sit to/from Stand, Bed to chair/wheelchair/BSC Sit to Stand: Mod assist     Step pivot transfers: Mod assist     General transfer comment: Mod A to power up to standing and take small steps to bed, pt with heavy posterior lean     Balance Overall balance assessment: Needs assistance Sitting-balance support: Bilateral upper extremity supported Sitting balance-Leahy Scale: Poor Sitting balance - Comments: intermittent min assist for balance correction, falling backwards. Postural control: Posterior lean Standing balance support: During functional activity, Reliant on assistive device for balance, Bilateral upper extremity supported Standing balance-Leahy Scale: Poor                             ADL either performed or assessed with clinical judgement   ADL Overall ADL's : Needs assistance/impaired Eating/Feeding: Set up;Sitting   Grooming: Set up;Sitting   Upper Body Bathing: Set up;Sitting   Lower Body Bathing: Maximal assistance;Sitting/lateral leans;Sit to/from stand;Adhering to back precautions   Upper Body Dressing : Set up;Sitting   Lower Body Dressing: Maximal assistance;Sitting/lateral leans;Sit to/from stand;Adhering to back precautions   Toilet Transfer: Moderate assistance;Stand-pivot   Toileting- Clothing Manipulation and Hygiene: Maximal assistance;Sitting/lateral lean;Sit to/from stand       Functional mobility during ADLs: Moderate assistance;Rolling walker (2 wheels) General ADL Comments: Assisted pt with re-setting up his lunch once transferred back to bed, along with cleaning his hands and face.    Extremity/Trunk Assessment Upper Extremity Assessment Upper Extremity Assessment: Overall WFL for tasks assessed  Lower Extremity Assessment Lower  Extremity Assessment: Generalized weakness   Cervical / Trunk Assessment Cervical / Trunk Assessment: Back Surgery    Vision Baseline Vision/History: 3 Glaucoma Ability to See in Adequate Light: 3 Highly impaired Patient Visual Report: No change from baseline Vision Assessment?: No apparent visual deficits Additional Comments: Pt legally blind at baseline   Perception Perception Perception: Not tested   Praxis Praxis Praxis: Not tested    Cognition Arousal/Alertness: Awake/alert Behavior During Therapy: WFL for tasks assessed/performed Overall Cognitive Status: Within Functional Limits for tasks assessed                                          Exercises      Shoulder Instructions       General Comments Educated pt and sister on spinal precautions and compensatory strategies for ADL's    Pertinent Vitals/ Pain       Pain Assessment Pain Assessment: Faces Pain Score: 7  Faces Pain Scale: Hurts little more Pain Location: back Pain Descriptors / Indicators: Aching, Operative site guarding Pain Intervention(s): Limited activity within patient's tolerance, Monitored during session, Repositioned  Home Living Family/patient expects to be discharged to:: Private residence Living Arrangements: Other relatives (sister) Available Help at Discharge: Family;Available 24 hours/day Type of Home: House Home Access: Stairs to enter [employer] of Steps: 1 (stoop)   Home Layout: One level     Bathroom Shower/Tub: Chief Strategy Officer: Handicapped height Bathroom Accessibility:  (unsure)   Home Equipment: Agricultural consultant (2 wheels);Rollator (4 wheels);Shower seat;Wheelchair - manual;Cane - single point          Prior Functioning/Environment              Frequency  Min 2X/week        Progress Toward Goals  OT Goals(current goals can now be found in the care plan section)  Progress towards OT goals: Progressing toward  goals  Acute Rehab OT Goals Patient Stated Goal: To get stronger and go home OT Goal Formulation: With patient Time For Goal Achievement: 11/15/22 Potential to Achieve Goals: Good ADL Goals Pt Will Perform Grooming: with min guard assist;standing Pt Will Perform Lower Body Bathing: with min assist;sitting/lateral leans;sit to/from stand Pt Will Perform Lower Body Dressing: with min assist;sitting/lateral leans;sit to/from stand Pt Will Transfer to Toilet: with min guard assist;ambulating Pt Will Perform Toileting - Clothing Manipulation and hygiene: with min guard assist;sitting/lateral leans;sit to/from stand;with adaptive equipment  Plan Discharge plan remains appropriate;Frequency remains appropriate    Co-evaluation                 AM-PAC OT "6 Clicks" Daily Activity     Outcome Measure   Help from another person eating meals?: A Little Help from another person taking care of personal grooming?: A Little Help from another person toileting, which includes using toliet, bedpan, or urinal?: A Lot Help from another person bathing (including washing, rinsing, drying)?: A Lot Help from another person to put on and taking off regular upper body clothing?: A Little Help from another person to put on and taking off regular lower body clothing?: A Lot 6 Click Score: 15    End of Session Equipment Utilized During Treatment: Gait belt;Rolling walker (2 wheels)  OT Visit Diagnosis: Unsteadiness on feet (R26.81);Other abnormalities of gait and mobility (R26.89);Muscle weakness (generalized) (M62.81)   Activity Tolerance Patient tolerated  treatment well   Patient Left in bed;with call bell/phone within reach;with family/visitor present   Nurse Communication Mobility status        Time: 1315-1345 OT Time Calculation (min): 30 min  Charges: OT General Charges $OT Visit: 1 Visit OT Evaluation $OT Eval Moderate Complexity: 1 Mod OT Treatments $Self Care/Home Management :  23-37 mins  Trish Mage, OTR/[facility]  Annjeanette Sarwar Elane Bing Plume 11/01/2022, 3:36 PM

## 2022-11-02 LAB — GLUCOSE, CAPILLARY
Glucose-Capillary: 99 mg/dL (ref 70–99)
Glucose-Capillary: 125 mg/dL — ABNORMAL HIGH (ref 70–99)
Glucose-Capillary: 156 mg/dL — ABNORMAL HIGH (ref 70–99)
Glucose-Capillary: 101 mg/dL — ABNORMAL HIGH (ref 70–99)

## 2022-11-02 MED ORDER — PANTOPRAZOLE SODIUM 40 MG PO TBEC
40.0000 mg | DELAYED_RELEASE_TABLET | Freq: Every day | ORAL | Status: DC
  Administered 2022-11-02 – 2022-11-04 (×3): 40 mg via ORAL
  Filled 2022-11-02 (×3): qty 1

## 2022-11-02 MED ORDER — BISACODYL 5 MG PO TBEC
5.0000 mg | DELAYED_RELEASE_TABLET | Freq: Every day | ORAL | Status: DC | PRN
  Administered 2022-11-02 – 2022-11-03 (×3): 5 mg via ORAL
  Filled 2022-11-02 (×3): qty 1

## 2022-11-02 MED ORDER — POLYETHYLENE GLYCOL 3350 17 G PO PACK
17.0000 g | PACK | Freq: Every day | ORAL | Status: DC
  Administered 2022-11-02 – 2022-11-03 (×2): 17 g via ORAL
  Filled 2022-11-02 (×2): qty 1

## 2022-11-02 NOTE — Progress Notes (Signed)
Postop day 2.  Overall doing better.  Back pain better controlled.  No lower extremity pain.  States legs feel stronger.  He is afebrile.  His vital signs are stable.  Had to have catheter placed secondary to urinary retention.  He is sitting up in a chair.  He appears comfortable.  Strength in both lower extremities is intact bilaterally.  Wound dressings clean and dry.  Progressing slowly following two-level lumbar decompression and fusion surgery.  Continue efforts at mobilization and therapy.  Continue urinary catheter for 24 hours for urinary retention.

## 2022-11-03 DIAGNOSIS — M48061 Spinal stenosis, lumbar region without neurogenic claudication: Secondary | ICD-10-CM | POA: Diagnosis not present

## 2022-11-03 LAB — GLUCOSE, CAPILLARY
Glucose-Capillary: 99 mg/dL (ref 70–99)
Glucose-Capillary: 112 mg/dL — ABNORMAL HIGH (ref 70–99)
Glucose-Capillary: 148 mg/dL — ABNORMAL HIGH (ref 70–99)
Glucose-Capillary: 141 mg/dL — ABNORMAL HIGH (ref 70–99)

## 2022-11-03 MED FILL — Thrombin For Soln 5000 Unit: CUTANEOUS | Qty: 5000 | Status: AC

## 2022-11-03 NOTE — Progress Notes (Signed)
Inpatient Rehab Admissions Coordinator:    I met with pt. To discuss potential CIR admit.  He and wife are interested, as Pt. Did well during previous CIR admission. Will send case to insurance once updated notes are in.   Megan Salon, MS, CCC-SLP Rehab Admissions Coordinator  [phone] (celll) [phone] (office)

## 2022-11-03 NOTE — Consult Note (Signed)
Physical Medicine and Rehabilitation Consult Reason for Consult:Lumbar spinal stenosis Referring Physician: Conchita Paris   HPI: Brian Daniels is a 67 y.o. male with hx C3 ASIA C/D incomplete quadriplegia with s/p multilevel discectomy C3-4, 4-5, 5-6 and C6-7 with decompression of spinal cord and exiting nerve roots with arthrodesis 08/05/2019 .  The patient developed increasing weakness and paresthesias as well as bilateral leg pain and was seen by neurosurgery.  Imaging studies performed outside of the epic system reportedly showed lumbar spinal stenosis.  He underwent L2-3 L3-4 posterior spinal decompression and fusion by Dr. Conchita Paris on 10/31/2022.  Intraoperative x-rays reviewed.  The patient suffered no postoperative anesthetic complications.  Some increased left hip weakness felt to be due to psoas injury.  Physical therapy evaluation performed on 11/01/2022 documented difficulty with left hip flexion.  Min assist with bed mobility mod assist with transfers.  OT evaluation performed on 11/01/2022 reported maximum assistance level with lower body bathing and dressing.  Toilet transfers moderate assistance. 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  In addition has a history of hypertension and diabetes as well as history of gout. Review of Systems  Constitutional:  Negative for chills and fever.  HENT:  Negative for congestion and nosebleeds.   Eyes:  Negative for discharge and redness.       Legally blind   Respiratory:  Negative for cough, sputum production, shortness of breath and stridor.   Cardiovascular:  Negative for chest pain.  Gastrointestinal:  Negative for nausea and vomiting.  Genitourinary:  Negative for hematuria.  Musculoskeletal:  Negative for back pain and neck pain.  Skin:  Negative for itching and rash.  Neurological:  Positive for sensory change and focal weakness.  Endo/Heme/Allergies:  Does not bruise/bleed easily.   Psychiatric/Behavioral:  The patient is not nervous/anxious.    Past Medical History:  Diagnosis Date   Cataracts, bilateral    removed by surgery   Diabetes mellitus without complication    type 2   Glaucoma    bilateral   Gout    Heart murmur    several years ago   Hypercholesteremia    Hypertension    Left atrial enlargement    Legally blind    bilateral   Neuropathy    hands, arm   Numbness    hands, arms   Wears partial dentures    lower   Wheelchair bound    since 05/30/19   Past Surgical History:  Procedure Laterality Date   ANTERIOR CERVICAL DECOMPRESSION/DISCECTOMY FUSION 4 LEVELS N/A 08/05/2019   Procedure: ANTERIOR CERVICAL DECOMPRESSION/DISCECTOMY FUSION CERVICAL THREE- CERVICAL FOUR, CERVICAL FOUR- CERVICAL FIVE, CERVICAL FIVE- CERVICAL SIX, CERVICAL SIX- CERVICAL SEVEN;  Surgeon: Lisbeth Renshaw, MD;  Location: [facility] OR;  Service: Neurosurgery;  Laterality: N/A;  anterior   COLONOSCOPY     EYE SURGERY Bilateral    multiple surgeries bilateral -    MULTIPLE TOOTH EXTRACTIONS     WISDOM TOOTH EXTRACTION     Family History  Problem Relation Age of Onset   Hyperlipidemia Mother    Diabetes Mother    Hypertension Father    Social History:  reports that he has never smoked. He has never used smokeless tobacco. He reports that he does not currently use alcohol. He reports that he does not use drugs. Allergies: No Known Allergies Medications Prior to Admission  Medication Sig Dispense Refill   amLODipine (NORVASC) 5 MG tablet Take 5 mg by mouth  daily.     Ascorbic Acid (VITAMIN C) 1000 MG tablet Take 1,000 mg by mouth daily.     aspirin EC 81 MG tablet Take 1 tablet (81 mg total) by mouth daily.     atorvastatin (LIPITOR) 20 MG tablet Take 1 tablet (20 mg total) by mouth daily. 30 tablet 1   benazepril (LOTENSIN) 10 MG tablet Take 1 tablet (10 mg total) by mouth daily. 30 tablet 1   BL CALCIUM-MAGNESIUM-ZINC PO Take 1 tablet by mouth daily.     brimonidine  (ALPHAGAN) 0.2 % ophthalmic solution Place 1 drop into both eyes 3 (three) times daily.     Cyanocobalamin (B-12) 5000 MCG CAPS Take 5,000 mcg by mouth daily.     dorzolamide-timolol (COSOPT) 2-0.5 % ophthalmic solution Place 1 drop into the left eye 2 (two) times daily.     GINSENG PO Take 2 tablets by mouth daily as needed (energy boost).     lidocaine (LIDODERM) 5 % Place 2 patches onto the skin daily. Remove & Discard patch within 12 hours or as directed by MD (Patient taking differently: Place 2 patches onto the skin daily as needed (pain). Remove & Discard patch within 12 hours or as directed by MD) 60 patch 0   meloxicam (MOBIC) 15 MG tablet Take 15 mg by mouth daily.     metFORMIN (GLUCOPHAGE-XR) 500 MG 24 hr tablet Take 1 tablet (500 mg total) by mouth at bedtime. 30 tablet 1   metoprolol succinate (TOPROL-XL) 25 MG 24 hr tablet Take 25 mg by mouth daily.     Omega-3 Fatty Acids (FISH OIL) 1000 MG CAPS Take 1,000 mg by mouth at bedtime.     OVER THE COUNTER MEDICATION Take 1 capsule by mouth daily. Super Beta Prostate     prednisoLONE acetate (PRED FORTE) 1 % ophthalmic suspension Place 1 drop into the right eye 2 (two) times daily.     sodium chloride (MURO 128) 5 % ophthalmic solution Place 1 drop into both eyes as needed for eye irritation.     timolol (TIMOPTIC) 0.5 % ophthalmic solution Place 1 drop into both eyes 2 (two) times daily.     vitamin E 180 MG (400 UNITS) capsule Take 180 mg by mouth daily.      acetaminophen (TYLENOL) 325 MG tablet Take 2 tablets (650 mg total) by mouth every 4 (four) hours as needed for mild pain ((score 1 to 3) or temp > 100.5).     baclofen (LIORESAL) 10 MG tablet Take 0.5 tablets (5 mg total) by mouth 3 (three) times daily. (Patient taking differently: Take 5 mg by mouth daily as needed for muscle spasms.) 150 each 3   bisacodyl (DULCOLAX) 10 MG suppository Place 1 suppository (10 mg total) rectally daily as needed for moderate constipation. 12  suppository 0   diclofenac Sodium (VOLTAREN) 1 % GEL Apply 2 g topically 4 (four) times daily as needed (pain). (Patient not taking: Reported on 10/17/2022) 2 g 1   DULoxetine (CYMBALTA) 60 MG capsule Take 1 capsule (60 mg total) by mouth at bedtime. (Patient not taking: Reported on 10/17/2022) 30 capsule 3   enoxaparin (LOVENOX) 40 MG/0.4ML injection lovenox 40 mg daily until 10/08/2019 and stop (Patient not taking: Reported on 10/17/2022) 0.4 mL 2   enoxaparin (LOVENOX) 40 MG/0.4ML injection Inject 0.4 mLs (40 mg total) into the skin daily. Quantity 30 syringes (Patient not taking: Reported on 10/17/2022) 30 mL 0   HYDROcodone-acetaminophen (NORCO/VICODIN) 5-325 MG tablet Take 1  tablet by mouth every 4 (four) hours as needed for moderate pain ((score 4 to 6)). (Patient not taking: Reported on 10/17/2022) 30 tablet 0   methocarbamol (ROBAXIN) 500 MG tablet Take 1 tablet (500 mg total) by mouth every 6 (six) hours as needed for muscle spasms (L shoulder pain/muscle tightness). (Patient not taking: Reported on 10/17/2022) 100 tablet 5   senna (SENOKOT) 8.6 MG TABS tablet Take 2 tablets (17.2 mg total) by mouth 2 (two) times daily. (Patient not taking: Reported on 10/17/2022) 120 tablet 0   tamsulosin (FLOMAX) 0.4 MG CAPS capsule TAKE 1 CAPSULE(0.4 MG) BY MOUTH DAILY AFTER SUPPER (Patient not taking: Reported on 10/17/2022) 90 capsule 1    Home: Home Living Family/patient expects to be discharged to:: Private residence Living Arrangements: Other relatives (sister) Available Help at Discharge: Family, Available 24 hours/day Type of Home: House Home Access: Stairs to enter [employer] of Steps: 1 (stoop) Home Layout: One level Bathroom Shower/Tub: Engineer, manufacturing systems: Handicapped height Bathroom Accessibility:  (unsure) Home Equipment: Agricultural consultant (2 wheels), Rollator (4 wheels), Shower seat, Wheelchair - manual, [employer] - single point  Functional History: Prior Function Prior Level of  Function : Independent/Modified Independent Mobility Comments: independent, use of rollator  and RW depending on where he was travelling out of the house .No use in house. ADLs Comments: ind Functional Status:  Mobility: Bed Mobility Overal bed mobility: Needs Assistance Bed Mobility: Rolling, Sit to Sidelying Rolling: Modified independent (Device/Increase time) Sidelying to sit: Min assist Sit to sidelying: Min assist General bed mobility comments: Min A to bring BLE into bed Transfers Overall transfer level: Needs assistance Equipment used: Rolling walker (2 wheels) Transfers: Sit to/from Stand, Bed to chair/wheelchair/BSC Sit to Stand: Mod assist Bed to/from chair/wheelchair/BSC transfer type:: Step pivot Step pivot transfers: Mod assist General transfer comment: Mod A to power up to standing and take small steps to bed, pt with heavy posterior lean Ambulation/Gait General Gait Details: Deferred due to LE buckling and reported dizziness with standing.    ADL: ADL Overall ADL's : Needs assistance/impaired Eating/Feeding: Set up, Sitting Grooming: Set up, Sitting Upper Body Bathing: Set up, Sitting Lower Body Bathing: Maximal assistance, Sitting/lateral leans, Sit to/from stand, Adhering to back precautions Upper Body Dressing : Set up, Sitting Lower Body Dressing: Maximal assistance, Sitting/lateral leans, Sit to/from stand, Adhering to back precautions Toilet Transfer: Moderate assistance, Stand-pivot Toileting- Clothing Manipulation and Hygiene: Maximal assistance, Sitting/lateral lean, Sit to/from stand Functional mobility during ADLs: Moderate assistance, Rolling walker (2 wheels) General ADL Comments: Assisted pt with re-setting up his lunch once transferred back to bed, along with cleaning his hands and face.  Cognition: Cognition Overall Cognitive Status: Within Functional Limits for tasks assessed Orientation Level: Oriented X4 Cognition Arousal/Alertness:  Awake/alert Behavior During Therapy: WFL for tasks assessed/performed Overall Cognitive Status: Within Functional Limits for tasks assessed  Blood pressure (!) 158/77, pulse 77, temperature 99 F (37.2 C), temperature source Oral, resp. rate 18, height 5\' 10"  (1.778 m), weight 76.2 kg, SpO2 94 %. Physical Exam Vitals and nursing note reviewed.  Constitutional:      General: He is not in acute distress.    Appearance: He is normal weight.  HENT:     Head: Normocephalic and atraumatic.     Nose: No congestion.     Mouth/Throat:     Mouth: Mucous membranes are moist.  Cardiovascular:     Rate and Rhythm: Normal rate and regular rhythm.     Heart sounds: Normal  heart sounds.  Pulmonary:     Effort: Pulmonary effort is normal. No respiratory distress.     Breath sounds: Normal breath sounds. No stridor.  Abdominal:     General: Abdomen is flat. Bowel sounds are normal. There is no distension.     Palpations: Abdomen is soft.     Tenderness: There is no abdominal tenderness.  Musculoskeletal:        General: Tenderness present. No swelling.  Skin:    General: Skin is warm and dry.  Neurological:     Mental Status: He is alert and oriented to person, place, and time.     Gait: Gait normal.  Psychiatric:        Mood and Affect: Mood normal.        Behavior: Behavior normal.   Motor strength is 4/5 bilateral deltoid, bicep, tricep, grip, hand intrinsic muscles.  Patient states that his sensation in the hands is a bit tingly but otherwise has intact light touch. Lower extremity sensation reduced left great toe Motor strength lower extremities left hip flexor to minus right hip flexor 3 knee extensors 4/5 ankle dorsiflexors 5/5 ankle plantar flexors 5/5 Sensation reduced left L5 distribution to light touch. Standing not tested  Results for orders placed or performed during the hospital encounter of 10/31/22 (from the past 24 hour(s))  Glucose, capillary     Status: Abnormal    Collection Time: 11/02/22 12:13 PM  Result Value Ref Range   Glucose-Capillary 125 (H) 70 - 99 mg/dL  Glucose, capillary     Status: Abnormal   Collection Time: 11/02/22  4:44 PM  Result Value Ref Range   Glucose-Capillary 156 (H) 70 - 99 mg/dL  Glucose, capillary     Status: Abnormal   Collection Time: 11/02/22  9:49 PM  Result Value Ref Range   Glucose-Capillary 101 (H) 70 - 99 mg/dL  Glucose, capillary     Status: None   Collection Time: 11/03/22  7:59 AM  Result Value Ref Range   Glucose-Capillary 99 70 - 99 mg/dL  Glucose, capillary     Status: Abnormal   Collection Time: 11/03/22 11:20 AM  Result Value Ref Range   Glucose-Capillary 112 (H) 70 - 99 mg/dL   No results found.   Assessment/Plan: Diagnosis: Lumbar spinal stenosis Does the need for close, 24 hr/day medical supervision in concert with the patient's rehab needs make it unreasonable for this patient to be served in a less intensive setting? Yes Co-Morbidities requiring supervision/potential complications: hx cervical myelopathy C3 ASIA C/D, gout with flare up Left great toe, hx blindness complicating rehab efforts  Due to bladder management, bowel management, safety, skin/wound care, disease management, medication administration, pain management, and patient education, does the patient require 24 hr/day rehab nursing? Yes Does the patient require coordinated care of a physician, rehab nurse, therapy disciplines of PT, OT to address physical and functional deficits in the context of the above medical diagnosis(es)? Yes Addressing deficits in the following areas: balance, endurance, locomotion, strength, transferring, bowel/bladder control, bathing, dressing, feeding, toileting, and psychosocial support Can the patient actively participate in an intensive therapy program of at least 3 hrs of therapy per day at least 5 days per week? Yes The potential for patient to make measurable gains while on inpatient rehab is  good Anticipated functional outcomes upon discharge from inpatient rehab are supervision  with PT, supervision with OT, n/a with SLP. Estimated rehab length of stay to reach the above functional goals is: 7-10d Anticipated  discharge destination: Home Overall Rehab/Functional Prognosis: good  RECOMMENDATIONS: This patient's condition is appropriate for continued rehabilitative care in the following setting: CIR Patient has agreed to participate in recommended program. Yes Note that insurance prior authorization may be required for reimbursement for recommended care.  Comment: prior cervical myelopathy with mild UE weakness and balance issues seen by Dr Berline Chough in [facility] 2021   Erick Colace, MD 11/03/2022

## 2022-11-03 NOTE — Progress Notes (Signed)
Physical Therapy Treatment Patient Details Name: Brian Daniels MRN: [id number] DOB: April 20, 1956 Today's Date: 11/03/2022   History of Present Illness 67 y.o. male admitted 4/19 s/p prone trans-psoas lateral interbody fusion L2-3, L3-4, with L2-4 laminectomy. PMH: Glaucoma (legally blind,) HTN, DM, gout.    PT Comments    Patient progressing with mobility and able to get into hallway, patient still reliant on assist for balance, lifting to stand from chair and for bed mobility.  He remains appropriate for intensive inpatient rehab prior to d/c home with family support. PT will continue to follow in acute setting.    Recommendations for follow up therapy are one component of a multi-disciplinary discharge planning process, led by the attending physician.  Recommendations may be updated based on patient status, additional functional criteria and insurance authorization.  Follow Up Recommendations  Can patient physically be transported by private vehicle: Yes    Assistance Recommended at Discharge Frequent or constant Supervision/Assistance  Patient can return home with the following A little help with walking and/or transfers;A lot of help with bathing/dressing/bathroom;Assist for transportation;Help with stairs or ramp for entrance;Assistance with cooking/housework   Equipment Recommendations  None recommended by PT    Recommendations for Other Services       Precautions / Restrictions Precautions Precautions: Fall;Back Precaution Booklet Issued: No Precaution Comments: legally bline Required Braces or Orthoses: Spinal Brace Spinal Brace: Lumbar corset;Applied in sitting position Restrictions Weight Bearing Restrictions: No     Mobility  Bed Mobility Overal bed mobility: Needs Assistance Bed Mobility: Sit to Sidelying         Sit to sidelying: Min assist General bed mobility comments: use of rail and assist for lifting each leg just over edge of bed; assist to reposition  trunk once supine    Transfers Overall transfer level: Needs assistance Equipment used: Rolling walker (2 wheels) Transfers: Sit to/from Stand Sit to Stand: Min assist, Mod assist           General transfer comment: up to stand from recliner with some lifting help and increased time for anterior weight shift, heavily reliant on UE's    Ambulation/Gait Ambulation/Gait assistance: Min assist Gait Distance (Feet): 60 Feet Assistive device: Rolling walker (2 wheels) Gait Pattern/deviations: Step-to pattern, Step-through pattern, Decreased stride length, Wide base of support, Decreased dorsiflexion - left, Shuffle       General Gait Details: flexed posture, cues to straighten, assist for balance esp turning walker in hallway, noted L foot toes dragging some, reports great toe pain worse than back, RN aware   Stairs             Wheelchair Mobility    Modified Rankin (Stroke Patients Only)       Balance Overall balance assessment: Needs assistance Sitting-balance support: Bilateral upper extremity supported Sitting balance-Leahy Scale: Poor Sitting balance - Comments: UE support to help himself lean forward from back of chair Postural control: Posterior lean Standing balance support: Bilateral upper extremity supported Standing balance-Leahy Scale: Poor Standing balance comment: reliant on RW                            Cognition Arousal/Alertness: Awake/alert Behavior During Therapy: WFL for tasks assessed/performed Overall Cognitive Status: Within Functional Limits for tasks assessed  Exercises      General Comments General comments (skin integrity, edema, etc.): remembered sequence for sit to supine without cues, VSS with mobility on RA      Pertinent Vitals/Pain Pain Assessment Faces Pain Scale: Hurts little more Pain Location: R great toe (feels it may be gout) Pain Descriptors /  Indicators: Aching, Discomfort Pain Intervention(s): Monitored during session, Limited activity within patient's tolerance    Home Living                          Prior Function            PT Goals (current goals can now be found in the care plan section) Progress towards PT goals: Progressing toward goals    Frequency    Min 4X/week      PT Plan Current plan remains appropriate    Co-evaluation              AM-PAC PT "6 Clicks" Mobility   Outcome Measure  Help needed turning from your back to your side while in a flat bed without using bedrails?: A Little Help needed moving from lying on your back to sitting on the side of a flat bed without using bedrails?: A Little Help needed moving to and from a bed to a chair (including a wheelchair)?: A Little Help needed standing up from a chair using your arms (e.g., wheelchair or bedside chair)?: A Lot Help needed to walk in hospital room?: A Little Help needed climbing 3-5 steps with a railing? : Total 6 Click Score: 15    End of Session Equipment Utilized During Treatment: Gait belt;Back brace Activity Tolerance: Patient tolerated treatment well;Patient limited by pain Patient left: in bed;with call bell/phone within reach;with bed alarm set   PT Visit Diagnosis: Unsteadiness on feet (R26.81);Muscle weakness (generalized) (M62.81);Difficulty in walking, not elsewhere classified (R26.2);Pain Pain - Right/Left: Left Pain - part of body: Ankle and joints of foot     Time: 1350-1416 PT Time Calculation (min) (ACUTE ONLY): 26 min  Charges:  $Gait Training: 8-22 mins $Therapeutic Activity: 8-22 mins                     Sheran Lawless, PT Acute Rehabilitation Services Office:[phone] 11/03/2022    Brian Daniels 11/03/2022, 2:21 PM

## 2022-11-03 NOTE — Progress Notes (Signed)
  NEUROSURGERY PROGRESS NOTE   No issues overnight. Pt with mild back soreness, improving. No leg pain.  EXAM:  BP (!) 158/77 (BP Location: Right Arm)   Pulse 77   Temp 99 F (37.2 C) (Oral)   Resp 18   Ht  (1.778 m)   Wt 76.2 kg   SpO2 94%   BMI 24.11 kg/m   Awake, alert, oriented  Speech fluent, appropriate  CN grossly intact  5/5 BUE/RLE Left PF/DF/Quad 5/5, 4-/5 IP Incisions c/d/i   IMPRESSION:  67 y.o. male POD#3 L2-4 posterior decompression/stabilization, L2-4 left trans-psoas interbody fusion. Neurologically at baseline.  PLAN: - Cont supportive care - May be CIR candidate  Lisbeth Renshaw, MD [facility]

## 2022-11-03 NOTE — Progress Notes (Signed)
Occupational Therapy Treatment Patient Details Name: Brian Daniels MRN: [id number] DOB: [birth year] Today's Date: 11/03/2022   History of present illness 67 y.o. male admitted 4/19 s/p prone trans-psoas lateral interbody fusion L2-3, L3-4, with L2-4 laminectomy. PMH: Glaucoma (legally blind,) HTN, DM, gout.   OT comments  Patient continues to make steady progress towards goals in skilled OT session. Patient's session encompassed  increasing activity tolerance through ADLs in standing and continued rehearsal of back precautions. Patient able to recall 2/3 back precautions consistently, and needs cues to adhere to upright posture when completing functional tasks. Patient min-mod A for sit<>stands, and requiring seated rest break after 2-3 minutes of ADLs at the sink prior to ambulation. Given patient's prior function, OT continues to advocate for intensive rehab >3 hours in order to return to prior level of function.    Recommendations for follow up therapy are one component of a multi-disciplinary discharge planning process, led by the attending physician.  Recommendations may be updated based on patient status, additional functional criteria and insurance authorization.    Assistance Recommended at Discharge Frequent or constant Supervision/Assistance  Patient can return home with the following  A little help with walking and/or transfers;A lot of help with bathing/dressing/bathroom;Direct supervision/assist for medications management;Direct supervision/assist for financial management;Assist for transportation;Help with stairs or ramp for entrance   Equipment Recommendations  BSC/3in1    Recommendations for Other Services      Precautions / Restrictions Precautions Precautions: Back Precaution Booklet Issued: No Precaution Comments: reviewed with patient, patient needing reminders for L (lifting) each time Required Braces or Orthoses: Spinal Brace Spinal Brace: Lumbar corset;Applied in  sitting position Restrictions Weight Bearing Restrictions: No       Mobility Bed Mobility               General bed mobility comments: up in recliner upon OT entry    Transfers Overall transfer level: Needs assistance Equipment used: Rolling walker (2 wheels) Transfers: Sit to/from Stand Sit to Stand: Min assist, Mod assist           General transfer comment: min to mod A in order to come into standing depending on fatigue, cues to scoot to the front of the chair, and use the rail instead of the walker to come into standing, cues to stand in an upright position     Balance Overall balance assessment: Needs assistance Sitting-balance support: Bilateral upper extremity supported Sitting balance-Leahy Scale: Fair     Standing balance support: During functional activity, Reliant on assistive device for balance, Bilateral upper extremity supported Standing balance-Leahy Scale: Poor Standing balance comment: reliant on RW                           ADL either performed or assessed with clinical judgement   ADL Overall ADL's : Needs assistance/impaired     Grooming: Wash/dry hands;Wash/dry face;Oral care;Min guard;Standing Grooming Details (indicate cue type and reason): cues to use cup to spit in after oral care instead of bending at waist                 Toilet Transfer: Minimal assistance;Cueing for safety;Cueing for sequencing;Rolling walker (2 wheels) Toilet Transfer Details (indicate cue type and reason): simulated with sit<>stands in room         Functional mobility during ADLs: Moderate assistance;Rolling walker (2 wheels) General ADL Comments: Patient session focus on increasing activity tolerance through ADLs in standing and continued rehearsal of back  precautions.    Extremity/Trunk Assessment              Vision   Additional Comments: Legally blind in both eyes at baseline   Perception     Praxis      Cognition  Arousal/Alertness: Awake/alert Behavior During Therapy: WFL for tasks assessed/performed Overall Cognitive Status: Within Functional Limits for tasks assessed                                 General Comments: potential STM deficits, could not recall acronym and 2/3 back precautions consistently        Exercises      Shoulder Instructions       General Comments      Pertinent Vitals/ Pain       Pain Assessment Pain Assessment: Faces Faces Pain Scale: Hurts a little bit Pain Location: back Pain Descriptors / Indicators: Aching, Operative site guarding Pain Intervention(s): Limited activity within patient's tolerance, Monitored during session, Repositioned  Home Living                                          Prior Functioning/Environment              Frequency  Min 2X/week        Progress Toward Goals  OT Goals(current goals can now be found in the care plan section)  Progress towards OT goals: Progressing toward goals  Acute Rehab OT Goals Patient Stated Goal: to get better OT Goal Formulation: With patient Time For Goal Achievement: 11/15/22 Potential to Achieve Goals: Good  Plan Discharge plan remains appropriate;Frequency remains appropriate    Co-evaluation                 AM-PAC OT "6 Clicks" Daily Activity     Outcome Measure   Help from another person eating meals?: A Little Help from another person taking care of personal grooming?: A Little Help from another person toileting, which includes using toliet, bedpan, or urinal?: A Lot Help from another person bathing (including washing, rinsing, drying)?: A Lot Help from another person to put on and taking off regular upper body clothing?: A Little Help from another person to put on and taking off regular lower body clothing?: A Lot 6 Click Score: 15    End of Session Equipment Utilized During Treatment: Rolling walker (2 wheels);Back brace  OT Visit  Diagnosis: Unsteadiness on feet (R26.81);Other abnormalities of gait and mobility (R26.89);Muscle weakness (generalized) (M62.81)   Activity Tolerance Patient tolerated treatment well   Patient Left in chair;with call bell/phone within reach;with chair alarm set;with family/visitor present   Nurse Communication Mobility status        Time: 1610-9604 OT Time Calculation (min): 27 min  Charges: OT General Charges $OT Visit: 1 Visit OT Treatments $Self Care/Home Management : 23-37 mins  Pollyann Glen E. Malisa Ruggiero, OTR/L Acute Rehabilitation Services [phone]   Cherlyn Cushing 11/03/2022, 12:53 PM

## 2022-11-03 NOTE — Care Management Important Message (Signed)
Important Message  Patient Details  Name: RUDOLFO BRANDOW MRN: [id number] Date of Birth: [birth year]   Medicare Important Message Given:  Yes     Sherilyn Banker 11/03/2022, 1:00 PM

## 2022-11-04 ENCOUNTER — Other Ambulatory Visit: Payer: Self-pay

## 2022-11-04 ENCOUNTER — Encounter (HOSPITAL_COMMUNITY): Payer: Self-pay | Admitting: Physical Medicine and Rehabilitation

## 2022-11-04 ENCOUNTER — Inpatient Hospital Stay (HOSPITAL_COMMUNITY)
Admission: RE | Admit: 2022-11-04 | Discharge: 2022-11-14 | DRG: 552 | Disposition: A | Discharge status: Home / Self Care | Payer: Medicare Other | Source: Intra-hospital | Attending: Physical Medicine and Rehabilitation | Admitting: Physical Medicine and Rehabilitation

## 2022-11-04 DIAGNOSIS — Z791 Long term (current) use of non-steroidal anti-inflammatories (NSAID): Secondary | ICD-10-CM | POA: Diagnosis not present

## 2022-11-04 DIAGNOSIS — E876 Hypokalemia: Secondary | ICD-10-CM | POA: Diagnosis not present

## 2022-11-04 DIAGNOSIS — Z833 Family history of diabetes mellitus: Secondary | ICD-10-CM

## 2022-11-04 DIAGNOSIS — B9620 Unspecified Escherichia coli [E. coli] as the cause of diseases classified elsewhere: Secondary | ICD-10-CM | POA: Diagnosis present

## 2022-11-04 DIAGNOSIS — H409 Unspecified glaucoma: Secondary | ICD-10-CM | POA: Diagnosis present

## 2022-11-04 DIAGNOSIS — L89132 Pressure ulcer of right lower back, stage 2: Secondary | ICD-10-CM | POA: Diagnosis present

## 2022-11-04 DIAGNOSIS — G8929 Other chronic pain: Secondary | ICD-10-CM | POA: Diagnosis present

## 2022-11-04 DIAGNOSIS — Z79899 Other long term (current) drug therapy: Secondary | ICD-10-CM

## 2022-11-04 DIAGNOSIS — E119 Type 2 diabetes mellitus without complications: Secondary | ICD-10-CM | POA: Diagnosis present

## 2022-11-04 DIAGNOSIS — L89142 Pressure ulcer of left lower back, stage 2: Secondary | ICD-10-CM | POA: Diagnosis present

## 2022-11-04 DIAGNOSIS — R3911 Hesitancy of micturition: Secondary | ICD-10-CM | POA: Diagnosis present

## 2022-11-04 DIAGNOSIS — Z7984 Long term (current) use of oral hypoglycemic drugs: Secondary | ICD-10-CM

## 2022-11-04 DIAGNOSIS — Z7982 Long term (current) use of aspirin: Secondary | ICD-10-CM | POA: Diagnosis not present

## 2022-11-04 DIAGNOSIS — Z993 Dependence on wheelchair: Secondary | ICD-10-CM | POA: Diagnosis not present

## 2022-11-04 DIAGNOSIS — M7989 Other specified soft tissue disorders: Secondary | ICD-10-CM | POA: Diagnosis not present

## 2022-11-04 DIAGNOSIS — M48061 Spinal stenosis, lumbar region without neurogenic claudication: Secondary | ICD-10-CM | POA: Diagnosis present

## 2022-11-04 DIAGNOSIS — N390 Urinary tract infection, site not specified: Secondary | ICD-10-CM | POA: Diagnosis present

## 2022-11-04 DIAGNOSIS — H548 Legal blindness, as defined in USA: Secondary | ICD-10-CM | POA: Diagnosis present

## 2022-11-04 DIAGNOSIS — M109 Gout, unspecified: Secondary | ICD-10-CM | POA: Diagnosis present

## 2022-11-04 DIAGNOSIS — K5900 Constipation, unspecified: Secondary | ICD-10-CM | POA: Diagnosis present

## 2022-11-04 DIAGNOSIS — I10 Essential (primary) hypertension: Secondary | ICD-10-CM | POA: Diagnosis present

## 2022-11-04 DIAGNOSIS — G992 Myelopathy in diseases classified elsewhere: Secondary | ICD-10-CM | POA: Diagnosis present

## 2022-11-04 DIAGNOSIS — Z981 Arthrodesis status: Secondary | ICD-10-CM

## 2022-11-04 DIAGNOSIS — G959 Disease of spinal cord, unspecified: Secondary | ICD-10-CM | POA: Diagnosis not present

## 2022-11-04 DIAGNOSIS — E7800 Pure hypercholesterolemia, unspecified: Secondary | ICD-10-CM | POA: Diagnosis present

## 2022-11-04 LAB — GLUCOSE, CAPILLARY
Glucose-Capillary: 90 mg/dL (ref 70–99)
Glucose-Capillary: 126 mg/dL — ABNORMAL HIGH (ref 70–99)
Glucose-Capillary: 133 mg/dL — ABNORMAL HIGH (ref 70–99)
Glucose-Capillary: 123 mg/dL — ABNORMAL HIGH (ref 70–99)

## 2022-11-04 MED ORDER — ONDANSETRON HCL 4 MG PO TABS: 4.0000 mg | ORAL_TABLET | Freq: Four times a day (QID) | ORAL | Status: DC | PRN

## 2022-11-04 MED ORDER — DIPHENHYDRAMINE HCL 12.5 MG/5ML PO ELIX: 12.5000 mg | ORAL_SOLUTION | Freq: Four times a day (QID) | ORAL | Status: DC | PRN

## 2022-11-04 MED ORDER — OXYCODONE HCL 5 MG PO TABS
10.0000 mg | ORAL_TABLET | ORAL | Status: DC | PRN
  Administered 2022-11-05 – 2022-11-14 (×8): 10 mg via ORAL
  Filled 2022-11-04 (×10): qty 2

## 2022-11-04 MED ORDER — OXYCODONE HCL 5 MG PO TABS
5.0000 mg | ORAL_TABLET | ORAL | Status: DC | PRN
  Administered 2022-11-05 – 2022-11-13 (×24): 5 mg via ORAL
  Filled 2022-11-04 (×23): qty 1

## 2022-11-04 MED ORDER — FLEET ENEMA 7-19 GM/118ML RE ENEM: 1.0000 | ENEMA | Freq: Once | RECTAL | Status: DC | PRN

## 2022-11-04 MED ORDER — GUAIFENESIN-DM 100-10 MG/5ML PO SYRP: 5.0000 mL | ORAL_SOLUTION | Freq: Four times a day (QID) | ORAL | Status: DC | PRN

## 2022-11-04 MED ORDER — ACETAMINOPHEN 325 MG PO TABS
325.0000 mg | ORAL_TABLET | ORAL | Status: DC | PRN
  Administered 2022-11-08 – 2022-11-14 (×18): 650 mg via ORAL
  Filled 2022-11-04 (×20): qty 2

## 2022-11-04 MED ORDER — METOPROLOL SUCCINATE ER 25 MG PO TB24
25.0000 mg | ORAL_TABLET | Freq: Every day | ORAL | Status: DC
  Administered 2022-11-05 – 2022-11-14 (×10): 25 mg via ORAL
  Filled 2022-11-04 (×10): qty 1

## 2022-11-04 MED ORDER — SORBITOL 70 % SOLN: 30.0000 mL | Freq: Every day | Status: DC | PRN

## 2022-11-04 MED ORDER — BENAZEPRIL HCL 5 MG PO TABS
10.0000 mg | ORAL_TABLET | Freq: Every day | ORAL | Status: DC
  Administered 2022-11-05 – 2022-11-14 (×10): 10 mg via ORAL
  Filled 2022-11-04 (×11): qty 2

## 2022-11-04 MED ORDER — PREDNISOLONE ACETATE 1 % OP SUSP
1.0000 [drp] | Freq: Two times a day (BID) | OPHTHALMIC | Status: DC
  Administered 2022-11-04 – 2022-11-14 (×20): 1 [drp] via OPHTHALMIC
  Filled 2022-11-04: qty 5

## 2022-11-04 MED ORDER — BACLOFEN 5 MG HALF TABLET: 5.0000 mg | ORAL_TABLET | Freq: Every day | ORAL | Status: DC | PRN

## 2022-11-04 MED ORDER — TRAZODONE HCL 50 MG PO TABS: 25.0000 mg | ORAL_TABLET | Freq: Every evening | ORAL | Status: DC | PRN

## 2022-11-04 MED ORDER — DOCUSATE SODIUM 100 MG PO CAPS
100.0000 mg | ORAL_CAPSULE | Freq: Two times a day (BID) | ORAL | Status: DC
  Administered 2022-11-04 – 2022-11-08 (×8): 100 mg via ORAL
  Filled 2022-11-04 (×9): qty 1

## 2022-11-04 MED ORDER — INSULIN ASPART 100 UNIT/ML IJ SOLN
0.0000 [IU] | Freq: Three times a day (TID) | INTRAMUSCULAR | Status: DC
  Administered 2022-11-05 – 2022-11-08 (×2): 2 [IU] via SUBCUTANEOUS

## 2022-11-04 MED ORDER — ONDANSETRON HCL 4 MG/2ML IJ SOLN: 4.0000 mg | Freq: Four times a day (QID) | INTRAMUSCULAR | Status: DC | PRN

## 2022-11-04 MED ORDER — PANTOPRAZOLE SODIUM 40 MG PO TBEC
40.0000 mg | DELAYED_RELEASE_TABLET | Freq: Every day | ORAL | Status: DC
  Administered 2022-11-05 – 2022-11-13 (×9): 40 mg via ORAL
  Filled 2022-11-04 (×9): qty 1

## 2022-11-04 MED ORDER — METHOCARBAMOL 1000 MG/10ML IJ SOLN: 500.0000 mg | Freq: Four times a day (QID) | INTRAVENOUS | Status: DC | PRN

## 2022-11-04 MED ORDER — POLYETHYLENE GLYCOL 3350 17 G PO PACK
17.0000 g | PACK | Freq: Every day | ORAL | Status: DC
  Administered 2022-11-04 – 2022-11-13 (×4): 17 g via ORAL
  Filled 2022-11-04 (×9): qty 1

## 2022-11-04 MED ORDER — AMLODIPINE BESYLATE 5 MG PO TABS
5.0000 mg | ORAL_TABLET | Freq: Every day | ORAL | Status: DC
  Administered 2022-11-05 – 2022-11-14 (×10): 5 mg via ORAL
  Filled 2022-11-04 (×10): qty 1

## 2022-11-04 MED ORDER — MAGNESIUM HYDROXIDE 400 MG/5ML PO SUSP: 30.0000 mL | Freq: Every day | ORAL | Status: DC | PRN

## 2022-11-04 MED ORDER — METFORMIN HCL ER 500 MG PO TB24
500.0000 mg | ORAL_TABLET | Freq: Every day | ORAL | Status: DC
  Administered 2022-11-05 – 2022-11-13 (×9): 500 mg via ORAL
  Filled 2022-11-04 (×9): qty 1

## 2022-11-04 MED ORDER — ATORVASTATIN CALCIUM 10 MG PO TABS
20.0000 mg | ORAL_TABLET | Freq: Every day | ORAL | Status: DC
  Administered 2022-11-05 – 2022-11-14 (×10): 20 mg via ORAL
  Filled 2022-11-04 (×11): qty 2

## 2022-11-04 MED ORDER — ALUM & MAG HYDROXIDE-SIMETH 200-200-20 MG/5ML PO SUSP: 30.0000 mL | ORAL | Status: DC | PRN

## 2022-11-04 MED ORDER — DORZOLAMIDE HCL-TIMOLOL MAL 2-0.5 % OP SOLN
1.0000 [drp] | Freq: Two times a day (BID) | OPHTHALMIC | Status: DC
  Administered 2022-11-04 – 2022-11-14 (×20): 1 [drp] via OPHTHALMIC
  Filled 2022-11-04: qty 10

## 2022-11-04 MED ORDER — SENNA 8.6 MG PO TABS
1.0000 | ORAL_TABLET | Freq: Two times a day (BID) | ORAL | Status: DC
  Administered 2022-11-04 – 2022-11-07 (×7): 8.6 mg via ORAL
  Filled 2022-11-04 (×9): qty 1

## 2022-11-04 MED ORDER — METHOCARBAMOL 500 MG PO TABS
500.0000 mg | ORAL_TABLET | Freq: Four times a day (QID) | ORAL | Status: DC | PRN
  Administered 2022-11-08 – 2022-11-13 (×6): 500 mg via ORAL
  Filled 2022-11-04 (×6): qty 1

## 2022-11-04 MED ORDER — BRIMONIDINE TARTRATE 0.2 % OP SOLN
1.0000 [drp] | Freq: Three times a day (TID) | OPHTHALMIC | Status: DC
  Administered 2022-11-04 – 2022-11-14 (×29): 1 [drp] via OPHTHALMIC
  Filled 2022-11-04: qty 5

## 2022-11-04 NOTE — H&P (Signed)
Physical Medicine and Rehabilitation Admission H&P     CC: Functional deficits secondary to lumbar myelopathy   HPI: Brian Daniels is a 67 year old L handed male who presented for elective lumbar laminectomy on 10/31/2022 due to severe multifactorial stenosis at L2-3, L3-4 with associated chronic bilateral leg pain, paresthesias, and subjective heaviness with difficulty walking. He was taken to the OR on 4/19 and underwent  L2, L3 laminectomy with radical bilateral facetectomy for decompression by Dr. Conchita Paris on 4/20, neurologically at baseline, perhaps slightly worsened left hip flexion weakness likely from psoas injury.  Mod assist with transfers and mobility and max assist with lower body bathing and dressing.  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 History of ACDF 07/2019. Has history of DM-2, hypertension, hyperlipidemia, gout. Tolerating carb modifed diet. Complaining of urinary hesitancy/frequency. Was taking Beta-prostate OTC. Says tamulosin seemed to poorly affect his vision in the past. The patient requires inpatient medicine and rehabilitation evaluations and services for ongoing dysfunction secondary to lumbar myelopathy.    Pt reports legally blind due to glaucoma-  Back hurting- 6/10- but he "expects that"- meds help back pain. Denies muscle spasms- says is getting meds for it so doesn't think has spasms.    LBM yesterday but still feels constipated.  Has condom cath and voiding well- hard to use urinal due to blindness.      Review of Systems  Constitutional:  Negative for chills and fever.  HENT:  Negative for hearing loss and sore throat.   Eyes:        Legally blind  Respiratory:  Negative for cough and shortness of breath.   Cardiovascular:  Negative for chest pain and palpitations.  Gastrointestinal:  Positive for constipation. Negative for nausea and vomiting.  Genitourinary:  Positive for frequency and urgency.  Musculoskeletal:  Positive for back pain.       Post-op  pain well controlled  Neurological:  Negative for dizziness and headaches.  All other systems reviewed and are negative.       Past Medical History:  Diagnosis Date   Cataracts, bilateral      removed by surgery   Diabetes mellitus without complication      type 2   Glaucoma      bilateral   Gout     Heart murmur      several years ago   Hypercholesteremia     Hypertension     Left atrial enlargement     Legally blind      bilateral   Neuropathy      hands, arm   Numbness      hands, arms   Wears partial dentures      lower   Wheelchair bound      since 05/30/19         Past Surgical History:  Procedure Laterality Date   ANTERIOR CERVICAL DECOMPRESSION/DISCECTOMY FUSION 4 LEVELS N/A 08/05/2019    Procedure: ANTERIOR CERVICAL DECOMPRESSION/DISCECTOMY FUSION CERVICAL THREE- CERVICAL FOUR, CERVICAL FOUR- CERVICAL FIVE, CERVICAL FIVE- CERVICAL SIX, CERVICAL SIX- CERVICAL SEVEN;  Surgeon: Lisbeth Renshaw, MD;  Location: [facility] OR;  Service: Neurosurgery;  Laterality: N/A;  anterior   COLONOSCOPY       EYE SURGERY Bilateral      multiple surgeries bilateral -    MULTIPLE TOOTH EXTRACTIONS       WISDOM TOOTH EXTRACTION             Family History  Problem Relation Age of Onset   Hyperlipidemia  Mother     Diabetes Mother     Hypertension Father      Social History:  reports that he has never smoked. He has never used smokeless tobacco. He reports that he does not currently use alcohol. He reports that he does not use drugs. Allergies: No Known Allergies       Medications Prior to Admission  Medication Sig Dispense Refill   amLODipine (NORVASC) 5 MG tablet Take 5 mg by mouth daily.       Ascorbic Acid (VITAMIN C) 1000 MG tablet Take 1,000 mg by mouth daily.       aspirin EC 81 MG tablet Take 1 tablet (81 mg total) by mouth daily.       atorvastatin (LIPITOR) 20 MG tablet Take 1 tablet (20 mg total) by mouth daily. 30 tablet 1   benazepril (LOTENSIN) 10 MG tablet Take 1  tablet (10 mg total) by mouth daily. 30 tablet 1   BL CALCIUM-MAGNESIUM-ZINC PO Take 1 tablet by mouth daily.       brimonidine (ALPHAGAN) 0.2 % ophthalmic solution Place 1 drop into both eyes 3 (three) times daily.       Cyanocobalamin (B-12) 5000 MCG CAPS Take 5,000 mcg by mouth daily.       dorzolamide-timolol (COSOPT) 2-0.5 % ophthalmic solution Place 1 drop into the left eye 2 (two) times daily.       GINSENG PO Take 2 tablets by mouth daily as needed (energy boost).       lidocaine (LIDODERM) 5 % Place 2 patches onto the skin daily. Remove & Discard patch within 12 hours or as directed by MD (Patient taking differently: Place 2 patches onto the skin daily as needed (pain). Remove & Discard patch within 12 hours or as directed by MD) 60 patch 0   meloxicam (MOBIC) 15 MG tablet Take 15 mg by mouth daily.       metFORMIN (GLUCOPHAGE-XR) 500 MG 24 hr tablet Take 1 tablet (500 mg total) by mouth at bedtime. 30 tablet 1   metoprolol succinate (TOPROL-XL) 25 MG 24 hr tablet Take 25 mg by mouth daily.       Omega-3 Fatty Acids (FISH OIL) 1000 MG CAPS Take 1,000 mg by mouth at bedtime.       OVER THE COUNTER MEDICATION Take 1 capsule by mouth daily. Super Beta Prostate       prednisoLONE acetate (PRED FORTE) 1 % ophthalmic suspension Place 1 drop into the right eye 2 (two) times daily.       sodium chloride (MURO 128) 5 % ophthalmic solution Place 1 drop into both eyes as needed for eye irritation.       timolol (TIMOPTIC) 0.5 % ophthalmic solution Place 1 drop into both eyes 2 (two) times daily.       vitamin E 180 MG (400 UNITS) capsule Take 180 mg by mouth daily.        acetaminophen (TYLENOL) 325 MG tablet Take 2 tablets (650 mg total) by mouth every 4 (four) hours as needed for mild pain ((score 1 to 3) or temp > 100.5).       baclofen (LIORESAL) 10 MG tablet Take 0.5 tablets (5 mg total) by mouth 3 (three) times daily. (Patient taking differently: Take 5 mg by mouth daily as needed for muscle  spasms.) 150 each 3   bisacodyl (DULCOLAX) 10 MG suppository Place 1 suppository (10 mg total) rectally daily as needed for moderate constipation. 12 suppository 0  diclofenac Sodium (VOLTAREN) 1 % GEL Apply 2 g topically 4 (four) times daily as needed (pain). (Patient not taking: Reported on 10/17/2022) 2 g 1   DULoxetine (CYMBALTA) 60 MG capsule Take 1 capsule (60 mg total) by mouth at bedtime. (Patient not taking: Reported on 10/17/2022) 30 capsule 3   enoxaparin (LOVENOX) 40 MG/0.4ML injection lovenox 40 mg daily until 10/08/2019 and stop (Patient not taking: Reported on 10/17/2022) 0.4 mL 2   enoxaparin (LOVENOX) 40 MG/0.4ML injection Inject 0.4 mLs (40 mg total) into the skin daily. Quantity 30 syringes (Patient not taking: Reported on 10/17/2022) 30 mL 0   HYDROcodone-acetaminophen (NORCO/VICODIN) 5-325 MG tablet Take 1 tablet by mouth every 4 (four) hours as needed for moderate pain ((score 4 to 6)). (Patient not taking: Reported on 10/17/2022) 30 tablet 0   methocarbamol (ROBAXIN) 500 MG tablet Take 1 tablet (500 mg total) by mouth every 6 (six) hours as needed for muscle spasms (L shoulder pain/muscle tightness). (Patient not taking: Reported on 10/17/2022) 100 tablet 5   senna (SENOKOT) 8.6 MG TABS tablet Take 2 tablets (17.2 mg total) by mouth 2 (two) times daily. (Patient not taking: Reported on 10/17/2022) 120 tablet 0   tamsulosin (FLOMAX) 0.4 MG CAPS capsule TAKE 1 CAPSULE(0.4 MG) BY MOUTH DAILY AFTER SUPPER (Patient not taking: Reported on 10/17/2022) 90 capsule 1          Home: Home Living Family/patient expects to be discharged to:: Private residence Living Arrangements: Other relatives (sister) Available Help at Discharge: Family, Available 24 hours/day Type of Home: House Home Access: Stairs to enter [employer] of Steps: 1 (stoop) Home Layout: One level Bathroom Shower/Tub: Engineer, manufacturing systems: Handicapped height Bathroom Accessibility:  (unsure) Home Equipment:  Agricultural consultant (2 wheels), Rollator (4 wheels), Shower seat, Wheelchair - manual, [employer] - single point   Functional History: Prior Function Prior Level of Function : Independent/Modified Independent Mobility Comments: independent, use of rollator  and RW depending on where he was travelling out of the house .No use in house. ADLs Comments: ind   Functional Status:  Mobility: Bed Mobility Overal bed mobility: Needs Assistance Bed Mobility: Sit to Sidelying Rolling: Modified independent (Device/Increase time) Sidelying to sit: Min assist Sit to sidelying: Min assist General bed mobility comments: use of rail and assist for lifting each leg just over edge of bed; assist to reposition trunk once supine Transfers Overall transfer level: Needs assistance Equipment used: Rolling walker (2 wheels) Transfers: Sit to/from Stand Sit to Stand: Min assist, Mod assist Bed to/from chair/wheelchair/BSC transfer type:: Step pivot Step pivot transfers: Mod assist General transfer comment: up to stand from recliner with some lifting help and increased time for anterior weight shift, heavily reliant on UE's Ambulation/Gait Ambulation/Gait assistance: Min assist Gait Distance (Feet): 60 Feet Assistive device: Rolling walker (2 wheels) Gait Pattern/deviations: Step-to pattern, Step-through pattern, Decreased stride length, Wide base of support, Decreased dorsiflexion - left, Shuffle General Gait Details: flexed posture, cues to straighten, assist for balance esp turning walker in hallway, noted L foot toes dragging some, reports great toe pain worse than back, RN aware   ADL: ADL Overall ADL's : Needs assistance/impaired Eating/Feeding: Set up, Sitting Grooming: Wash/dry hands, Wash/dry face, Oral care, Min guard, Standing Grooming Details (indicate cue type and reason): cues to use cup to spit in after oral care instead of bending at waist Upper Body Bathing: Set up, Sitting Lower Body Bathing:  Maximal assistance, Sitting/lateral leans, Sit to/from stand, Adhering to back precautions Upper  Body Dressing : Set up, Sitting Lower Body Dressing: Maximal assistance, Sitting/lateral leans, Sit to/from stand, Adhering to back precautions Toilet Transfer: Minimal assistance, Cueing for safety, Cueing for sequencing, Rolling walker (2 wheels) Toilet Transfer Details (indicate cue type and reason): simulated with sit<>stands in room Toileting- Clothing Manipulation and Hygiene: Maximal assistance, Sitting/lateral lean, Sit to/from stand Functional mobility during ADLs: Moderate assistance, Rolling walker (2 wheels) General ADL Comments: Patient session focus on increasing activity tolerance through ADLs in standing and continued rehearsal of back precautions.   Cognition: Cognition Overall Cognitive Status: Within Functional Limits for tasks assessed Orientation Level: Oriented X4 Cognition Arousal/Alertness: Awake/alert Behavior During Therapy: WFL for tasks assessed/performed Overall Cognitive Status: Within Functional Limits for tasks assessed General Comments: potential STM deficits, could not recall acronym and 2/3 back precautions consistently   Physical Exam: Blood pressure (!) 158/75, pulse 63, temperature 97.8 F (36.6 C), temperature source Oral, resp. rate 18, height  (1.778 m), weight 76.2 kg, SpO2 100 %. Physical Exam Vitals and nursing note reviewed.  Constitutional:      General: He is not in acute distress.    Appearance: Normal appearance. He is normal weight. He is not ill-appearing.     Comments: Pt sitting in bedside chair; but transferred with NT to bed with RW, NAD  HENT:     Head: Normocephalic and atraumatic.     Right Ear: External ear normal.     Left Ear: External ear normal.     Nose: Nose normal. No congestion.     Mouth/Throat:     Mouth: Mucous membranes are dry.     Pharynx: Oropharynx is clear. No oropharyngeal exudate.  Eyes:     General:         Right eye: No discharge.        Left eye: No discharge.     Comments: B/L eyes appear whitish from glaucoma  Cardiovascular:     Rate and Rhythm: Normal rate and regular rhythm.     Heart sounds: Normal heart sounds. No murmur heard.    No gallop.  Pulmonary:     Effort: Pulmonary effort is normal. No respiratory distress.     Breath sounds: Normal breath sounds. No wheezing, rhonchi or rales.  Abdominal:     General: There is distension.     Palpations: Abdomen is soft.     Tenderness: There is no abdominal tenderness.     Comments: Slightly hypoactive  Genitourinary:    Comments: Condom cath in place- medium amber urine in bag Musculoskeletal:     Cervical back: Neck supple. No tenderness.     Right lower leg: No edema.     Left lower leg: No edema.     Comments: Ue's 5/5 B/L RLE- 5-/5 except HF on R 4+/5 LLE- 5-5/5 except L HF 4+/5  Skin:    General: Skin is warm and dry.     Comments: PIV in dorsum of each hand without erythema Lumbar incision and L groin/abd incision with honeycomb dressing in place- look OK  Neurological:     Mental Status: He is alert and oriented to person, place, and time.     Comments: Intact to light touch in Ue's and torso L1 numbness and tingling L>R; toherwise intact to light touch in all 4 extremities  Psychiatric:        Mood and Affect: Mood normal.        Behavior: Behavior normal.  Lab Results Last 48 Hours        Results for orders placed or performed during the hospital encounter of 10/31/22 (from the past 48 hour(s))  Glucose, capillary     Status: Abnormal    Collection Time: 11/02/22  4:44 PM  Result Value Ref Range    Glucose-Capillary 156 (H) 70 - 99 mg/dL      Comment: Glucose reference range applies only to samples taken after fasting for at least 8 hours.  Glucose, capillary     Status: Abnormal    Collection Time: 11/02/22  9:49 PM  Result Value Ref Range    Glucose-Capillary 101 (H) 70 - 99 mg/dL       Comment: Glucose reference range applies only to samples taken after fasting for at least 8 hours.  Glucose, capillary     Status: None    Collection Time: 11/03/22  7:59 AM  Result Value Ref Range    Glucose-Capillary 99 70 - 99 mg/dL      Comment: Glucose reference range applies only to samples taken after fasting for at least 8 hours.  Glucose, capillary     Status: Abnormal    Collection Time: 11/03/22 11:20 AM  Result Value Ref Range    Glucose-Capillary 112 (H) 70 - 99 mg/dL      Comment: Glucose reference range applies only to samples taken after fasting for at least 8 hours.  Glucose, capillary     Status: Abnormal    Collection Time: 11/03/22  5:14 PM  Result Value Ref Range    Glucose-Capillary 148 (H) 70 - 99 mg/dL      Comment: Glucose reference range applies only to samples taken after fasting for at least 8 hours.  Glucose, capillary     Status: Abnormal    Collection Time: 11/03/22  8:28 PM  Result Value Ref Range    Glucose-Capillary 141 (H) 70 - 99 mg/dL      Comment: Glucose reference range applies only to samples taken after fasting for at least 8 hours.  Glucose, capillary     Status: None    Collection Time: 11/04/22  8:03 AM  Result Value Ref Range    Glucose-Capillary 90 70 - 99 mg/dL      Comment: Glucose reference range applies only to samples taken after fasting for at least 8 hours.  Glucose, capillary     Status: Abnormal    Collection Time: 11/04/22 12:27 PM  Result Value Ref Range    Glucose-Capillary 126 (H) 70 - 99 mg/dL      Comment: Glucose reference range applies only to samples taken after fasting for at least 8 hours.      Imaging Results (Last 48 hours)  No results found.         Blood pressure (!) 158/75, pulse 63, temperature 97.8 F (36.6 C), temperature source Oral, resp. rate 18, height  (1.778 m), weight 76.2 kg, SpO2 100 %.   Medical Problem List and Plan: 1. Functional deficits secondary to lumbar myelopathy              -patient may  shower if cover incisions             -ELOS/Goals: 7-10 mod I   2.  Antithrombotics: -DVT/anticoagulation:  Mechanical:  Antiembolism stockings, knee (TED hose) Bilateral lower extremities             -antiplatelet therapy: none             -  follow up BLE venous duplex   3. Pain Management: Tylenol, baclofen, Robaxin, oxycodone as needed   4. Mood/Behavior/Sleep: LCSW to evaluate and provide emotional support             -antipsychotic agents: n/a   5. Neuropsych/cognition: This patient is capable of making decisions on his own behalf.   6. Skin/Wound Care: Routine skin care checks   7. Fluids/Electrolytes/Nutrition: Routine Is and Os and follow-up chemistries   8: Hypertension: monitor TID and prn             -continue amlodipine 5 mg daily             -continue benazepril 10 mg daily             -continue metroprolol succinate 25 mg daily   9: Hyperlipidemia: continue statin   10: Legally blind- from glaucoma   11: DM-2: CBGs four times daily; carb modified diet; A!c 6.2% on 4/09             -continue metformin 500 mg with supper             -continue SSI   12: Glaucoma: continue gtts X 4   13: History of gout: no reports of flare   14: Urinary hesitancy/frequency   15: Constipation: continue regimen; PRNs ordered               Milinda Antis, PA-C 11/04/2022   I have personally performed a face to face diagnostic evaluation of this patient and formulated the key components of the plan.  Additionally, I have personally reviewed laboratory data, imaging studies, as well as relevant notes and concur with the physician assistant's documentation above.   The patient's status has not changed from the original H&P.  Any changes in documentation from the acute care chart have been noted above.

## 2022-11-04 NOTE — Progress Notes (Signed)
Inpatient Rehabilitation Admission Medication Review by a Pharmacist  A complete drug regimen review was completed for this patient to identify any potential clinically significant medication issues.  High Risk Drug Classes Is patient taking? Indication by Medication  Antipsychotic No   Anticoagulant No   Antibiotic No   Opioid Yes Oxycodone - pain  Antiplatelet No   Hypoglycemics/insulin Yes SSI/metformin - DM  Vasoactive Medication Yes Amlodipine/lotensin/toprol - HTN  Chemotherapy No   Other Yes Trazodone - sleep Protonix - GERD Robaxin/baclofen - spasms Atorvastatin - HLD Alphagan/cosopt/pred forte - cataracts     Type of Medication Issue Identified Description of Issue Recommendation(s)  Drug Interaction(s) (clinically significant)     Duplicate Therapy     Allergy     No Medication Administration End Date     Incorrect Dose     Additional Drug Therapy Needed     Significant med changes from prior encounter (inform family/care partners about these prior to discharge).    Other       Clinically significant medication issues were identified that warrant physician communication and completion of prescribed/recommended actions by midnight of the next day:  No    Time spent performing this drug regimen review (minutes):  30   Brian Daniels BS, PharmD, BCPS Clinical Pharmacist 11/04/2022 5:21 PM  Contact: [phone] after 3 PM  "Be curious, not judgmental..." -Debbora Dus

## 2022-11-04 NOTE — Progress Notes (Signed)
PMR Admission Coordinator Pre-Admission Assessment   Patient: Brian Daniels is an 67 y.o., male MRN: [id number] DOB: December 19, 1955 Height: 5\' 10"  (177.8 cm) Weight: 76.2 kg   Insurance Information HMO: yes    PPO:      PCP:      IPA:      80/20:      OTHER:  PRIMARY: Blue Medicare      Policy#: [id number]      Subscriber: Pt  CM Name: Carollee Herter       Phone#: [phone]     Fax#: [phone] approved 4/23 for 7 days Pre-Cert#: [id number]      Employer:  Benefits:  Phone #: [phone]     Name:  Dolores Hoose Date: 07/14/2022 - 07/13/2198 Deductible: does not have one OOP Max: $3,500 ($83.07 met) CIR: $335/day co-pay for days 1-5 SNF: $0.00 Copayment per day for days 1-20; $203 Copayment per day for days 21-60; $0.00 copayment for days 61-100 Maximum of 100 days/benefit period Outpatient:  $10 copay/visit Home Health:  100% coverage DME: 80% coverage, 20% co-insurance Providers: in network  SECONDARY:       Policy#:      Phone#:      Artist:       Phone#:    The Data processing manager" for patients in Inpatient Rehabilitation Facilities with attached "Privacy Act Statement-Health Care Records" was provided and verbally reviewed with: Patient   Emergency Contact Information Contact Information       Name Relation Home Work Mobile    Brian, Daniels Sister     [phone]           Current Medical History  Patient Admitting Diagnosis: Lumbar Myelopathy History of Present Illness: Brian Daniels is a 67 y.o. male with hx C3 ASIA C/D incomplete quadriplegia with s/p multilevel discectomy C3-4, 4-5, 5-6 and C6-7 with decompression of spinal cord and exiting nerve roots with arthrodesis 08/05/2019 .  The patient developed increasing weakness and paresthesias as well as bilateral leg pain and was seen by neurosurgery.  Imaging studies performed outside of the epic system reportedly showed lumbar spinal stenosis.  He presented for elective L2-3 L3-4 posterior spinal  decompression and fusion by Dr. Conchita Paris on 10/31/2022.  Admitted to [facility] for procedure and post op care. Intraoperative x-rays reviewed.  The patient suffered no postoperative anesthetic complications.  Some increased left hip weakness felt to be due to psoas injury.  Pt. Seen by PT/OT and they recommend CIR to assist return to PLOF.     Patient's medical record from [facility]  has been reviewed by the rehabilitation admission coordinator and physician.   Past Medical History         Past Medical History:  Diagnosis Date   Cataracts, bilateral      removed by surgery   Diabetes mellitus without complication      type 2   Glaucoma      bilateral   Gout     Heart murmur      several years ago   Hypercholesteremia     Hypertension     Left atrial enlargement     Legally blind      bilateral   Neuropathy      hands, arm   Numbness      hands, arms   Wears partial dentures      lower   Wheelchair bound      since 05/30/19  Has the patient had major surgery during 100 days prior to admission? Yes   Family History   family history includes Diabetes in his mother; Hyperlipidemia in his mother; Hypertension in his father.   Current Medications   Current Facility-Administered Medications:    0.9 %  sodium chloride infusion, , Intravenous, Continuous, Lisbeth Renshaw, MD, Stopped at 11/02/22 1217   0.9 %  sodium chloride infusion, 250 mL, Intravenous, Continuous, Lisbeth Renshaw, MD, Stopping previously hung infusion at 10/31/22 1817   acetaminophen (TYLENOL) tablet 650 mg, 650 mg, Oral, Q4H PRN, 650 mg at 11/02/22 2142 **OR** acetaminophen (TYLENOL) suppository 650 mg, 650 mg, Rectal, Q4H PRN, Lisbeth Renshaw, MD   amLODipine (NORVASC) tablet 5 mg, 5 mg, Oral, Daily, Lisbeth Renshaw, MD, 5 mg at 11/03/22 0920   atorvastatin (LIPITOR) tablet 20 mg, 20 mg, Oral, Daily, Lisbeth Renshaw, MD, 20 mg at 11/03/22 0920   baclofen  (LIORESAL) tablet 5 mg, 5 mg, Oral, Daily PRN, Lisbeth Renshaw, MD, 5 mg at 11/02/22 2142   benazepril (LOTENSIN) tablet 10 mg, 10 mg, Oral, Daily, Lisbeth Renshaw, MD, 10 mg at 11/03/22 0947   bisacodyl (DULCOLAX) EC tablet 5-10 mg, 5-10 mg, Oral, Daily PRN, Lisbeth Renshaw, MD, 5 mg at 11/03/22 4401   bisacodyl (DULCOLAX) suppository 10 mg, 10 mg, Rectal, Daily PRN, Lisbeth Renshaw, MD   brimonidine (ALPHAGAN) 0.2 % ophthalmic solution 1 drop, 1 drop, Both Eyes, TID, Lisbeth Renshaw, MD, 1 drop at 11/03/22 1408   docusate sodium (COLACE) capsule 100 mg, 100 mg, Oral, BID, Lisbeth Renshaw, MD, 100 mg at 11/03/22 0272   dorzolamide-timolol (COSOPT) 2-0.5 % ophthalmic solution 1 drop, 1 drop, Left Eye, BID, Lisbeth Renshaw, MD, 1 drop at 11/03/22 0816   insulin aspart (novoLOG) injection 0-15 Units, 0-15 Units, Subcutaneous, TID WC, Nundkumar, Neelesh, MD   menthol-cetylpyridinium (CEPACOL) lozenge 3 mg, 1 lozenge, Oral, PRN **OR** phenol (CHLORASEPTIC) mouth spray 1 spray, 1 spray, Mouth/Throat, PRN, Lisbeth Renshaw, MD   metFORMIN (GLUCOPHAGE-XR) 24 hr tablet 500 mg, 500 mg, Oral, Q supper, Lisbeth Renshaw, MD, 500 mg at 11/02/22 1607   methocarbamol (ROBAXIN) tablet 500 mg, 500 mg, Oral, Q6H PRN, 500 mg at 11/03/22 0921 **OR** methocarbamol (ROBAXIN) 500 mg in dextrose 5 % 50 mL IVPB, 500 mg, Intravenous, Q6H PRN, Lisbeth Renshaw, MD   metoprolol succinate (TOPROL-XL) 24 hr tablet 25 mg, 25 mg, Oral, Daily, Lisbeth Renshaw, MD, 25 mg at 11/03/22 0920   morphine (PF) 2 MG/ML injection 2 mg, 2 mg, Intravenous, Q2H PRN, Lisbeth Renshaw, MD   ondansetron (ZOFRAN) tablet 4 mg, 4 mg, Oral, Q6H PRN **OR** ondansetron (ZOFRAN) injection 4 mg, 4 mg, Intravenous, Q6H PRN, Lisbeth Renshaw, MD   oxyCODONE (Oxy IR/ROXICODONE) immediate release tablet 10 mg, 10 mg, Oral, Q3H PRN, Lisbeth Renshaw, MD, 10 mg at 11/02/22 2142   oxyCODONE (Oxy IR/ROXICODONE) immediate release  tablet 5 mg, 5 mg, Oral, Q3H PRN, Lisbeth Renshaw, MD, 5 mg at 11/03/22 0920   pantoprazole (PROTONIX) EC tablet 40 mg, 40 mg, Oral, Q supper, Ilda Basset, RPH, 40 mg at 11/02/22 1607   polyethylene glycol (MIRALAX / GLYCOLAX) packet 17 g, 17 g, Oral, Daily, Lisbeth Renshaw, MD, 17 g at 11/03/22 0920   prednisoLONE acetate (PRED FORTE) 1 % ophthalmic suspension 1 drop, 1 drop, Right Eye, BID, Lisbeth Renshaw, MD, 1 drop at 11/03/22 0919   senna (SENOKOT) tablet 8.6 mg, 1 tablet, Oral, BID, Lisbeth Renshaw, MD, 8.6 mg at 11/03/22 0920   sodium chloride flush (  NS) 0.9 % injection 3 mL, 3 mL, Intravenous, Q12H, Nundkumar, Neelesh, MD, 3 mL at 11/03/22 0926   sodium chloride flush (NS) 0.9 % injection 3 mL, 3 mL, Intravenous, PRN, Lisbeth Renshaw, MD   timolol (TIMOPTIC) 0.5 % ophthalmic solution 1 drop, 1 drop, Both Eyes, BID, Lisbeth Renshaw, MD, 1 drop at 11/03/22 0815   Patients Current Diet:  Diet Order                  Diet Carb Modified Fluid consistency: Thin; Room service appropriate? Yes  Diet effective now                         Precautions / Restrictions Precautions Precautions: Fall, Back Precaution Booklet Issued: No Precaution Comments: legally bline Spinal Brace: Lumbar corset, Applied in sitting position Restrictions Weight Bearing Restrictions: No    Has the patient had 2 or more falls or a fall with injury in the past year? No   Prior Activity Level Limited Community (1-2x/wk): Pt. went out 1-2x a week   Prior Functional Level Self Care: Did the patient need help bathing, dressing, using the toilet or eating? Independent   Indoor Mobility: Did the patient need assistance with walking from room to room (with or without device)? Independent   Stairs: Did the patient need assistance with internal or external stairs (with or without device)? Needed some help   Functional Cognition: Did the patient need help planning regular tasks such as  shopping or remembering to take medications? Dependent   Patient Information Are you of Hispanic, Latino/a,or Spanish origin?: A. No, not of Hispanic, Latino/a, or Spanish origin What is your race?: B. Black or African American Do you need or want an interpreter to communicate with a doctor or health care staff?: 0. No   Patient's Response To:  Health Literacy and Transportation Is the patient able to respond to health literacy and transportation needs?: Yes Health Literacy - How often do you need to have someone help you when you read instructions, pamphlets, or other written material from your doctor or pharmacy?: Always In the past 12 months, has lack of transportation kept you from medical appointments or from getting medications?: No In the past 12 months, has lack of transportation kept you from meetings, work, or from getting things needed for daily living?: No   Home Assistive Devices / Equipment Home Equipment: Agricultural consultant (2 wheels), Rollator (4 wheels), Shower seat, Wheelchair - manual, Medical laboratory scientific officer - single point   Prior Device Use: Indicate devices/aids used by the patient prior to current illness, exacerbation or injury? Walker   Current Functional Level Cognition   Overall Cognitive Status: Within Functional Limits for tasks assessed Orientation Level: Oriented X4 General Comments: potential STM deficits, could not recall acronym and 2/3 back precautions consistently    Extremity Assessment (includes Sensation/Coordination)   Upper Extremity Assessment: Overall WFL for tasks assessed  Lower Extremity Assessment: Generalized weakness LLE Deficits / Details: Distal strength intact, hip flexion 4-/5 LLE Sensation: WNL     ADLs   Overall ADL's : Needs assistance/impaired Eating/Feeding: Set up, Sitting Grooming: Wash/dry hands, Wash/dry face, Oral care, Min guard, Standing Grooming Details (indicate cue type and reason): cues to use cup to spit in after oral care instead of  bending at waist Upper Body Bathing: Set up, Sitting Lower Body Bathing: Maximal assistance, Sitting/lateral leans, Sit to/from stand, Adhering to back precautions Upper Body Dressing : Set up, Sitting Lower Body  Dressing: Maximal assistance, Sitting/lateral leans, Sit to/from stand, Adhering to back precautions Toilet Transfer: Minimal assistance, Cueing for safety, Cueing for sequencing, Rolling walker (2 wheels) Toilet Transfer Details (indicate cue type and reason): simulated with sit<>stands in room Toileting- Clothing Manipulation and Hygiene: Maximal assistance, Sitting/lateral lean, Sit to/from stand Functional mobility during ADLs: Moderate assistance, Rolling walker (2 wheels) General ADL Comments: Patient session focus on increasing activity tolerance through ADLs in standing and continued rehearsal of back precautions.     Mobility   Overal bed mobility: Needs Assistance Bed Mobility: Sit to Sidelying Rolling: Modified independent (Device/Increase time) Sidelying to sit: Min assist Sit to sidelying: Min assist General bed mobility comments: use of rail and assist for lifting each leg just over edge of bed; assist to reposition trunk once supine     Transfers   Overall transfer level: Needs assistance Equipment used: Rolling walker (2 wheels) Transfers: Sit to/from Stand Sit to Stand: Min assist, Mod assist Bed to/from chair/wheelchair/BSC transfer type:: Step pivot Step pivot transfers: Mod assist General transfer comment: up to stand from recliner with some lifting help and increased time for anterior weight shift, heavily reliant on UE's     Ambulation / Gait / Stairs / Wheelchair Mobility   Ambulation/Gait Ambulation/Gait assistance: Editor, commissioning (Feet): 60 Feet Assistive device: Rolling walker (2 wheels) Gait Pattern/deviations: Step-to pattern, Step-through pattern, Decreased stride length, Wide base of support, Decreased dorsiflexion - left,  Shuffle General Gait Details: flexed posture, cues to straighten, assist for balance esp turning walker in hallway, noted L foot toes dragging some, reports great toe pain worse than back, RN aware     Posture / Balance Dynamic Sitting Balance Sitting balance - Comments: UE support to help himself lean forward from back of chair Balance Overall balance assessment: Needs assistance Sitting-balance support: Bilateral upper extremity supported Sitting balance-Leahy Scale: Poor Sitting balance - Comments: UE support to help himself lean forward from back of chair Postural control: Posterior lean Standing balance support: Bilateral upper extremity supported Standing balance-Leahy Scale: Poor Standing balance comment: reliant on RW     Special needs/care consideration Skin surgical incision  and Special service needs Pt. Legally blind     Previous Home Environment (from acute therapy documentation) Living Arrangements: Other relatives (sister) Available Help at Discharge: Family, Available 24 hours/day Type of Home: House Home Layout: One level Home Access: Stairs to enter [employer] of Steps: 1 (stoop) Bathroom Shower/Tub: Engineer, manufacturing systems: Handicapped height Bathroom Accessibility:  (unsure)   Discharge Living Setting Plans for Discharge Living Setting: Patient's home Type of Home at Discharge: House Discharge Home Layout: One level Discharge Home Access: Stairs to enter Entrance Stairs-Rails: None Entrance Stairs-Number of Steps: 1 Discharge Bathroom Shower/Tub: Tub/shower unit Discharge Bathroom Toilet: Handicapped height Discharge Bathroom Accessibility: Yes How Accessible: Accessible via walker   Social/Family/Support Systems Patient Roles: Other (Comment) Contact Information: Kerron Sedano Anticipated Caregiver: [phone] Anticipated Caregiver's Contact Information: Sister Ability/Limitations of Caregiver: No limitations, 24/7 Caregiver  Availability: 24/7 Discharge Plan Discussed with Primary Caregiver: Yes Is Caregiver In Agreement with Plan?: Yes Does Caregiver/Family have Issues with Lodging/Transportation while Pt is in Rehab?: No   Goals Patient/Family Goal for Rehab: PT/OT Supervision to Mod I Expected length of stay: 7-10 Pt/Family Agrees to Admission and willing to participate: Yes Program Orientation Provided & Reviewed with Pt/Caregiver Including Roles  & Responsibilities: Yes   Decrease burden of Care through IP rehab admission: Diet advancement, Decrease number of caregivers, Bowel and  bladder program, and Patient/family education   Possible need for SNF placement upon discharge: not anticipated      Patient Condition: This patient's condition remains as documented in the consult dated 11/03/22, in which the Rehabilitation Physician determined and documented that the patient's condition is appropriate for intensive rehabilitative care in an inpatient rehabilitation facility. Will admit to inpatient rehab today.   Preadmission Screen Completed By:  Jeronimo Greaves, CCC-SLP, 11/04/2022 2:24 PM ______________________________________________________________________   Discussed status with Dr. Berline Chough on 11/04/22 at 930 and received approval for admission today.   Admission Coordinator:  Jeronimo Greaves, time 1425/Date 11/04/22

## 2022-11-04 NOTE — H&P (Signed)
Physical Medicine and Rehabilitation Admission H&P   CC: Functional deficits secondary to lumbar myelopathy  HPI: Brian Daniels is a 67 year old L handed male who presented for elective lumbar laminectomy on 10/31/2022 due to severe multifactorial stenosis at L2-3, L3-4 with associated chronic bilateral leg pain, paresthesias, and subjective heaviness with difficulty walking. He was taken to the OR on 4/19 and underwent  L2, L3 laminectomy with radical bilateral facetectomy for decompression by Dr. Conchita Paris on 4/20, neurologically at baseline, perhaps slightly worsened left hip flexion weakness likely from psoas injury.  Mod assist with transfers and mobility and max assist with lower body bathing and dressing.  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 History of ACDF 07/2019. Has history of DM-2, hypertension, hyperlipidemia, gout. Tolerating carb modifed diet. Complaining of urinary hesitancy/frequency. Was taking Beta-prostate OTC. Says tamulosin seemed to poorly affect his vision in the past. The patient requires inpatient medicine and rehabilitation evaluations and services for ongoing dysfunction secondary to lumbar myelopathy.   Pt reports legally blind due to glaucoma-  Back hurting- 6/10- but he "expects that"- meds help back pain. Denies muscle spasms- says is getting meds for it so doesn't think has spasms.   LBM yesterday but still feels constipated.  Has condom cath and voiding well- hard to use urinal due to blindness.    Review of Systems  Constitutional:  Negative for chills and fever.  HENT:  Negative for hearing loss and sore throat.   Eyes:        Legally blind  Respiratory:  Negative for cough and shortness of breath.   Cardiovascular:  Negative for chest pain and palpitations.  Gastrointestinal:  Positive for constipation. Negative for nausea and vomiting.  Genitourinary:  Positive for frequency and urgency.  Musculoskeletal:  Positive for back pain.       Post-op pain well  controlled  Neurological:  Negative for dizziness and headaches.  All other systems reviewed and are negative.  Past Medical History:  Diagnosis Date   Cataracts, bilateral    removed by surgery   Diabetes mellitus without complication    type 2   Glaucoma    bilateral   Gout    Heart murmur    several years ago   Hypercholesteremia    Hypertension    Left atrial enlargement    Legally blind    bilateral   Neuropathy    hands, arm   Numbness    hands, arms   Wears partial dentures    lower   Wheelchair bound    since 05/30/19   Past Surgical History:  Procedure Laterality Date   ANTERIOR CERVICAL DECOMPRESSION/DISCECTOMY FUSION 4 LEVELS N/A 08/05/2019   Procedure: ANTERIOR CERVICAL DECOMPRESSION/DISCECTOMY FUSION CERVICAL THREE- CERVICAL FOUR, CERVICAL FOUR- CERVICAL FIVE, CERVICAL FIVE- CERVICAL SIX, CERVICAL SIX- CERVICAL SEVEN;  Surgeon: Lisbeth Renshaw, MD;  Location: [facility] OR;  Service: Neurosurgery;  Laterality: N/A;  anterior   COLONOSCOPY     EYE SURGERY Bilateral    multiple surgeries bilateral -    MULTIPLE TOOTH EXTRACTIONS     WISDOM TOOTH EXTRACTION     Family History  Problem Relation Age of Onset   Hyperlipidemia Mother    Diabetes Mother    Hypertension Father    Social History:  reports that he has never smoked. He has never used smokeless tobacco. He reports that he does not currently use alcohol. He reports that he does not use drugs. Allergies: No Known Allergies Medications Prior to Admission  Medication Sig Dispense Refill   amLODipine (NORVASC) 5 MG tablet Take 5 mg by mouth daily.     Ascorbic Acid (VITAMIN C) 1000 MG tablet Take 1,000 mg by mouth daily.     aspirin EC 81 MG tablet Take 1 tablet (81 mg total) by mouth daily.     atorvastatin (LIPITOR) 20 MG tablet Take 1 tablet (20 mg total) by mouth daily. 30 tablet 1   benazepril (LOTENSIN) 10 MG tablet Take 1 tablet (10 mg total) by mouth daily. 30 tablet 1   BL CALCIUM-MAGNESIUM-ZINC PO  Take 1 tablet by mouth daily.     brimonidine (ALPHAGAN) 0.2 % ophthalmic solution Place 1 drop into both eyes 3 (three) times daily.     Cyanocobalamin (B-12) 5000 MCG CAPS Take 5,000 mcg by mouth daily.     dorzolamide-timolol (COSOPT) 2-0.5 % ophthalmic solution Place 1 drop into the left eye 2 (two) times daily.     GINSENG PO Take 2 tablets by mouth daily as needed (energy boost).     lidocaine (LIDODERM) 5 % Place 2 patches onto the skin daily. Remove & Discard patch within 12 hours or as directed by MD (Patient taking differently: Place 2 patches onto the skin daily as needed (pain). Remove & Discard patch within 12 hours or as directed by MD) 60 patch 0   meloxicam (MOBIC) 15 MG tablet Take 15 mg by mouth daily.     metFORMIN (GLUCOPHAGE-XR) 500 MG 24 hr tablet Take 1 tablet (500 mg total) by mouth at bedtime. 30 tablet 1   metoprolol succinate (TOPROL-XL) 25 MG 24 hr tablet Take 25 mg by mouth daily.     Omega-3 Fatty Acids (FISH OIL) 1000 MG CAPS Take 1,000 mg by mouth at bedtime.     OVER THE COUNTER MEDICATION Take 1 capsule by mouth daily. Super Beta Prostate     prednisoLONE acetate (PRED FORTE) 1 % ophthalmic suspension Place 1 drop into the right eye 2 (two) times daily.     sodium chloride (MURO 128) 5 % ophthalmic solution Place 1 drop into both eyes as needed for eye irritation.     timolol (TIMOPTIC) 0.5 % ophthalmic solution Place 1 drop into both eyes 2 (two) times daily.     vitamin E 180 MG (400 UNITS) capsule Take 180 mg by mouth daily.      acetaminophen (TYLENOL) 325 MG tablet Take 2 tablets (650 mg total) by mouth every 4 (four) hours as needed for mild pain ((score 1 to 3) or temp > 100.5).     baclofen (LIORESAL) 10 MG tablet Take 0.5 tablets (5 mg total) by mouth 3 (three) times daily. (Patient taking differently: Take 5 mg by mouth daily as needed for muscle spasms.) 150 each 3   bisacodyl (DULCOLAX) 10 MG suppository Place 1 suppository (10 mg total) rectally daily  as needed for moderate constipation. 12 suppository 0   diclofenac Sodium (VOLTAREN) 1 % GEL Apply 2 g topically 4 (four) times daily as needed (pain). (Patient not taking: Reported on 10/17/2022) 2 g 1   DULoxetine (CYMBALTA) 60 MG capsule Take 1 capsule (60 mg total) by mouth at bedtime. (Patient not taking: Reported on 10/17/2022) 30 capsule 3   enoxaparin (LOVENOX) 40 MG/0.4ML injection lovenox 40 mg daily until 10/08/2019 and stop (Patient not taking: Reported on 10/17/2022) 0.4 mL 2   enoxaparin (LOVENOX) 40 MG/0.4ML injection Inject 0.4 mLs (40 mg total) into the skin daily. Quantity 30 syringes (Patient not  taking: Reported on 10/17/2022) 30 mL 0   HYDROcodone-acetaminophen (NORCO/VICODIN) 5-325 MG tablet Take 1 tablet by mouth every 4 (four) hours as needed for moderate pain ((score 4 to 6)). (Patient not taking: Reported on 10/17/2022) 30 tablet 0   methocarbamol (ROBAXIN) 500 MG tablet Take 1 tablet (500 mg total) by mouth every 6 (six) hours as needed for muscle spasms (L shoulder pain/muscle tightness). (Patient not taking: Reported on 10/17/2022) 100 tablet 5   senna (SENOKOT) 8.6 MG TABS tablet Take 2 tablets (17.2 mg total) by mouth 2 (two) times daily. (Patient not taking: Reported on 10/17/2022) 120 tablet 0   tamsulosin (FLOMAX) 0.4 MG CAPS capsule TAKE 1 CAPSULE(0.4 MG) BY MOUTH DAILY AFTER SUPPER (Patient not taking: Reported on 10/17/2022) 90 capsule 1      Home: Home Living Family/patient expects to be discharged to:: Private residence Living Arrangements: Other relatives (sister) Available Help at Discharge: Family, Available 24 hours/day Type of Home: House Home Access: Stairs to enter [employer] of Steps: 1 (stoop) Home Layout: One level Bathroom Shower/Tub: Engineer, manufacturing systems: Handicapped height Bathroom Accessibility:  (unsure) Home Equipment: Agricultural consultant (2 wheels), Rollator (4 wheels), Shower seat, Wheelchair - manual, [employer] - single point    Functional History: Prior Function Prior Level of Function : Independent/Modified Independent Mobility Comments: independent, use of rollator  and RW depending on where he was travelling out of the house .No use in house. ADLs Comments: ind  Functional Status:  Mobility: Bed Mobility Overal bed mobility: Needs Assistance Bed Mobility: Sit to Sidelying Rolling: Modified independent (Device/Increase time) Sidelying to sit: Min assist Sit to sidelying: Min assist General bed mobility comments: use of rail and assist for lifting each leg just over edge of bed; assist to reposition trunk once supine Transfers Overall transfer level: Needs assistance Equipment used: Rolling walker (2 wheels) Transfers: Sit to/from Stand Sit to Stand: Min assist, Mod assist Bed to/from chair/wheelchair/BSC transfer type:: Step pivot Step pivot transfers: Mod assist General transfer comment: up to stand from recliner with some lifting help and increased time for anterior weight shift, heavily reliant on UE's Ambulation/Gait Ambulation/Gait assistance: Min assist Gait Distance (Feet): 60 Feet Assistive device: Rolling walker (2 wheels) Gait Pattern/deviations: Step-to pattern, Step-through pattern, Decreased stride length, Wide base of support, Decreased dorsiflexion - left, Shuffle General Gait Details: flexed posture, cues to straighten, assist for balance esp turning walker in hallway, noted L foot toes dragging some, reports great toe pain worse than back, RN aware    ADL: ADL Overall ADL's : Needs assistance/impaired Eating/Feeding: Set up, Sitting Grooming: Wash/dry hands, Wash/dry face, Oral care, Min guard, Standing Grooming Details (indicate cue type and reason): cues to use cup to spit in after oral care instead of bending at waist Upper Body Bathing: Set up, Sitting Lower Body Bathing: Maximal assistance, Sitting/lateral leans, Sit to/from stand, Adhering to back precautions Upper Body  Dressing : Set up, Sitting Lower Body Dressing: Maximal assistance, Sitting/lateral leans, Sit to/from stand, Adhering to back precautions Toilet Transfer: Minimal assistance, Cueing for safety, Cueing for sequencing, Rolling walker (2 wheels) Toilet Transfer Details (indicate cue type and reason): simulated with sit<>stands in room Toileting- Clothing Manipulation and Hygiene: Maximal assistance, Sitting/lateral lean, Sit to/from stand Functional mobility during ADLs: Moderate assistance, Rolling walker (2 wheels) General ADL Comments: Patient session focus on increasing activity tolerance through ADLs in standing and continued rehearsal of back precautions.  Cognition: Cognition Overall Cognitive Status: Within Functional Limits for tasks assessed Orientation  Level: Oriented X4 Cognition Arousal/Alertness: Awake/alert Behavior During Therapy: WFL for tasks assessed/performed Overall Cognitive Status: Within Functional Limits for tasks assessed General Comments: potential STM deficits, could not recall acronym and 2/3 back precautions consistently  Physical Exam: Blood pressure (!) 158/75, pulse 63, temperature 97.8 F (36.6 C), temperature source Oral, resp. rate 18, height 5\' 10"  (1.778 m), weight 76.2 kg, SpO2 100 %. Physical Exam Vitals and nursing note reviewed.  Constitutional:      General: He is not in acute distress.    Appearance: Normal appearance. He is normal weight. He is not ill-appearing.     Comments: Pt sitting in bedside chair; but transferred with NT to bed with RW, NAD  HENT:     Head: Normocephalic and atraumatic.     Right Ear: External ear normal.     Left Ear: External ear normal.     Nose: Nose normal. No congestion.     Mouth/Throat:     Mouth: Mucous membranes are dry.     Pharynx: Oropharynx is clear. No oropharyngeal exudate.  Eyes:     General:        Right eye: No discharge.        Left eye: No discharge.     Comments: B/L eyes appear whitish  from glaucoma  Cardiovascular:     Rate and Rhythm: Normal rate and regular rhythm.     Heart sounds: Normal heart sounds. No murmur heard.    No gallop.  Pulmonary:     Effort: Pulmonary effort is normal. No respiratory distress.     Breath sounds: Normal breath sounds. No wheezing, rhonchi or rales.  Abdominal:     General: There is distension.     Palpations: Abdomen is soft.     Tenderness: There is no abdominal tenderness.     Comments: Slightly hypoactive  Genitourinary:    Comments: Condom cath in place- medium amber urine in bag Musculoskeletal:     Cervical back: Neck supple. No tenderness.     Right lower leg: No edema.     Left lower leg: No edema.     Comments: Ue's 5/5 B/L RLE- 5-/5 except HF on R 4+/5 LLE- 5-5/5 except L HF 4+/5  Skin:    General: Skin is warm and dry.     Comments: PIV in dorsum of each hand without erythema Lumbar incision and L groin/abd incision with honeycomb dressing in place- look OK  Neurological:     Mental Status: He is alert and oriented to person, place, and time.     Comments: Intact to light touch in Ue's and torso L1 numbness and tingling L>R; toherwise intact to light touch in all 4 extremities  Psychiatric:        Mood and Affect: Mood normal.        Behavior: Behavior normal.     Results for orders placed or performed during the hospital encounter of 10/31/22 (from the past 48 hour(s))  Glucose, capillary     Status: Abnormal   Collection Time: 11/02/22  4:44 PM  Result Value Ref Range   Glucose-Capillary 156 (H) 70 - 99 mg/dL    Comment: Glucose reference range applies only to samples taken after fasting for at least 8 hours.  Glucose, capillary     Status: Abnormal   Collection Time: 11/02/22  9:49 PM  Result Value Ref Range   Glucose-Capillary 101 (H) 70 - 99 mg/dL    Comment: Glucose reference range applies only  to samples taken after fasting for at least 8 hours.  Glucose, capillary     Status: None   Collection  Time: 11/03/22  7:59 AM  Result Value Ref Range   Glucose-Capillary 99 70 - 99 mg/dL    Comment: Glucose reference range applies only to samples taken after fasting for at least 8 hours.  Glucose, capillary     Status: Abnormal   Collection Time: 11/03/22 11:20 AM  Result Value Ref Range   Glucose-Capillary 112 (H) 70 - 99 mg/dL    Comment: Glucose reference range applies only to samples taken after fasting for at least 8 hours.  Glucose, capillary     Status: Abnormal   Collection Time: 11/03/22  5:14 PM  Result Value Ref Range   Glucose-Capillary 148 (H) 70 - 99 mg/dL    Comment: Glucose reference range applies only to samples taken after fasting for at least 8 hours.  Glucose, capillary     Status: Abnormal   Collection Time: 11/03/22  8:28 PM  Result Value Ref Range   Glucose-Capillary 141 (H) 70 - 99 mg/dL    Comment: Glucose reference range applies only to samples taken after fasting for at least 8 hours.  Glucose, capillary     Status: None   Collection Time: 11/04/22  8:03 AM  Result Value Ref Range   Glucose-Capillary 90 70 - 99 mg/dL    Comment: Glucose reference range applies only to samples taken after fasting for at least 8 hours.  Glucose, capillary     Status: Abnormal   Collection Time: 11/04/22 12:27 PM  Result Value Ref Range   Glucose-Capillary 126 (H) 70 - 99 mg/dL    Comment: Glucose reference range applies only to samples taken after fasting for at least 8 hours.   No results found.    Blood pressure (!) 158/75, pulse 63, temperature 97.8 F (36.6 C), temperature source Oral, resp. rate 18, height 5\' 10"  (1.778 m), weight 76.2 kg, SpO2 100 %.  Medical Problem List and Plan: 1. Functional deficits secondary to lumbar myelopathy  -patient may  shower if cover incisions  -ELOS/Goals: 7-10 mod I  2.  Antithrombotics: -DVT/anticoagulation:  Mechanical:  Antiembolism stockings, knee (TED hose) Bilateral lower extremities  -antiplatelet therapy:  none  -follow up BLE venous duplex  3. Pain Management: Tylenol, baclofen, Robaxin, oxycodone as needed  4. Mood/Behavior/Sleep: LCSW to evaluate and provide emotional support  -antipsychotic agents: n/a  5. Neuropsych/cognition: This patient is capable of making decisions on his own behalf.  6. Skin/Wound Care: Routine skin care checks   7. Fluids/Electrolytes/Nutrition: Routine Is and Os and follow-up chemistries  8: Hypertension: monitor TID and prn  -continue amlodipine 5 mg daily  -continue benazepril 10 mg daily  -continue metroprolol succinate 25 mg daily  9: Hyperlipidemia: continue statin  10: Legally blind- from glaucoma  11: DM-2: CBGs four times daily; carb modified diet; A!c 6.2% on 4/09  -continue metformin 500 mg with supper  -continue SSI  12: Glaucoma: continue gtts X 4  13: History of gout: no reports of flare  14: Urinary hesitancy/frequency  15: Constipation: continue regimen; PRNs ordered        Milinda Antis, PA-C 11/04/2022  I have personally performed a face to face diagnostic evaluation of this patient and formulated the key components of the plan.  Additionally, I have personally reviewed laboratory data, imaging studies, as well as relevant notes and concur with the physician assistant's documentation above.  The patient's status has not changed from the original H&P.  Any changes in documentation from the acute care chart have been noted above.

## 2022-11-04 NOTE — Progress Notes (Signed)
Patient ID: Brian Daniels, male   DOB: 11/17/1955, 67 y.o.   MRN: [id number]  INPATIENT REHABILITATION ADMISSION NOTE   Arrival Method: bed     Mental Orientation: x4   Assessment: completed   Skin: 2 blisters at left flank area (Pictures in chart)   IV'S: n/a   Pain: 5/10, medicated PTA   Tubes and Drains: n/a   Safety Measures: in place   Vital Signs: see flowsheet   Height and Weight: see flowsheet   Rehab Orientation: completed   Family: Sister at bedside

## 2022-11-04 NOTE — Discharge Summary (Signed)
Physician Discharge Summary  Patient ID: Brian Daniels MRN: [id number] DOB/AGE: 68-Aug-1957 68 y.o.  Admit date: 10/31/2022 Discharge date: 11/04/2022  Admission Diagnoses:  Lumbar spinal stenosis  Discharge Diagnoses:  Same Principal Problem:   Lumbar spinal stenosis   Discharged Condition: Stable  Hospital Course:  [name] is a 67 y.o. male brought in electively for L2 to L4 decompression and fusion. He had an uneventful hospital of course, was seen by physical and occupational therapy and found to be a good candidate for comprehensive and patient rehabilitation. He was there for discharged in Stable condition.  Treatments: Surgery - L2-L4 transpsoas interbody fusion, L2-4 laminectomy and posterior instrumentation  Discharge Exam: Blood pressure (!) 158/75, pulse 63, temperature 97.8 F (36.6 C), temperature source Oral, resp. rate 18, height  (1.778 m), weight 76.2 kg, SpO2 100 %. Awake, alert, oriented Speech fluent, appropriate CN grossly intact 5/5 BUE/BLE Wound c/d/i  Disposition:   Discharge Instructions     Incentive spirometry RT   Complete by: As directed       Allergies as of 11/04/2022   No Known Allergies      Medication List     ASK your doctor about these medications    acetaminophen 325 MG tablet Commonly known as: TYLENOL Take 2 tablets (650 mg total) by mouth every 4 (four) hours as needed for mild pain ((score 1 to 3) or temp > 100.5).   amLODipine 5 MG tablet Commonly known as: NORVASC Take 5 mg by mouth daily.   aspirin EC 81 MG tablet Take 1 tablet (81 mg total) by mouth daily.   atorvastatin 20 MG tablet Commonly known as: LIPITOR Take 1 tablet (20 mg total) by mouth daily.   B-12 5000 MCG Caps Take 5,000 mcg by mouth daily.   baclofen 10 MG tablet Commonly known as: LIORESAL Take 0.5 tablets (5 mg total) by mouth 3 (three) times daily.   benazepril 10 MG tablet Commonly known as: LOTENSIN Take 1 tablet  (10 mg total) by mouth daily.   bisacodyl 10 MG suppository Commonly known as: DULCOLAX Place 1 suppository (10 mg total) rectally daily as needed for moderate constipation.   BL CALCIUM-MAGNESIUM-ZINC PO Take 1 tablet by mouth daily.   brimonidine 0.2 % ophthalmic solution Commonly known as: ALPHAGAN Place 1 drop into both eyes 3 (three) times daily.   diclofenac Sodium 1 % Gel Commonly known as: VOLTAREN Apply 2 g topically 4 (four) times daily as needed (pain).   dorzolamide-timolol 2-0.5 % ophthalmic solution Commonly known as: COSOPT Place 1 drop into the left eye 2 (two) times daily.   DULoxetine 60 MG capsule Commonly known as: CYMBALTA Take 1 capsule (60 mg total) by mouth at bedtime.   enoxaparin 40 MG/0.4ML injection Commonly known as: LOVENOX lovenox 40 mg daily until 10/08/2019 and stop   enoxaparin 40 MG/0.4ML injection Commonly known as: Lovenox Inject 0.4 mLs (40 mg total) into the skin daily. Quantity 30 syringes   Fish Oil 1000 MG Caps Take 1,000 mg by mouth at bedtime.   GINSENG PO Take 2 tablets by mouth daily as needed (energy boost).   HYDROcodone-acetaminophen 5-325 MG tablet Commonly known as: NORCO/VICODIN Take 1 tablet by mouth every 4 (four) hours as needed for moderate pain ((score 4 to 6)).   lidocaine 5 % Commonly known as: LIDODERM Place 2 patches onto the skin daily. Remove & Discard patch within 12 hours or as directed by MD   meloxicam 15 MG  tablet Commonly known as: MOBIC Take 15 mg by mouth daily.   metFORMIN 500 MG 24 hr tablet Commonly known as: GLUCOPHAGE-XR Take 1 tablet (500 mg total) by mouth at bedtime.   methocarbamol 500 MG tablet Commonly known as: ROBAXIN Take 1 tablet (500 mg total) by mouth every 6 (six) hours as needed for muscle spasms (L shoulder pain/muscle tightness).   metoprolol succinate 25 MG 24 hr tablet Commonly known as: TOPROL-XL Take 25 mg by mouth daily.   OVER THE COUNTER MEDICATION Take 1  capsule by mouth daily. Super Beta Prostate   prednisoLONE acetate 1 % ophthalmic suspension Commonly known as: PRED FORTE Place 1 drop into the right eye 2 (two) times daily.   senna 8.6 MG Tabs tablet Commonly known as: SENOKOT Take 2 tablets (17.2 mg total) by mouth 2 (two) times daily.   sodium chloride 5 % ophthalmic solution Commonly known as: MURO 128 Place 1 drop into both eyes as needed for eye irritation.   tamsulosin 0.4 MG Caps capsule Commonly known as: FLOMAX TAKE 1 CAPSULE(0.4 MG) BY MOUTH DAILY AFTER SUPPER   timolol 0.5 % ophthalmic solution Commonly known as: TIMOPTIC Place 1 drop into both eyes 2 (two) times daily.   vitamin C 1000 MG tablet Take 1,000 mg by mouth daily.   vitamin E 180 MG (400 UNITS) capsule Take 180 mg by mouth daily.         SignedJackelyn Hoehn 11/04/2022, 5:42 PM

## 2022-11-04 NOTE — PMR Pre-admission (Signed)
PMR Admission Coordinator Pre-Admission Assessment   Patient: Brian Daniels is an 67 y.o., male MRN: [id number] DOB: Jun 15, 1956 Height: 5\' 10"  (177.8 cm) Weight: 76.2 kg   Insurance Information HMO: yes    PPO:      PCP:      IPA:      80/20:      OTHER:  PRIMARY: Blue Medicare      Policy#: [id number]      Subscriber: Pt  CM Name: Carollee Herter       Phone#: [phone]     Fax#: [phone] approved 4/23 for 7 days Pre-Cert#: [id number]      Employer:  Benefits:  Phone #: [phone]     Name:  Dolores Hoose Date: 07/14/2022 - 07/13/2198 Deductible: does not have one OOP Max: $3,500 ($83.07 met) CIR: $335/day co-pay for days 1-5 SNF: $0.00 Copayment per day for days 1-20; $203 Copayment per day for days 21-60; $0.00 copayment for days 61-100 Maximum of 100 days/benefit period Outpatient:  $10 copay/visit Home Health:  100% coverage DME: 80% coverage, 20% co-insurance Providers: in network  SECONDARY:       Policy#:      Phone#:      Artist:       Phone#:    The Data processing manager" for patients in Inpatient Rehabilitation Facilities with attached "Privacy Act Statement-Health Care Records" was provided and verbally reviewed with: Patient   Emergency Contact Information Contact Information       Name Relation Home Work Mobile    Brian Daniels, [name] Sister     [phone]           Current Medical History  Patient Admitting Diagnosis: Lumbar Myelopathy History of Present Illness: Brian Daniels is a 67 y.o. male with hx C3 ASIA C/D incomplete quadriplegia with s/p multilevel discectomy C3-4, 4-5, 5-6 and C6-7 with decompression of spinal cord and exiting nerve roots with arthrodesis 08/05/2019 .  The patient developed increasing weakness and paresthesias as well as bilateral leg pain and was seen by neurosurgery.  Imaging studies performed outside of the epic system reportedly showed lumbar spinal stenosis.  He presented for elective L2-3 L3-4 posterior spinal  decompression and fusion by Dr. Conchita Paris on 10/31/2022.  Admitted to [facility] for procedure and post op care. Intraoperative x-rays reviewed.  The patient suffered no postoperative anesthetic complications.  Some increased left hip weakness felt to be due to psoas injury.  Pt. Seen by PT/OT and they recommend CIR to assist return to PLOF.     Patient's medical record from [facility]  has been reviewed by the rehabilitation admission coordinator and physician.   Past Medical History      Past Medical History:  Diagnosis Date   Cataracts, bilateral      removed by surgery   Diabetes mellitus without complication      type 2   Glaucoma      bilateral   Gout     Heart murmur      several years ago   Hypercholesteremia     Hypertension     Left atrial enlargement     Legally blind      bilateral   Neuropathy      hands, arm   Numbness      hands, arms   Wears partial dentures      lower   Wheelchair bound      since 05/30/19      Has the patient  had major surgery during 100 days prior to admission? Yes   Family History   family history includes Diabetes in his mother; Hyperlipidemia in his mother; Hypertension in his father.   Current Medications   Current Facility-Administered Medications:    0.9 %  sodium chloride infusion, , Intravenous, Continuous, Lisbeth Renshaw, MD, Stopped at 11/02/22 1217   0.9 %  sodium chloride infusion, 250 mL, Intravenous, Continuous, Lisbeth Renshaw, MD, Stopping previously hung infusion at 10/31/22 1817   acetaminophen (TYLENOL) tablet 650 mg, 650 mg, Oral, Q4H PRN, 650 mg at 11/02/22 2142 **OR** acetaminophen (TYLENOL) suppository 650 mg, 650 mg, Rectal, Q4H PRN, Lisbeth Renshaw, MD   amLODipine (NORVASC) tablet 5 mg, 5 mg, Oral, Daily, Lisbeth Renshaw, MD, 5 mg at 11/03/22 0920   atorvastatin (LIPITOR) tablet 20 mg, 20 mg, Oral, Daily, Lisbeth Renshaw, MD, 20 mg at 11/03/22 0920   baclofen (LIORESAL)  tablet 5 mg, 5 mg, Oral, Daily PRN, Lisbeth Renshaw, MD, 5 mg at 11/02/22 2142   benazepril (LOTENSIN) tablet 10 mg, 10 mg, Oral, Daily, Lisbeth Renshaw, MD, 10 mg at 11/03/22 0947   bisacodyl (DULCOLAX) EC tablet 5-10 mg, 5-10 mg, Oral, Daily PRN, Lisbeth Renshaw, MD, 5 mg at 11/03/22 4782   bisacodyl (DULCOLAX) suppository 10 mg, 10 mg, Rectal, Daily PRN, Lisbeth Renshaw, MD   brimonidine (ALPHAGAN) 0.2 % ophthalmic solution 1 drop, 1 drop, Both Eyes, TID, Lisbeth Renshaw, MD, 1 drop at 11/03/22 1408   docusate sodium (COLACE) capsule 100 mg, 100 mg, Oral, BID, Lisbeth Renshaw, MD, 100 mg at 11/03/22 9562   dorzolamide-timolol (COSOPT) 2-0.5 % ophthalmic solution 1 drop, 1 drop, Left Eye, BID, Lisbeth Renshaw, MD, 1 drop at 11/03/22 0816   insulin aspart (novoLOG) injection 0-15 Units, 0-15 Units, Subcutaneous, TID WC, Nundkumar, Neelesh, MD   menthol-cetylpyridinium (CEPACOL) lozenge 3 mg, 1 lozenge, Oral, PRN **OR** phenol (CHLORASEPTIC) mouth spray 1 spray, 1 spray, Mouth/Throat, PRN, Lisbeth Renshaw, MD   metFORMIN (GLUCOPHAGE-XR) 24 hr tablet 500 mg, 500 mg, Oral, Q supper, Lisbeth Renshaw, MD, 500 mg at 11/02/22 1607   methocarbamol (ROBAXIN) tablet 500 mg, 500 mg, Oral, Q6H PRN, 500 mg at 11/03/22 0921 **OR** methocarbamol (ROBAXIN) 500 mg in dextrose 5 % 50 mL IVPB, 500 mg, Intravenous, Q6H PRN, Lisbeth Renshaw, MD   metoprolol succinate (TOPROL-XL) 24 hr tablet 25 mg, 25 mg, Oral, Daily, Lisbeth Renshaw, MD, 25 mg at 11/03/22 0920   morphine (PF) 2 MG/ML injection 2 mg, 2 mg, Intravenous, Q2H PRN, Lisbeth Renshaw, MD   ondansetron (ZOFRAN) tablet 4 mg, 4 mg, Oral, Q6H PRN **OR** ondansetron (ZOFRAN) injection 4 mg, 4 mg, Intravenous, Q6H PRN, Lisbeth Renshaw, MD   oxyCODONE (Oxy IR/ROXICODONE) immediate release tablet 10 mg, 10 mg, Oral, Q3H PRN, Lisbeth Renshaw, MD, 10 mg at 11/02/22 2142   oxyCODONE (Oxy IR/ROXICODONE) immediate release tablet 5  mg, 5 mg, Oral, Q3H PRN, Lisbeth Renshaw, MD, 5 mg at 11/03/22 0920   pantoprazole (PROTONIX) EC tablet 40 mg, 40 mg, Oral, Q supper, Ilda Basset, RPH, 40 mg at 11/02/22 1607   polyethylene glycol (MIRALAX / GLYCOLAX) packet 17 g, 17 g, Oral, Daily, Lisbeth Renshaw, MD, 17 g at 11/03/22 0920   prednisoLONE acetate (PRED FORTE) 1 % ophthalmic suspension 1 drop, 1 drop, Right Eye, BID, Lisbeth Renshaw, MD, 1 drop at 11/03/22 0919   senna (SENOKOT) tablet 8.6 mg, 1 tablet, Oral, BID, Lisbeth Renshaw, MD, 8.6 mg at 11/03/22 0920   sodium chloride flush (NS) 0.9 %  injection 3 mL, 3 mL, Intravenous, Q12H, Nundkumar, Marlane Hatcher, MD, 3 mL at 11/03/22 0926   sodium chloride flush (NS) 0.9 % injection 3 mL, 3 mL, Intravenous, PRN, Lisbeth Renshaw, MD   timolol (TIMOPTIC) 0.5 % ophthalmic solution 1 drop, 1 drop, Both Eyes, BID, Lisbeth Renshaw, MD, 1 drop at 11/03/22 0815   Patients Current Diet:  Diet Order                  Diet Carb Modified Fluid consistency: Thin; Room service appropriate? Yes  Diet effective now                         Precautions / Restrictions Precautions Precautions: Fall, Back Precaution Booklet Issued: No Precaution Comments: legally bline Spinal Brace: Lumbar corset, Applied in sitting position Restrictions Weight Bearing Restrictions: No    Has the patient had 2 or more falls or a fall with injury in the past year? No   Prior Activity Level Limited Community (1-2x/wk): Pt. went out 1-2x a week   Prior Functional Level Self Care: Did the patient need help bathing, dressing, using the toilet or eating? Independent   Indoor Mobility: Did the patient need assistance with walking from room to room (with or without device)? Independent   Stairs: Did the patient need assistance with internal or external stairs (with or without device)? Needed some help   Functional Cognition: Did the patient need help planning regular tasks such as shopping  or remembering to take medications? Dependent   Patient Information Are you of Hispanic, Latino/a,or Spanish origin?: A. No, not of Hispanic, Latino/a, or Spanish origin What is your race?: B. Black or African American Do you need or want an interpreter to communicate with a doctor or health care staff?: 0. No   Patient's Response To:  Health Literacy and Transportation Is the patient able to respond to health literacy and transportation needs?: Yes Health Literacy - How often do you need to have someone help you when you read instructions, pamphlets, or other written material from your doctor or pharmacy?: Always In the past 12 months, has lack of transportation kept you from medical appointments or from getting medications?: No In the past 12 months, has lack of transportation kept you from meetings, work, or from getting things needed for daily living?: No   Home Assistive Devices / Equipment Home Equipment: Agricultural consultant (2 wheels), Rollator (4 wheels), Shower seat, Wheelchair - manual, Medical laboratory scientific officer - single point   Prior Device Use: Indicate devices/aids used by the patient prior to current illness, exacerbation or injury? Walker   Current Functional Level Cognition   Overall Cognitive Status: Within Functional Limits for tasks assessed Orientation Level: Oriented X4 General Comments: potential STM deficits, could not recall acronym and 2/3 back precautions consistently    Extremity Assessment (includes Sensation/Coordination)   Upper Extremity Assessment: Overall WFL for tasks assessed  Lower Extremity Assessment: Generalized weakness LLE Deficits / Details: Distal strength intact, hip flexion 4-/5 LLE Sensation: WNL     ADLs   Overall ADL's : Needs assistance/impaired Eating/Feeding: Set up, Sitting Grooming: Wash/dry hands, Wash/dry face, Oral care, Min guard, Standing Grooming Details (indicate cue type and reason): cues to use cup to spit in after oral care instead of bending  at waist Upper Body Bathing: Set up, Sitting Lower Body Bathing: Maximal assistance, Sitting/lateral leans, Sit to/from stand, Adhering to back precautions Upper Body Dressing : Set up, Sitting Lower Body Dressing: Maximal assistance,  Sitting/lateral leans, Sit to/from stand, Adhering to back precautions Toilet Transfer: Minimal assistance, Cueing for safety, Cueing for sequencing, Rolling walker (2 wheels) Toilet Transfer Details (indicate cue type and reason): simulated with sit<>stands in room Toileting- Clothing Manipulation and Hygiene: Maximal assistance, Sitting/lateral lean, Sit to/from stand Functional mobility during ADLs: Moderate assistance, Rolling walker (2 wheels) General ADL Comments: Patient session focus on increasing activity tolerance through ADLs in standing and continued rehearsal of back precautions.     Mobility   Overal bed mobility: Needs Assistance Bed Mobility: Sit to Sidelying Rolling: Modified independent (Device/Increase time) Sidelying to sit: Min assist Sit to sidelying: Min assist General bed mobility comments: use of rail and assist for lifting each leg just over edge of bed; assist to reposition trunk once supine     Transfers   Overall transfer level: Needs assistance Equipment used: Rolling walker (2 wheels) Transfers: Sit to/from Stand Sit to Stand: Min assist, Mod assist Bed to/from chair/wheelchair/BSC transfer type:: Step pivot Step pivot transfers: Mod assist General transfer comment: up to stand from recliner with some lifting help and increased time for anterior weight shift, heavily reliant on UE's     Ambulation / Gait / Stairs / Wheelchair Mobility   Ambulation/Gait Ambulation/Gait assistance: Editor, commissioning (Feet): 60 Feet Assistive device: Rolling walker (2 wheels) Gait Pattern/deviations: Step-to pattern, Step-through pattern, Decreased stride length, Wide base of support, Decreased dorsiflexion - left, Shuffle General  Gait Details: flexed posture, cues to straighten, assist for balance esp turning walker in hallway, noted L foot toes dragging some, reports great toe pain worse than back, RN aware     Posture / Balance Dynamic Sitting Balance Sitting balance - Comments: UE support to help himself lean forward from back of chair Balance Overall balance assessment: Needs assistance Sitting-balance support: Bilateral upper extremity supported Sitting balance-Leahy Scale: Poor Sitting balance - Comments: UE support to help himself lean forward from back of chair Postural control: Posterior lean Standing balance support: Bilateral upper extremity supported Standing balance-Leahy Scale: Poor Standing balance comment: reliant on RW     Special needs/care consideration Skin surgical incision  and Special service needs Pt. Legally blind     Previous Home Environment (from acute therapy documentation) Living Arrangements: Other relatives (sister) Available Help at Discharge: Family, Available 24 hours/day Type of Home: House Home Layout: One level Home Access: Stairs to enter [employer] of Steps: 1 (stoop) Bathroom Shower/Tub: Engineer, manufacturing systems: Handicapped height Bathroom Accessibility:  (unsure)   Discharge Living Setting Plans for Discharge Living Setting: Patient's home Type of Home at Discharge: House Discharge Home Layout: One level Discharge Home Access: Stairs to enter Entrance Stairs-Rails: None Entrance Stairs-Number of Steps: 1 Discharge Bathroom Shower/Tub: Tub/shower unit Discharge Bathroom Toilet: Handicapped height Discharge Bathroom Accessibility: Yes How Accessible: Accessible via walker   Social/Family/Support Systems Patient Roles: Other (Comment) Contact Information: Bryan Goin Anticipated Caregiver: [phone] Anticipated Caregiver's Contact Information: Sister Ability/Limitations of Caregiver: No limitations, 24/7 Caregiver Availability:  24/7 Discharge Plan Discussed with Primary Caregiver: Yes Is Caregiver In Agreement with Plan?: Yes Does Caregiver/Family have Issues with Lodging/Transportation while Pt is in Rehab?: No   Goals Patient/Family Goal for Rehab: PT/OT Supervision to Mod I Expected length of stay: 7-10 Pt/Family Agrees to Admission and willing to participate: Yes Program Orientation Provided & Reviewed with Pt/Caregiver Including Roles  & Responsibilities: Yes   Decrease burden of Care through IP rehab admission: Diet advancement, Decrease number of caregivers, Bowel and bladder program, and  Patient/family education   Possible need for SNF placement upon discharge: not anticipated    Patient Condition: This patient's condition remains as documented in the consult dated 11/03/22, in which the Rehabilitation Physician determined and documented that the patient's condition is appropriate for intensive rehabilitative care in an inpatient rehabilitation facility. Will admit to inpatient rehab today.  Preadmission Screen Completed By:  Jeronimo Greaves, CCC-SLP, 11/04/2022 2:24 PM ______________________________________________________________________   Discussed status with Dr. Berline Chough on 11/04/22 at 930 and received approval for admission today.  Admission Coordinator:  Jeronimo Greaves, time 1425/Date 11/04/22

## 2022-11-04 NOTE — Progress Notes (Signed)
  NEUROSURGERY PROGRESS NOTE   No issues overnight. Walking with PT yesterday.  EXAM:  BP (!) 158/75 (BP Location: Right Arm)   Pulse 63   Temp 97.8 F (36.6 C) (Oral)   Resp 18   Ht  (1.778 m)   Wt 76.2 kg   SpO2 100%   BMI 24.11 kg/m   Awake, alert, oriented  Speech fluent, appropriate  CN grossly intact  5/5 BUE/RLE Good strength BLE Incisions c/d/i   IMPRESSION:  67 y.o. male POD#4 L2-4 posterior decompression/stabilization, L2-4 left trans-psoas interbody fusion. Neurologically at baseline.  PLAN: - Cont supportive care - May be CIR candidate  Lisbeth Renshaw, MD [facility]

## 2022-11-04 NOTE — Progress Notes (Signed)
Inpatient Rehab Admissions Coordinator:    I continue to await insurance auth for CIR. Cost reviewed with Pt. I will continue to follow for potential admit pending insurance auth and bed availability.   Megan Salon, MS, CCC-SLP Rehab Admissions Coordinator  [phone] (celll) [phone] (office)

## 2022-11-05 ENCOUNTER — Inpatient Hospital Stay (HOSPITAL_COMMUNITY): Payer: Medicare Other

## 2022-11-05 DIAGNOSIS — M7989 Other specified soft tissue disorders: Secondary | ICD-10-CM

## 2022-11-05 DIAGNOSIS — G959 Disease of spinal cord, unspecified: Secondary | ICD-10-CM | POA: Diagnosis not present

## 2022-11-05 LAB — CBC WITH DIFFERENTIAL/PLATELET
Abs Immature Granulocytes: 0.03 10*3/uL (ref 0.00–0.07)
Basophils Absolute: 0 10*3/uL (ref 0.0–0.1)
Basophils Relative: 0 %
Eosinophils Absolute: 0.2 10*3/uL (ref 0.0–0.5)
Eosinophils Relative: 3 %
HCT: 32.8 % — ABNORMAL LOW (ref 39.0–52.0)
Hemoglobin: 11 g/dL — ABNORMAL LOW (ref 13.0–17.0)
Immature Granulocytes: 0 %
Lymphocytes Relative: 16 %
Lymphs Abs: 1.1 10*3/uL (ref 0.7–4.0)
MCH: 27.1 pg (ref 26.0–34.0)
MCHC: 33.5 g/dL (ref 30.0–36.0)
MCV: 80.8 fL (ref 80.0–100.0)
Monocytes Absolute: 0.8 10*3/uL (ref 0.1–1.0)
Monocytes Relative: 12 %
Neutro Abs: 4.8 10*3/uL (ref 1.7–7.7)
Neutrophils Relative %: 69 %
Platelets: 180 10*3/uL (ref 150–400)
RBC: 4.06 MIL/uL — ABNORMAL LOW (ref 4.22–5.81)
RDW: 13.9 % (ref 11.5–15.5)
WBC: 7.1 10*3/uL (ref 4.0–10.5)
nRBC: 0 % (ref 0.0–0.2)

## 2022-11-05 LAB — GLUCOSE, CAPILLARY
Glucose-Capillary: 119 mg/dL — ABNORMAL HIGH (ref 70–99)
Glucose-Capillary: 124 mg/dL — ABNORMAL HIGH (ref 70–99)
Glucose-Capillary: 113 mg/dL — ABNORMAL HIGH (ref 70–99)
Glucose-Capillary: 126 mg/dL — ABNORMAL HIGH (ref 70–99)

## 2022-11-05 LAB — COMPREHENSIVE METABOLIC PANEL
ALT: 21 U/L (ref 0–44)
AST: 23 U/L (ref 15–41)
Albumin: 2.6 g/dL — ABNORMAL LOW (ref 3.5–5.0)
Alkaline Phosphatase: 61 U/L (ref 38–126)
Anion gap: 8 (ref 5–15)
BUN: 13 mg/dL (ref 8–23)
CO2: 27 mmol/L (ref 22–32)
Calcium: 8.5 mg/dL — ABNORMAL LOW (ref 8.9–10.3)
Chloride: 102 mmol/L (ref 98–111)
Creatinine, Ser: 0.91 mg/dL (ref 0.61–1.24)
GFR, Estimated: >60 mL/min (ref >60)
Glucose, Bld: 120 mg/dL — ABNORMAL HIGH (ref 70–99)
Potassium: 3.6 mmol/L (ref 3.5–5.1)
Sodium: 137 mmol/L (ref 135–145)
Total Bilirubin: 0.6 mg/dL (ref 0.3–1.2)
Total Protein: 6.1 g/dL — ABNORMAL LOW (ref 6.5–8.1)

## 2022-11-05 MED ORDER — TIMOLOL MALEATE 0.5 % OP SOLN
1.0000 [drp] | Freq: Two times a day (BID) | OPHTHALMIC | Status: DC
  Administered 2022-11-05 – 2022-11-14 (×19): 1 [drp] via OPHTHALMIC
  Filled 2022-11-05: qty 5

## 2022-11-05 NOTE — Evaluation (Signed)
Physical Therapy Assessment and Plan  Patient Details  Name: Brian Daniels MRN: [id number] Date of Birth: June 19, 1956  PT Diagnosis: Difficulty walking, Low back pain, and Muscle weakness Rehab Potential: Good ELOS: 7-10 days   Today's Date: 11/05/2022 PT Individual Time: 0915-1025 PT Individual Time Calculation (min): 70 min    Hospital Problem: Principal Problem:   Lumbar myelopathy   Past Medical History:  Past Medical History:  Diagnosis Date   Cataracts, bilateral    removed by surgery   Diabetes mellitus without complication    type 2   Glaucoma    bilateral   Gout    Heart murmur    several years ago   Hypercholesteremia    Hypertension    Left atrial enlargement    Legally blind    bilateral   Neuropathy    hands, arm   Numbness    hands, arms   Wears partial dentures    lower   Wheelchair bound    since 05/30/19   Past Surgical History:  Past Surgical History:  Procedure Laterality Date   ANTERIOR CERVICAL DECOMPRESSION/DISCECTOMY FUSION 4 LEVELS N/A 08/05/2019   Procedure: ANTERIOR CERVICAL DECOMPRESSION/DISCECTOMY FUSION CERVICAL THREE- CERVICAL FOUR, CERVICAL FOUR- CERVICAL FIVE, CERVICAL FIVE- CERVICAL SIX, CERVICAL SIX- CERVICAL SEVEN;  Surgeon: Lisbeth Renshaw, MD;  Location: [facility] OR;  Service: Neurosurgery;  Laterality: N/A;  anterior   COLONOSCOPY     EYE SURGERY Bilateral    multiple surgeries bilateral -    MULTIPLE TOOTH EXTRACTIONS     WISDOM TOOTH EXTRACTION      Assessment & Plan Clinical Impression: Patient is a 67 year old L handed male who presented for elective lumbar laminectomy on 10/31/2022 due to severe multifactorial stenosis at L2-3, L3-4 with associated chronic bilateral leg pain, paresthesias, and subjective heaviness with difficulty walking. He was taken to the OR on 4/19 and underwent  L2, L3 laminectomy with radical bilateral facetectomy for decompression by Dr. Conchita Paris on 4/20, neurologically at baseline, perhaps slightly  worsened left hip flexion weakness likely from psoas injury.  Mod assist with transfers and mobility and max assist with lower body bathing and dressing.  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 History of ACDF 07/2019. Has history of DM-2, hypertension, hyperlipidemia, gout. Tolerating carb modifed diet. Complaining of urinary hesitancy/frequency. Was taking Beta-prostate OTC. Says tamulosin seemed to poorly affect his vision in the past. The patient requires inpatient medicine and rehabilitation evaluations and services for ongoing dysfunction secondary to lumbar myelopathy.   Patient transferred to [facility] on 11/04/2022 .   Patient currently requires min with mobility secondary to muscle weakness.  Prior to hospitalization, patient was modified independent  with mobility and lived with Family (Sister) in a House home.  Home access is 1Stairs to enter.  Patient will benefit from skilled PT intervention to maximize safe functional mobility, minimize fall risk, and decrease caregiver burden for planned discharge home with 24 hour supervision.  Anticipate patient will benefit from follow up HH at discharge.  PT - End of Session Activity Tolerance: Tolerates 30+ min activity with multiple rests Endurance Deficit: Yes Endurance Deficit Description: needs rest breaks PT Assessment Rehab Potential (ACUTE/IP ONLY): Good PT Barriers to Discharge: Other (comments) PT Barriers to Discharge Comments: needs to be at sup level for caregiver support PT Patient demonstrates impairments in the following area(s): Balance;Endurance;Safety;Pain PT Transfers Functional Problem(s): Bed Mobility;Bed to Chair;Car PT Locomotion Functional Problem(s): Ambulation;Stairs PT Plan PT Intensity: Minimum of 1-2 x/day ,45 to 90  minutes PT Frequency: 5 out of 7 days PT Duration Estimated Length of Stay: 7-10 days PT Treatment/Interventions: Ambulation/gait training;Balance/vestibular training;Discharge planning;Disease  management/prevention;DME/adaptive equipment instruction;Functional mobility training;Pain management;Patient/family education;Skin care/wound management;Stair training;Therapeutic Activities;Therapeutic Exercise;UE/LE Strength taining/ROM;UE/LE Coordination activities;Visual/perceptual remediation/compensation PT Transfers Anticipated Outcome(s): sup + LRAD PT Locomotion Anticipated Outcome(s): sup + LRAD PT Recommendation Follow Up Recommendations: Home health PT Patient destination: Home Equipment Recommended: To be determined   PT Evaluation Precautions/Restrictions Precautions Precautions: Fall;Back Precaution Booklet Issued: No Precaution Comments: legally blind; able to ambulate to bathroom without brace. Brace off in bed. Required Braces or Orthoses: Spinal Brace Spinal Brace: Lumbar corset;Applied in sitting position Restrictions Weight Bearing Restrictions: No General Chart Reviewed: Yes Family/Caregiver Present: Yes Vital Signs Pain Pain Assessment Pain Scale: 0-10 Pain Score: 6  Pain Type: Acute pain Pain Location: Back (bilateral legs) Pain Orientation: Lower Pain Descriptors / Indicators: Aching;Discomfort Pain Frequency: Constant Pain Onset: With Activity Patients Stated Pain Goal: 0 Pain Intervention(s): Repositioned Multiple Pain Sites: No Pain Interference Pain Interference Pain Effect on Sleep: 2. Occasionally Pain Interference with Therapy Activities: 1. Rarely or not at all Pain Interference with Day-to-Day Activities: 1. Rarely or not at all Home Living/Prior Functioning Home Living Available Help at Discharge: Family;Available 24 hours/day Type of Home: House Home Access: Stairs to enter [employer] of Steps: 1 Entrance Stairs-Rails: None Home Layout: One level Bathroom Shower/Tub: Engineer, manufacturing systems: Standard Bathroom Accessibility: Yes Additional Comments: SPC, Rolator, RW (used for outside of the home depending on  where he's going). In home would lightly use walls/furniture. Shower seat, Manual w/c (not using currently)  Lives With: Family (Sister) Prior Function Level of Independence: Requires assistive device for independence;Independent with transfers;Independent with basic ADLs  Able to Take Stairs?: No Driving: No Vision/Perception  Vision - History Ability to See in Adequate Light: 3 Highly impaired Vision - Assessment Additional Comments: Legally blind in both eyes at baseline. Right eye is worse than left eye. Able to see better close up versus at distance.  Cognition Overall Cognitive Status: Within Functional Limits for tasks assessed Arousal/Alertness: Awake/alert Orientation Level: Oriented X4 Sensation Sensation Light Touch: Impaired Detail Light Touch Impaired Details: Impaired RLE;Impaired LLE;Impaired LUE;Impaired RUE Hot/Cold: Impaired Detail Hot/Cold Impaired Details: Impaired LUE;Impaired RUE;Impaired RLE;Impaired LLE Additional Comments: Reports fluctuating sensation impairment in BUE and BLE. Reports that BLE are worse. Coordination Gross Motor Movements are Fluid and Coordinated: Yes Fine Motor Movements are Fluid and Coordinated: Yes Motor  Motor Motor: Within Functional Limits   Trunk/Postural Assessment  Cervical Assessment Cervical Assessment: Within Functional Limits Thoracic Assessment Thoracic Assessment: Within Functional Limits Lumbar Assessment Lumbar Assessment: Within Functional Limits Postural Control Postural Control: Within Functional Limits  Balance Balance Balance Assessed: Yes Static Sitting Balance Static Sitting - Balance Support: Bilateral upper extremity supported Static Sitting - Level of Assistance: 5: Stand by assistance Dynamic Sitting Balance Dynamic Sitting - Balance Support: Bilateral upper extremity supported Dynamic Sitting - Level of Assistance: 4: Min assist Dynamic Sitting - Balance Activities: Reaching for objects Static  Standing Balance Static Standing - Balance Support: Bilateral upper extremity supported Static Standing - Level of Assistance: 4: Min assist Extremity Assessment      RLE Assessment RLE Assessment: Exceptions to [facility] General Strength Comments: gross 5/5, hip fl 4/5 LLE Assessment LLE Assessment: Exceptions to [facility] General Strength Comments: gross 5/5, hip fl 4/5  Care Tool Care Tool Bed Mobility Roll left and right activity   Roll left and right assist level: Minimal Assistance - Patient >  75%    Sit to lying activity   Sit to lying assist level: Minimal Assistance - Patient > 75%    Lying to sitting on side of bed activity   Lying to sitting on side of bed assist level: the ability to move from lying on the back to sitting on the side of the bed with no back support.: Minimal Assistance - Patient > 75%     Care Tool Transfers Sit to stand transfer   Sit to stand assist level: Moderate Assistance - Patient 50 - 74%    Chair/bed transfer   Chair/bed transfer assist level: Minimal Assistance - Patient > 75%     Toilet transfer   Assist Level: Minimal Assistance - Patient > 75%    Car transfer   Car transfer assist level: Minimal Assistance - Patient > 75%      Care Tool Locomotion Ambulation   Assist level: Minimal Assistance - Patient > 75% Assistive device: Walker-rolling Max distance: 20  Walk 10 feet activity   Assist level: Minimal Assistance - Patient > 75% Assistive device: Walker-rolling   Walk 50 feet with 2 turns activity Walk 50 feet with 2 turns activity did not occur: Safety/medical concerns (2/2 endurance)      Walk 150 feet activity Walk 150 feet activity did not occur: Safety/medical concerns (2/2 endurance)      Walk 10 feet on uneven surfaces activity Walk 10 feet on uneven surfaces activity did not occur: Safety/medical concerns (2/2 endurance)      Stairs Stair activity did not occur: Safety/medical concerns (2/2 endurance)        Walk  up/down 1 step activity Walk up/down 1 step or curb (drop down) activity did not occur: Safety/medical concerns (2/2 endurance)      Walk up/down 4 steps activity Walk up/down 4 steps activity did not occur: Safety/medical concerns (2/2 endurance)      Walk up/down 12 steps activity Walk up/down 12 steps activity did not occur: Safety/medical concerns (2/2 endurance)      Pick up small objects from floor Pick up small object from the floor (from standing position) activity did not occur: Safety/medical concerns (2/2 endurance)      Wheelchair Is the patient using a wheelchair?: No   Wheelchair activity did not occur: N/A      Wheel 50 feet with 2 turns activity Wheelchair 50 feet with 2 turns activity did not occur: N/A    Wheel 150 feet activity Wheelchair 150 feet activity did not occur: N/A      Refer to Care Plan for Long Term Goals  SHORT TERM GOAL WEEK 1 PT Short Term Goal 1 (Week 1): STG=LTG 2/2 LOS  Recommendations for other services: None   Skilled Therapeutic Intervention Mobility Bed Mobility Bed Mobility: Rolling Right;Right Sidelying to Sit Rolling Right: Minimal Assistance - Patient > 75% Right Sidelying to Sit: Minimal Assistance - Patient > 75% Transfers Transfers: Sit to Stand;Stand to Sit;Stand Pivot Transfers Sit to Stand: Moderate Assistance - Patient 50-74% Stand to Sit: Moderate Assistance - Patient 50-74% Stand Pivot Transfers: Minimal Assistance - Patient > 75% Stand Pivot Transfer Details: Verbal cues for technique;Verbal cues for precautions/safety Transfer (Assistive device): Rolling walker Locomotion  Gait Ambulation: Yes Gait Assistance: Contact Guard/Touching assist Gait Distance (Feet): 20 Feet Assistive device: Rolling walker Stairs / Additional Locomotion Stairs: No Wheelchair Mobility Wheelchair Mobility: No  Session Note: Chart reviewed and pt agreeable to therapy. Pt received semi-reclined in bed. Session focused on evaluation  and amb practice to promote safe return to home mobility. Pt initiated session with evaluaiton as described above. Pt then completed sit to stand with ModA + RW and education on standing techniques for energy conservation. Pt then amb to WC using CGA + RW and VC for safety awareness 2/2 blindness. Pt demonstrated good understanding of back precautions. Pt also required multiple sit to stands progressing to MinA + RW from [facility] to change condom catheter with support of RN. Session education emphasized role of PT, care goals, and early d/c planning. At end of session, pt was left seated in [facility] in care of NT with all needs in reach.    Discharge Criteria: Patient will be discharged from PT if patient refuses treatment 3 consecutive times without medical reason, if treatment goals not met, if there is a change in medical status, if patient makes no progress towards goals or if patient is discharged from hospital.  The above assessment, treatment plan, treatment alternatives and goals were discussed and mutually agreed upon: by patient and by family  Dionne Milo 11/05/2022, 11:26 AM

## 2022-11-05 NOTE — Progress Notes (Signed)
Physical Therapy Session Note  Patient Details  Name: Brian Daniels MRN: [id number] Date of Birth: 1955/11/18  Today's Date: 11/05/2022 PT Individual Time: 1420-1535 PT Individual Time Calculation (min): 75 min   Short Term Goals: Week 1:  PT Short Term Goal 1 (Week 1): STG=LTG 2/2 LOS  Skilled Therapeutic Interventions/Progress Updates: Pt presented in bed agreeable to therapy. Pt states pain 4/10 at rest, increased with mobility with rest breaks and repositioning provided during session and with meds provided by RN at end of session. Pt performed supine to sit with CGA, use of bed features and increased time. PTA adjusted LSO for improved fit. Performed Sit to stand with minA and step pivot transfer to w/c with CGA. Pt transported to main gym for time management and set up in front of step. Pt was able to complete x 1 step up with B rails and minA with verbal cues for sequencing. Pt then work on gait training requiring minA to stand from w/c with pt ambulating  ~56ft with RW, initially CGA but requiring minA with fatigue. Pt noted to ambulate with narrow BOS with slight L knee instability with fatigue but no buckling nor LOB. After seated rest participated in seated LE therex:  LAQ 2lb cuff 2 x 10 Hip flexion 2lb cuff on RLE AROM LLE 2/2 pain 2 x 10 Hamstring pulls with green theraband 2 x 10  Participated in Sit to stand from elevated mat for BLE strengthening as well as working on anterior weight shifting (while maintaining precautions). Pt initially requiring minA but was able to progress to CGA from elevated mat. Pt then transferred to w/c in same manner as prior and transported back to room. Performed ambulatory transfer to bed performing Sit to stand with CGA from w/c. At EOB pt was able to doff brace with supervision and increased time. Pt required minA for sit to supine requiring assistance for BLE management. Pt repositioned to comfort and left with bed alarm on, call bell within reach  and needs met.      Therapy Documentation Precautions:  Precautions Precautions: Fall, Back Precaution Booklet Issued: No Precaution Comments: legally blind; able to ambulate to bathroom without brace. Brace off in bed. Required Braces or Orthoses: Spinal Brace Spinal Brace: Lumbar corset, Applied in sitting position Restrictions Weight Bearing Restrictions: No General:   Vital Signs: Therapy Vitals Temp: 97.8 F (36.6 C) Temp Source: Oral Pulse Rate: 73 Resp: 16 BP: 137/74 Patient Position (if appropriate): Sitting Oxygen Therapy SpO2: 97 % O2 Device: Room Air Pain: Pain Assessment Pain Score: 4  Mobility: Transfers Transfers: Sit to Stand;Stand to [employer] Transfers Sit to Stand: Minimal Assistance - Patient > 75% Stand to Sit: Minimal Assistance - Patient > 75% Stand Pivot Transfers: Minimal Assistance - Patient > 75% Stand Pivot Transfer Details: Verbal cues for technique;Verbal cues for precautions/safety;Tactile cues for placement;Verbal cues for sequencing;Manual facilitation for weight shifting Transfer (Assistive device): Rolling walker Locomotion :    Trunk/Postural Assessment : Cervical Assessment Cervical Assessment: Within Functional Limits Thoracic Assessment Thoracic Assessment: Within Functional Limits Lumbar Assessment Lumbar Assessment: Exceptions to [facility] (back surgery) Postural Control Postural Control: Deficits on evaluation Postural Limitations: bacl surgery, weakness, pain  Balance:   Exercises:   Other Treatments:      Therapy/Group: Individual Therapy  Jaala Bohle 11/05/2022, 4:39 PM

## 2022-11-05 NOTE — Discharge Summary (Signed)
Physician Discharge Summary  Patient ID: Brian Daniels MRN: [id number] DOB/AGE: Jan 03, 1956 67 y.o.  Admit date: 11/04/2022 Discharge date: 11/14/2022  Discharge Diagnoses:  Principal Problem:   Lumbar myelopathy (HCC) Active problems: Functional deficits secondary to lumbar myelopthy Hypertension Hyperlipidemia Legal blindness Glaucoma DM-2 Constipation Urinary retention UTI due to E. coli  Discharged Condition: good  Significant Diagnostic Studies:   Lower Venous DVT Study   Patient Name:  Brian Daniels  Date of Exam:   11/05/2022  Medical Rec #: [id number]        Accession #:    [id number]  Date of Birth: Dec 03, 1955        Patient Gender: M  Patient Age:   67 years  Exam Location:  [facility]  Procedure:      VAS [place] LOWER EXTREMITY VENOUS (DVT)  Referring Phys: Wendi Maya    ---------------------------------------------------------------------------  -----    Indications: Swelling, and S/P lumbar laminectomy 10/31/22.    Comparison Study: Previous 08/10/19 negative.   Performing Technologist: McKayla Maag RVT, [place]     Examination Guidelines:  A complete evaluation includes B-mode imaging, spectral Doppler, color  Doppler,  and power Doppler as needed of all accessible portions of each vessel.  Bilateral  testing is considered an integral part of a complete examination. Limited  examinations for reoccurring indications may be performed as noted. The  reflux  portion of the exam is performed with the patient in reverse  Trendelenburg.      +---------+---------------+---------+-----------+----------+--------------+   RIGHT   CompressibilityPhasicitySpontaneityPropertiesThrombus  Aging  +---------+---------------+---------+-----------+----------+--------------+   CFV     Full           Yes      Yes                                   +---------+---------------+---------+-----------+----------+--------------+   SFJ     Full                                                           +---------+---------------+---------+-----------+----------+--------------+   FV Prox  Full                                                          +---------+---------------+---------+-----------+----------+--------------+   FV Mid   Full                                                          +---------+---------------+---------+-----------+----------+--------------+   FV DistalFull                                                          +---------+---------------+---------+-----------+----------+--------------+   PFV     Full                                                          +---------+---------------+---------+-----------+----------+--------------+  POP     Full           Yes      Yes                                   +---------+---------------+---------+-----------+----------+--------------+   PTV     Full                                                          +---------+---------------+---------+-----------+----------+--------------+   PERO    Full                                                          +---------+---------------+---------+-----------+----------+--------------+          +---------+---------------+---------+-----------+----------+--------------+   LEFT    CompressibilityPhasicitySpontaneityPropertiesThrombus  Aging  +---------+---------------+---------+-----------+----------+--------------+   CFV     Full           Yes      Yes                                   +---------+---------------+---------+-----------+----------+--------------+   SFJ     Full                                                          +---------+---------------+---------+-----------+----------+--------------+   FV Prox  Full                                                           +---------+---------------+---------+-----------+----------+--------------+   FV Mid   Full                                                          +---------+---------------+---------+-----------+----------+--------------+   FV DistalFull                                                          +---------+---------------+---------+-----------+----------+--------------+   PFV     Full                                                          +---------+---------------+---------+-----------+----------+--------------+   POP  Full           Yes      Yes                                   +---------+---------------+---------+-----------+----------+--------------+   PTV     Full                                                          +---------+---------------+---------+-----------+----------+--------------+   PERO    Full                                                          +---------+---------------+---------+-----------+----------+--------------+      Summary:  BILATERAL:  - No evidence of deep vein thrombosis seen in the lower extremities,  bilaterally.  - No evidence of superficial venous thrombosis in the lower extremities,  bilaterally.  -No evidence of popliteal cyst, bilaterally.    *See table(s) above for measurements and observations.   Electronically signed by Sherald Hess MD on 11/05/2022 at 4:20:56 PM.     Final     Labs:  Basic Metabolic Panel: Recent Labs  Lab 11/10/22 0554  NA 138  K 3.4*  CL 104  CO2 26  GLUCOSE 96  BUN 15  CREATININE 0.94  CALCIUM 8.4*    CBC: Recent Labs  Lab 11/10/22 0554  WBC 6.7  HGB 10.8*  HCT 32.2*  MCV 80.9  PLT 288    CBG: Recent Labs  Lab 11/08/22 0616 11/08/22 1139 11/08/22 1631 11/08/22 2147 11/09/22 0638  GLUCAP 92 127* 109* 97 97   Urinalysis    Component Value Date/Time   COLORURINE YELLOW 11/11/2022 1330   APPEARANCEUR CLOUDY (A)  11/11/2022 1330   LABSPEC 1.013 11/11/2022 1330   PHURINE 6.0 11/11/2022 1330   GLUCOSEU NEGATIVE 11/11/2022 1330   HGBUR NEGATIVE 11/11/2022 1330   BILIRUBINUR NEGATIVE 11/11/2022 1330   KETONESUR NEGATIVE 11/11/2022 1330   PROTEINUR NEGATIVE 11/11/2022 1330   NITRITE NEGATIVE 11/11/2022 1330   LEUKOCYTESUR MODERATE (A) 11/11/2022 1330  tatus: Preliminary result     Visible to patient: No (not released)     Next appt: None   Specimen Information: Urine, Clean Catch  0 Result Notes    Component 3 d ago  Specimen Description URINE, CLEAN CATCH  Special Requests NONE  Culture  Abnormal  >=100,000 COLONIES/mL ESCHERICHIA COLI CULTURE REINCUBATED FOR BETTER GROWTH Performed at [facility], [address] [place], [place] [address]   Report Status PENDING  Organism ID, Bacteria ESCHERICHIA COLI Abnormal   Resulting Agency [facility]     Susceptibility   Escherichia coli    MIC    AMPICILLIN 8 SENSITIVE Sensitive    AMPICILLIN/SULBACTAM <=2 SENSITIVE Sensitive    CEFAZOLIN <=4 SENSITIVE Sensitive    CEFEPIME <=0.12 SENS... Sensitive    CEFTRIAXONE <=0.25 SENS... Sensitive    CIPROFLOXACIN <=0.25 SENS... Sensitive    GENTAMICIN <=1 SENSITIVE Sensitive    IMIPENEM 0.5 SENSITIVE Sensitive    NITROFURANTOIN <=16 SENSIT... Sensitive    PIP/TAZO <=4 SENSITIVE  Sensitive    TRIMETH/SULFA <=20 SENSIT... Sensitive           Susceptibility Comments  Escherichia coli  >=100,000 COLONIES/mL ESCHERICHIA COLI      Specimen Collected: 11/11/22 08:15 Last Resulted: 11/13/22 12:37          Brief HPI:   Brian Daniels is a 67 y.o. male who presented for elective lumbar laminectomy on 10/31/2022 due to severe multifactorial stenosis at L2-3, L3-4 with associated chronic bilateral leg pain, paresthesias, and subjective heaviness with difficulty walking. He was taken to the OR on 4/19 and underwent  L2, L3 laminectomy with radical bilateral facetectomy for decompression by Dr.  Conchita Paris on 4/20, neurologically at baseline, perhaps slightly worsened left hip flexion weakness likely from psoas injury.  Mod assist with transfers and mobility and max assist with lower body bathing and dressing.  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 History of ACDF 07/2019. Has history of DM-2, hypertension, hyperlipidemia, gout. Tolerating carb modifed diet. Complaining of urinary hesitancy/frequency. Was taking Beta-prostate OTC. Says tamulosin seemed to poorly affect his vision in the past.    Hospital Course: Brian Daniels was admitted to rehab 11/04/2022 for inpatient therapies to consist of PT, ST and OT at least three hours five days a week. Past admission physiatrist, therapy team and rehab RN have worked together to provide customized collaborative inpatient rehab. Labs stable. Pain controlled with Robaxin and oxycodone as needed. Bladder scan and PVRs monitored. Condom catheter left in place to due vision impairment. Bullous lesions from tape adhesive along flank incision noted. These were healing well at time of discharge. Incisions remained well approximated. BLE venous duplex negative for DVT. Bowel regimen working and patient eating well. Started Lovenox for DVT prophylaxis after contacting NS on 4/28. UA and culture obtained on 4/30 due to incontinence. Positive for E. Coli and started on Keflex. PT worked with patient to log roll and to use urinal. Did not tolerate Flomax in the past so did not start. Appointment made with [facility] for evaluation. Staples removed and steri-strips placed on 5/03. May shower without bandage.  Blood pressures were monitored on TID basis and amlodipine 5 mg daily, benazepril 10 mg daily, metoprolol succinate 25 mg daily continued. Remained stable.    Diabetes has been monitored with ac/hs CBG checks and SSI was use prn for tighter BS control. Metformin 500 mg with supper continued.   Rehab course: During patient's stay in rehab weekly team  conferences were held to monitor patient's progress, set goals and discuss barriers to discharge. At admission, patient required min with mobility and SBA-Mod A  with basic self-care skills.   He has had improvement in activity tolerance, balance, postural control as well as ability to compensate for deficits. He has had improvement in functional use RUE/LUE  and RLE/LLE as well as improvement in awareness. Patient has met 7 of 7 long term goals due to improved activity tolerance, improved balance, improved postural control, increased strength, and ability to compensate for deficits.  Patient to discharge at an ambulatory level Supervision.   Patient's care partner is independent to provide the necessary physical assistance at discharge. Patient has met 6 of 7 long term goals due to improved activity tolerance, improved balance, postural control, and ability to compensate for deficits.  Pt made steady progress with BADLs and functional transfers during this admission. PT arranged with [facility] as outpatient.    Disposition: Discharge disposition: 01-Home or Self Care  Diet: Regular  Special Instructions: No driving, alcohol consumption or tobacco use.  30-35 minutes were spent on discharge planning and discharge summary.  Discharge Instructions     Ambulatory referral to Physical Medicine Rehab   Complete by: As directed    Hospital follow-up      Allergies as of 11/14/2022   No Known Allergies      Medication List     STOP taking these medications    baclofen 10 MG tablet Commonly known as: LIORESAL   bisacodyl 10 MG suppository Commonly known as: DULCOLAX   diclofenac Sodium 1 % Gel Commonly known as: VOLTAREN   DULoxetine 60 MG capsule Commonly known as: CYMBALTA   enoxaparin 40 MG/0.4ML injection Commonly known as: Lovenox   HYDROcodone-acetaminophen 5-325 MG tablet Commonly known as: NORCO/VICODIN   meloxicam 15 MG tablet Commonly known as: MOBIC    senna 8.6 MG Tabs tablet Commonly known as: SENOKOT   tamsulosin 0.4 MG Caps capsule Commonly known as: FLOMAX       TAKE these medications    acetaminophen 325 MG tablet Commonly known as: TYLENOL Take 1-2 tablets (325-650 mg total) by mouth every 4 (four) hours as needed for mild pain. What changed:  how much to take reasons to take this   amLODipine 5 MG tablet Commonly known as: NORVASC Take 5 mg by mouth daily.   aspirin EC 81 MG tablet Take 1 tablet (81 mg total) by mouth daily.   atorvastatin 20 MG tablet Commonly known as: LIPITOR Take 1 tablet (20 mg total) by mouth daily.   B-12 5000 MCG Caps Take 5,000 mcg by mouth daily.   benazepril 10 MG tablet Commonly known as: LOTENSIN Take 1 tablet (10 mg total) by mouth daily.   BL CALCIUM-MAGNESIUM-ZINC PO Take 1 tablet by mouth daily.   brimonidine 0.2 % ophthalmic solution Commonly known as: ALPHAGAN Place 1 drop into both eyes 3 (three) times daily.   cephALEXin 500 MG capsule Commonly known as: KEFLEX Take 1 capsule (500 mg total) by mouth every 8 (eight) hours.   dorzolamide-timolol 2-0.5 % ophthalmic solution Commonly known as: COSOPT Place 1 drop into the left eye 2 (two) times daily.   Fish Oil 1000 MG Caps Take 1,000 mg by mouth at bedtime.   GINSENG PO Take 2 tablets by mouth daily as needed (energy boost).   lidocaine 5 % Commonly known as: LIDODERM Place 2 patches onto the skin daily. Remove & Discard patch within 12 hours or as directed by MD What changed:  when to take this reasons to take this   metFORMIN 500 MG 24 hr tablet Commonly known as: GLUCOPHAGE-XR Take 1 tablet (500 mg total) by mouth daily with supper. What changed: when to take this   methocarbamol 500 MG tablet Commonly known as: ROBAXIN Take 1 tablet (500 mg total) by mouth every 6 (six) hours as needed for muscle spasms. What changed: reasons to take this   metoprolol succinate 25 MG 24 hr tablet Commonly  known as: TOPROL-XL Take 25 mg by mouth daily.   OVER THE COUNTER MEDICATION Take 1 capsule by mouth daily. Super Beta Prostate   oxyCODONE 5 MG immediate release tablet Commonly known as: Oxy IR/ROXICODONE Take 1 tablet (5 mg total) by mouth every 8 (eight) hours as needed for moderate pain ((score 4 to 6)).   prednisoLONE acetate 1 % ophthalmic suspension Commonly known as: PRED FORTE Place 1 drop into the right eye 2 (two) times daily.  sodium chloride 5 % ophthalmic solution Commonly known as: MURO 128 Place 1 drop into both eyes as needed for eye irritation.   timolol 0.5 % ophthalmic solution Commonly known as: TIMOPTIC Place 1 drop into both eyes 2 (two) times daily.   vitamin C 1000 MG tablet Take 1,000 mg by mouth daily.   vitamin E 180 MG (400 UNITS) capsule Take 180 mg by mouth daily.        Follow-up Information     Lovorn, Aundra Millet, MD Follow up.   Specialty: Physical Medicine and Rehabilitation Why: office will call you to arrange your appt (sent) Contact information: [address] [place] [address] [phone]         Knox Royalty, MD Follow up.   Specialty: Family Medicine Why: Call the office in 1-2 days to make arrangements for hospital follow-up appointment. Contact information: [address] [place] [address] [phone]         Lisbeth Renshaw, MD Follow up.   Specialty: Neurosurgery Why: Call the office in 1-2 days to make arrangements for hospital follow-up appointment. Contact information: [address] [place] [address] [phone]         Noel Christmas, MD. Go to.   Specialty: Urology Why: Dec 12, 2022 10:30 am (referral for urinary retention) Contact information: [address] [place] [address] [phone]                 Signed: Milinda Antis 11/14/2022, 10:16 AM

## 2022-11-05 NOTE — Progress Notes (Signed)
Inpatient Rehabilitation  Patient information reviewed and entered into eRehab system by Elanna Bert Bracy Pepper, OTR/L, Rehab Quality Coordinator.   Information including medical coding, functional ability and quality indicators will be reviewed and updated through discharge.   

## 2022-11-05 NOTE — Evaluation (Signed)
Occupational Therapy Assessment and Plan  Patient Details  Name: Brian Daniels MRN: [id number] Date of Birth: 1956-04-06  OT Diagnosis: acute pain, lumbago (low back pain), and muscle weakness (generalized) Rehab Potential: Rehab Potential (ACUTE ONLY): Excellent ELOS: 7-10 days   Today's Date: 11/05/2022 OT Individual Time: 4098-1191 OT Individual Time Calculation (min): 65 min     Hospital Problem: Principal Problem:   Lumbar myelopathy   Past Medical History:  Past Medical History:  Diagnosis Date   Cataracts, bilateral    removed by surgery   Diabetes mellitus without complication    type 2   Glaucoma    bilateral   Gout    Heart murmur    several years ago   Hypercholesteremia    Hypertension    Left atrial enlargement    Legally blind    bilateral   Neuropathy    hands, arm   Numbness    hands, arms   Wears partial dentures    lower   Wheelchair bound    since 05/30/19   Past Surgical History:  Past Surgical History:  Procedure Laterality Date   ANTERIOR CERVICAL DECOMPRESSION/DISCECTOMY FUSION 4 LEVELS N/A 08/05/2019   Procedure: ANTERIOR CERVICAL DECOMPRESSION/DISCECTOMY FUSION CERVICAL THREE- CERVICAL FOUR, CERVICAL FOUR- CERVICAL FIVE, CERVICAL FIVE- CERVICAL SIX, CERVICAL SIX- CERVICAL SEVEN;  Surgeon: Lisbeth Renshaw, MD;  Location: [facility] OR;  Service: Neurosurgery;  Laterality: N/A;  anterior   COLONOSCOPY     EYE SURGERY Bilateral    multiple surgeries bilateral -    MULTIPLE TOOTH EXTRACTIONS     WISDOM TOOTH EXTRACTION      Assessment & Plan Clinical Impression: Patient is a 67 y.o. year old male who presented for elective lumbar laminectomy on 10/31/2022 due to severe multifactorial stenosis at L2-3, L3-4 with associated chronic bilateral leg pain, paresthesias, and subjective heaviness with difficulty walking. He was taken to the OR on 4/19 and underwent  L2, L3 laminectomy with radical bilateral facetectomy for decompression by Dr. Conchita Paris  on 4/20, neurologically at baseline, perhaps slightly worsened left hip flexion weakness likely from psoas injury.   Patient transferred to CIR on 11/04/2022 .    Patient currently requires  SBA-Mod A  with basic self-care skills secondary to muscle weakness, low vision, and decreased standing balance, decreased postural control, and decreased balance strategies.  Prior to hospitalization, patient could complete BADL tasks with modified independent .  Patient will benefit from skilled intervention to decrease level of assist with basic self-care skills prior to discharge home with care partner.  Anticipate patient will require  PRN supervision  and no further OT follow recommended.  OT - End of Session Activity Tolerance: Decreased this session Endurance Deficit: Yes Endurance Deficit Description: needs rest breaks OT Assessment Rehab Potential (ACUTE ONLY): Excellent OT Patient demonstrates impairments in the following area(s): Balance;Sensory;Endurance;Motor;Pain OT Basic ADL's Functional Problem(s): Grooming;Bathing;Dressing;Toileting OT Transfers Functional Problem(s): Toilet;Tub/Shower OT Plan OT Intensity: Minimum of 1-2 x/day, 45 to 90 minutes OT Frequency: 5 out of 7 days OT Duration/Estimated Length of Stay: 7-10 days OT Treatment/Interventions: Balance/vestibular training;Neuromuscular re-education;Self Care/advanced ADL retraining;Therapeutic Exercise;UE/LE Strength taining/ROM;Pain management;DME/adaptive equipment instruction;Patient/family education;UE/LE Coordination activities;Discharge planning;Functional mobility training;Therapeutic Activities OT Basic Self-Care Anticipated Outcome(s): SBA OT Toileting Anticipated Outcome(s): Mod I OT Bathroom Transfers Anticipated Outcome(s): SBA OT Recommendation Patient destination: Home Follow Up Recommendations: None Equipment Recommended: None recommended by OT   OT Evaluation Precautions/Restrictions  Precautions Precautions:  Fall;Back Precaution Booklet Issued: No Precaution Comments: legally blind; able to ambulate to  bathroom without brace. Brace off in bed. Required Braces or Orthoses: Spinal Brace Spinal Brace: Lumbar corset;Applied in sitting position Restrictions Weight Bearing Restrictions: No  Pain Pain Assessment Pain Scale: 0-10 Pain Score: 6  Pain Type: Acute pain;Surgical pain Pain Location: Back Pain Orientation: Lower Pain Descriptors / Indicators: Aching;Discomfort Pain Frequency: Intermittent Pain Onset: On-going Patients Stated Pain Goal: 2 Pain Intervention(s): Medication (See eMAR) Home Living/Prior Functioning Home Living Family/patient expects to be discharged to:: Private residence Living Arrangements: Other relatives Available Help at Discharge: Family, Available 24 hours/day Type of Home: House Home Access: Stairs to enter Secretary/administrator of Steps: 1 Entrance Stairs-Rails: None Home Layout: One level Bathroom Shower/Tub: Engineer, manufacturing systems: Standard Bathroom Accessibility: Yes Additional Comments: SPC, Rolator, RW (used for outside of the home depending on where he's going). In home would lightly use walls/furniture. Shower seat, Manual w/c (not using currently)  Lives With: Family (Sister) Prior Function Level of Independence: Requires assistive device for independence, Independent with transfers, Independent with basic ADLs  Able to Take Stairs?: No Driving: No Vision Baseline Vision/History: 3 Glaucoma Ability to See in Adequate Light: 3 Highly impaired Patient Visual Report: No change from baseline Vision Assessment?: Yes Additional Comments: Legally blind in both eyes at baseline. Right eye is worse than left eye. Able to see better close up versus at distance.  Cognition Cognition Overall Cognitive Status: Within Functional Limits for tasks assessed Arousal/Alertness: Awake/alert Brief Interview for Mental Status (BIMS) Repetition of  Three Words (First Attempt): 3 Temporal Orientation: Year: Correct Temporal Orientation: Month: Accurate within 5 days Temporal Orientation: Day: Correct Recall: "Sock": Yes, no cue required Recall: "Blue": Yes, no cue required Recall: "Bed": Yes, no cue required BIMS Summary Score: 15 Sensation Sensation Light Touch Impaired Details: Impaired RLE;Impaired LLE;Impaired LUE;Impaired RUE Hot/Cold: Impaired Detail Hot/Cold Impaired Details: Impaired LUE;Impaired RUE;Impaired RLE;Impaired LLE Additional Comments: Reports fluctuating sensation impairment in BUE and BLE. Reports that BLE are worse. Coordination Gross Motor Movements are Fluid and Coordinated: Yes Fine Motor Movements are Fluid and Coordinated: Yes Motor  Motor Motor: Within Functional Limits  Trunk/Postural Assessment  Cervical Assessment Cervical Assessment: Within Functional Limits Thoracic Assessment Thoracic Assessment: Within Functional Limits Lumbar Assessment Lumbar Assessment: Exceptions to [facility] (back surgery) Postural Control Postural Control: Deficits on evaluation Postural Limitations: bacl surgery, weakness, pain   Extremity/Trunk Assessment RUE Assessment RUE Assessment: Within Functional Limits General Strength Comments: Shoulder flexion: 4-/5, bicep extension 4-/5 LUE Assessment LUE Assessment: Within Functional Limits General Strength Comments: shoulder flexion: 4-/5, elbow flexion: 4-/5  Care Tool Care Tool Self Care Eating   Eating Assist Level: Set up assist    Oral Care    Oral Care Assist Level: Set up assist    Bathing   Body parts bathed by patient: Right arm;Left arm;Chest;Abdomen;Front perineal area;Buttocks;Right upper leg;Left upper leg;Face Body parts bathed by helper: Right lower leg;Left lower leg   Assist Level: Minimal Assistance - Patient > 75%    Upper Body Dressing(including orthotics)   What is the patient wearing?: Pull over shirt;Orthosis Orthosis activity level:  Performed by helper Assist Level: Minimal Assistance - Patient > 75%    Lower Body Dressing (excluding footwear)   What is the patient wearing?: Pants;Underwear/pull up Assist for lower body dressing: Moderate Assistance - Patient 50 - 74%    Putting on/Taking off footwear   What is the patient wearing?: Non-skid slipper socks Assist for footwear: Total Assistance - Patient < 25%       Care Tool  Toileting Toileting activity   Assist for toileting: Minimal Assistance - Patient > 75%       Care Tool Transfers Sit to stand transfer   Sit to stand assist level: Minimal Assistance - Patient > 75%    Chair/bed transfer   Chair/bed transfer assist level: Minimal Assistance - Patient > 75%     Toilet transfer   Assist Level: Minimal Assistance - Patient > 75%     Care Tool Cognition  Expression of Ideas and Wants Expression of Ideas and Wants: 4. Without difficulty (complex and basic) - expresses complex messages without difficulty and with speech that is clear and easy to understand  Understanding Verbal and Non-Verbal Content Understanding Verbal and Non-Verbal Content: 4. Understands (complex and basic) - clear comprehension without cues or repetitions   Memory/Recall Ability Memory/Recall Ability : Current season;Location of own room;That he or she is in a hospital/hospital unit   Refer to Care Plan for Long Term Goals  SHORT TERM GOAL WEEK 1 OT Short Term Goal 1 (Week 1): STG = LTGS (d/t ELOS)  Recommendations for other services: None    Skilled Therapeutic Intervention ADL ADL Eating: Set up Where Assessed-Eating: Wheelchair Where Assessed-Grooming: Standing at sink Upper Body Bathing: Setup Where Assessed-Upper Body Bathing: Sitting at sink;Wheelchair Lower Body Bathing: Moderate assistance Where Assessed-Lower Body Bathing: Sitting at sink;Wheelchair Upper Body Dressing: Setup Where Assessed-Upper Body Dressing: Sitting at sink;Wheelchair Lower Body Dressing:  Maximal assistance Where Assessed-Lower Body Dressing: Wheelchair;Sitting at sink Toileting: Minimal assistance Where Assessed-Toileting: Toilet;Bedside Commode Toilet Transfer: Minimal assistance Toilet Transfer Method: Proofreader: Bedside commode;Grab bars Tub/Shower Transfer: Unable to assess Tub/Shower Transfer Method: Unable to assess Film/video editor: Unable to assess Film/video editor Method: Unable to assess Mobility  Transfers Sit to Stand: Minimal Assistance - Patient > 75% Stand to Sit: Minimal Assistance - Patient > 75%   Discharge Criteria: Patient will be discharged from OT if patient refuses treatment 3 consecutive times without medical reason, if treatment goals not met, if there is a change in medical status, if patient makes no progress towards goals or if patient is discharged from hospital.  The above assessment, treatment plan, treatment alternatives and goals were discussed and mutually agreed upon: by patient  Limmie Patricia, OTR/L,CBIS  Supplemental OT - [facility] Secure Chat Preferred   11/05/2022, 2:56 PM

## 2022-11-05 NOTE — Progress Notes (Signed)
Bilateral lower extremity venous study completed.   Preliminary results relayed to RN.  Please see CV Procedures for preliminary results.  Nema Oatley, RVT  2:00 PM 11/05/22

## 2022-11-05 NOTE — Progress Notes (Signed)
PROGRESS NOTE   Subjective/Complaints:  Got wrong breakfast- wants omelet.  Feels like needs to have bM_ LBM 2 days ago- tried this AM- only gas.  Pain usually around 5/10- but eased up without meds this AM- at 2/10 currently.   ROS:  Pt denies SOB, abd pain, CP, N/V/ (+)C/D, and vision changes   Objective:   VAS [place] LOWER EXTREMITY VENOUS (DVT)  Result Date: 11/05/2022  Lower Venous DVT Study Patient Name:  Brian Daniels  Date of Exam:   11/05/2022 Medical Rec #: [id number]        Accession #:    [id number] Date of Birth: Jun 07, 1956        Patient Gender: M Patient Age:   67 years Exam Location:  [facility] Procedure:      VAS [place] LOWER EXTREMITY VENOUS (DVT) Referring Phys: Wendi Maya --------------------------------------------------------------------------------  Indications: Swelling, and S/P lumbar laminectomy 10/31/22.  Comparison Study: Previous 08/10/19 negative. Performing Technologist: McKayla Maag RVT, [place]  Examination Guidelines: A complete evaluation includes B-mode imaging, spectral Doppler, color Doppler, and power Doppler as needed of all accessible portions of each vessel. Bilateral testing is considered an integral part of a complete examination. Limited examinations for reoccurring indications may be performed as noted. The reflux portion of the exam is performed with the patient in reverse Trendelenburg.  +---------+---------------+---------+-----------+----------+--------------+ RIGHT    CompressibilityPhasicitySpontaneityPropertiesThrombus Aging +---------+---------------+---------+-----------+----------+--------------+ CFV      Full           Yes      Yes                                 +---------+---------------+---------+-----------+----------+--------------+ SFJ      Full                                                         +---------+---------------+---------+-----------+----------+--------------+ FV Prox  Full                                                        +---------+---------------+---------+-----------+----------+--------------+ FV Mid   Full                                                        +---------+---------------+---------+-----------+----------+--------------+ FV DistalFull                                                        +---------+---------------+---------+-----------+----------+--------------+ PFV  Full                                                        +---------+---------------+---------+-----------+----------+--------------+ POP      Full           Yes      Yes                                 +---------+---------------+---------+-----------+----------+--------------+ PTV      Full                                                        +---------+---------------+---------+-----------+----------+--------------+ PERO     Full                                                        +---------+---------------+---------+-----------+----------+--------------+   +---------+---------------+---------+-----------+----------+--------------+ LEFT     CompressibilityPhasicitySpontaneityPropertiesThrombus Aging +---------+---------------+---------+-----------+----------+--------------+ CFV      Full           Yes      Yes                                 +---------+---------------+---------+-----------+----------+--------------+ SFJ      Full                                                        +---------+---------------+---------+-----------+----------+--------------+ FV Prox  Full                                                        +---------+---------------+---------+-----------+----------+--------------+ FV Mid   Full                                                         +---------+---------------+---------+-----------+----------+--------------+ FV DistalFull                                                        +---------+---------------+---------+-----------+----------+--------------+ PFV      Full                                                        +---------+---------------+---------+-----------+----------+--------------+  POP      Full           Yes      Yes                                 +---------+---------------+---------+-----------+----------+--------------+ PTV      Full                                                        +---------+---------------+---------+-----------+----------+--------------+ PERO     Full                                                        +---------+---------------+---------+-----------+----------+--------------+     Summary: BILATERAL: - No evidence of deep vein thrombosis seen in the lower extremities, bilaterally. - No evidence of superficial venous thrombosis in the lower extremities, bilaterally. -No evidence of popliteal cyst, bilaterally.   *See table(s) above for measurements and observations. Electronically signed by Sherald Hess MD on 11/05/2022 at 4:20:56 PM.    Final    Recent Labs    11/05/22 0555  WBC 7.1  HGB 11.0*  HCT 32.8*  PLT 180   Recent Labs    11/05/22 0555  NA 137  K 3.6  CL 102  CO2 27  GLUCOSE 120*  BUN 13  CREATININE 0.91  CALCIUM 8.5*    Intake/Output Summary (Last 24 hours) at 11/05/2022 1815 Last data filed at 11/05/2022 0753 Gross per 24 hour  Intake --  Output 1500 ml  Net -1500 ml        Physical Exam: Vital Signs Blood pressure 137/74, pulse 73, temperature 97.8 F (36.6 C), temperature source Oral, resp. rate 16, height  (1.778 m), weight 80.3 kg, SpO2 97 %.    General: awake, alert, appropriate, finished most of breakfast; NAD HENT: whitish lenses- glaucoma; oropharynx moist CV: regular rate and rhythm; no JVD Pulmonary:  CTA B/L; no W/R/R- good air movement GI: soft, NT, ND, (+)BS- slightly hyperactive Psychiatric: appropriate Neurological: Ox3 Musculoskeletal:     Cervical back: Neck supple. No tenderness.     Right lower leg: No edema.     Left lower leg: No edema.     Comments: Ue's 5/5 B/L RLE- 5-/5 except HF on R 4+/5 LLE- 5-5/5 except L HF 4+/5  Skin:    General: Skin is warm and dry.     Comments: PIV in dorsum of each hand without erythema Lumbar incision and L groin/abd incision with honeycomb dressing in place- look OK  Neurological:     Mental Status: He is alert and oriented to person, place, and time.     Comments: Intact to light touch in Ue's and torso L1 numbness and tingling L>R; toherwise intact to light touch in all 4 extremities   Assessment/Plan: 1. Functional deficits which require 3+ hours per day of interdisciplinary therapy in a comprehensive inpatient rehab setting. Physiatrist is providing close team supervision and 24 hour management of active medical problems listed below. Physiatrist and rehab team continue to assess barriers to discharge/monitor patient progress toward functional and medical goals  Care Tool:  Bathing    Body parts bathed by patient: Right arm, Left arm, Chest, Abdomen, Front perineal area, Buttocks, Right upper leg, Left upper leg, Face   Body parts bathed by helper: Right lower leg, Left lower leg     Bathing assist Assist Level: Minimal Assistance - Patient > 75%     Upper Body Dressing/Undressing Upper body dressing   What is the patient wearing?: Pull over shirt, Orthosis Orthosis activity level: Performed by helper  Upper body assist Assist Level: Minimal Assistance - Patient > 75%    Lower Body Dressing/Undressing Lower body dressing      What is the patient wearing?: Pants, Underwear/pull up     Lower body assist Assist for lower body dressing: Moderate Assistance - Patient 50 - 74%     Toileting Toileting    Toileting  assist Assist for toileting: Minimal Assistance - Patient > 75%     Transfers Chair/bed transfer  Transfers assist     Chair/bed transfer assist level: Minimal Assistance - Patient > 75%     Locomotion Ambulation   Ambulation assist      Assist level: Minimal Assistance - Patient > 75% Assistive device: Walker-rolling Max distance: 67   Walk 10 feet activity   Assist     Assist level: Minimal Assistance - Patient > 75% Assistive device: Walker-rolling   Walk 50 feet activity   Assist Walk 50 feet with 2 turns activity did not occur: Safety/medical concerns (2/2 endurance)  Assist level: Minimal Assistance - Patient > 75% Assistive device: Walker-rolling    Walk 150 feet activity   Assist Walk 150 feet activity did not occur: Safety/medical concerns (2/2 endurance)         Walk 10 feet on uneven surface  activity   Assist Walk 10 feet on uneven surfaces activity did not occur: Safety/medical concerns (2/2 endurance)         Wheelchair     Assist Is the patient using a wheelchair?: No   Wheelchair activity did not occur: N/A         Wheelchair 50 feet with 2 turns activity    Assist    Wheelchair 50 feet with 2 turns activity did not occur: N/A       Wheelchair 150 feet activity     Assist  Wheelchair 150 feet activity did not occur: N/A       Blood pressure 137/74, pulse 73, temperature 97.8 F (36.6 C), temperature source Oral, resp. rate 16, height 5\' 10"  (1.778 m), weight 80.3 kg, SpO2 97 %.  Medical Problem List and Plan: 1. Functional deficits secondary to lumbar myelopathy             -patient may  shower if cover incisions             -ELOS/Goals: 7-10 mod I   First day of evaluations- cont' CIR PT and OT 2.  Antithrombotics: -DVT/anticoagulation:  Mechanical:  Antiembolism stockings, knee (TED hose) Bilateral lower extremities  4/24- will check on when can start Lovenox             -antiplatelet therapy:  none             -follow up BLE venous duplex   3. Pain Management: Tylenol, baclofen, Robaxin, oxycodone as needed   4/24- pain well controlled this AM- if gets in right position- con't meds 4. Mood/Behavior/Sleep: LCSW to evaluate and provide emotional support             -  antipsychotic agents: n/a   5. Neuropsych/cognition: This patient is capable of making decisions on his own behalf.   6. Skin/Wound Care: Routine skin care checks   7. Fluids/Electrolytes/Nutrition: Routine Is and Os and follow-up chemistries   8: Hypertension: monitor TID and prn             -continue amlodipine 5 mg daily             -continue benazepril 10 mg daily             -continue metroprolol succinate 25 mg daily   9: Hyperlipidemia: continue statin   10: Legally blind- from glaucoma   11: DM-2: CBGs four times daily; carb modified diet; A!c 6.2% on 4/09             -continue metformin 500 mg with supper             -continue SSI   12: Glaucoma: continue gtts X 4  4/24- got contacted to restart drops- did   13: History of gout: no reports of flare   14: Urinary hesitancy/frequency- will check PVRs/bladder scans   15: Constipation: continue regimen; PRNs ordered  4/24- LBM 2 days ago- if no BM by tomorrow, will intervene          LOS: 1 days A FACE TO FACE EVALUATION WAS PERFORMED  Aubrey Blackard 11/05/2022, 6:15 PM

## 2022-11-06 DIAGNOSIS — G959 Disease of spinal cord, unspecified: Secondary | ICD-10-CM | POA: Diagnosis not present

## 2022-11-06 LAB — GLUCOSE, CAPILLARY
Glucose-Capillary: 85 mg/dL (ref 70–99)
Glucose-Capillary: 119 mg/dL — ABNORMAL HIGH (ref 70–99)
Glucose-Capillary: 95 mg/dL (ref 70–99)
Glucose-Capillary: 138 mg/dL — ABNORMAL HIGH (ref 70–99)

## 2022-11-06 NOTE — Plan of Care (Signed)
  Problem: RH BLADDER ELIMINATION Goal: RH STG MANAGE BLADDER WITH ASSISTANCE Description: STG Manage Bladder With toileting Assistance Outcome: Not Progressing; condom cath

## 2022-11-06 NOTE — Progress Notes (Signed)
PROGRESS NOTE   Subjective/Complaints:  Pt reports condom cath came off- was wet ,because didn't know had come off.  LBM yesterday- less constipation- feeling better.   Got coffee and no omelet again this AM.    ROS:   Pt denies SOB, abd pain, CP, N/V/C/D, and vision changes    Objective:   VAS [place] LOWER EXTREMITY VENOUS (DVT)  Result Date: 11/05/2022  Lower Venous DVT Study Patient Name:  Brian Daniels  Date of Exam:   11/05/2022 Medical Rec #: [id number]        Accession #:    [id number] Date of Birth: 07-03-1956        Patient Gender: M Patient Age:   67 years Exam Location:  [facility] Procedure:      VAS [place] LOWER EXTREMITY VENOUS (DVT) Referring Phys: Wendi Maya --------------------------------------------------------------------------------  Indications: Swelling, and S/P lumbar laminectomy 10/31/22.  Comparison Study: Previous 08/10/19 negative. Performing Technologist: McKayla Maag RVT, [place]  Examination Guidelines: A complete evaluation includes B-mode imaging, spectral Doppler, color Doppler, and power Doppler as needed of all accessible portions of each vessel. Bilateral testing is considered an integral part of a complete examination. Limited examinations for reoccurring indications may be performed as noted. The reflux portion of the exam is performed with the patient in reverse Trendelenburg.  +---------+---------------+---------+-----------+----------+--------------+ RIGHT    CompressibilityPhasicitySpontaneityPropertiesThrombus Aging +---------+---------------+---------+-----------+----------+--------------+ CFV      Full           Yes      Yes                                 +---------+---------------+---------+-----------+----------+--------------+ SFJ      Full                                                        +---------+---------------+---------+-----------+----------+--------------+ FV  Prox  Full                                                        +---------+---------------+---------+-----------+----------+--------------+ FV Mid   Full                                                        +---------+---------------+---------+-----------+----------+--------------+ FV DistalFull                                                        +---------+---------------+---------+-----------+----------+--------------+ PFV      Full                                                        +---------+---------------+---------+-----------+----------+--------------+  POP      Full           Yes      Yes                                 +---------+---------------+---------+-----------+----------+--------------+ PTV      Full                                                        +---------+---------------+---------+-----------+----------+--------------+ PERO     Full                                                        +---------+---------------+---------+-----------+----------+--------------+   +---------+---------------+---------+-----------+----------+--------------+ LEFT     CompressibilityPhasicitySpontaneityPropertiesThrombus Aging +---------+---------------+---------+-----------+----------+--------------+ CFV      Full           Yes      Yes                                 +---------+---------------+---------+-----------+----------+--------------+ SFJ      Full                                                        +---------+---------------+---------+-----------+----------+--------------+ FV Prox  Full                                                        +---------+---------------+---------+-----------+----------+--------------+ FV Mid   Full                                                        +---------+---------------+---------+-----------+----------+--------------+ FV DistalFull                                                         +---------+---------------+---------+-----------+----------+--------------+ PFV      Full                                                        +---------+---------------+---------+-----------+----------+--------------+ POP      Full           Yes      Yes                                 +---------+---------------+---------+-----------+----------+--------------+  PTV      Full                                                        +---------+---------------+---------+-----------+----------+--------------+ PERO     Full                                                        +---------+---------------+---------+-----------+----------+--------------+     Summary: BILATERAL: - No evidence of deep vein thrombosis seen in the lower extremities, bilaterally. - No evidence of superficial venous thrombosis in the lower extremities, bilaterally. -No evidence of popliteal cyst, bilaterally.   *See table(s) above for measurements and observations. Electronically signed by Sherald Hess MD on 11/05/2022 at 4:20:56 PM.    Final    Recent Labs    11/05/22 0555  WBC 7.1  HGB 11.0*  HCT 32.8*  PLT 180   Recent Labs    11/05/22 0555  NA 137  K 3.6  CL 102  CO2 27  GLUCOSE 120*  BUN 13  CREATININE 0.91  CALCIUM 8.5*    Intake/Output Summary (Last 24 hours) at 11/06/2022 0900 Last data filed at 11/06/2022 0707 Gross per 24 hour  Intake 295 ml  Output 1425 ml  Net -1130 ml        Physical Exam: Vital Signs Blood pressure (!) 153/79, pulse 71, temperature 98.4 F (36.9 C), resp. rate 16, height  (1.778 m), weight 80.3 kg, SpO2 99 %.     General: awake, alert, appropriate, blind- sitting up in bed eating breakfast- >50% eaten;  NAD HENT: white lenses; oropharynx moist CV: regular rate; no JVD Pulmonary: CTA B/L; no W/R/R- good air movement GI: soft, NT, ND, (+)BS Psychiatric: appropriate Neurological: Ox3  Musculoskeletal:      Cervical back: Neck supple. No tenderness.     Right lower leg: No edema.     Left lower leg: No edema.     Comments: Ue's 5/5 B/L RLE- 5-/5 except HF on R 4+/5 LLE- 5-5/5 except L HF 4+/5  Skin:    General: Skin is warm and dry.     Comments: PIV in dorsum of each hand without erythema Lumbar incision and L groin/abd incision with honeycomb dressing in place- look OK  Neurological:     Mental Status: He is alert and oriented to person, place, and time.     Comments: Intact to light touch in Ue's and torso L1 numbness and tingling L>R; toherwise intact to light touch in all 4 extremities   Assessment/Plan: 1. Functional deficits which require 3+ hours per day of interdisciplinary therapy in a comprehensive inpatient rehab setting. Physiatrist is providing close team supervision and 24 hour management of active medical problems listed below. Physiatrist and rehab team continue to assess barriers to discharge/monitor patient progress toward functional and medical goals  Care Tool:  Bathing    Body parts bathed by patient: Right arm, Left arm, Chest, Abdomen, Front perineal area, Buttocks, Right upper leg, Left upper leg, Face   Body parts bathed by helper: Right lower leg, Left lower leg     Bathing assist Assist Level: Minimal Assistance - Patient >  75%     Upper Body Dressing/Undressing Upper body dressing   What is the patient wearing?: Pull over shirt, Orthosis Orthosis activity level: Performed by helper  Upper body assist Assist Level: Minimal Assistance - Patient > 75%    Lower Body Dressing/Undressing Lower body dressing      What is the patient wearing?: Pants, Underwear/pull up     Lower body assist Assist for lower body dressing: Moderate Assistance - Patient 50 - 74%     Toileting Toileting    Toileting assist Assist for toileting: Minimal Assistance - Patient > 75%     Transfers Chair/bed transfer  Transfers assist     Chair/bed transfer assist  level: Minimal Assistance - Patient > 75%     Locomotion Ambulation   Ambulation assist      Assist level: Minimal Assistance - Patient > 75% Assistive device: Walker-rolling Max distance: 67   Walk 10 feet activity   Assist     Assist level: Minimal Assistance - Patient > 75% Assistive device: Walker-rolling   Walk 50 feet activity   Assist Walk 50 feet with 2 turns activity did not occur: Safety/medical concerns (2/2 endurance)  Assist level: Minimal Assistance - Patient > 75% Assistive device: Walker-rolling    Walk 150 feet activity   Assist Walk 150 feet activity did not occur: Safety/medical concerns (2/2 endurance)         Walk 10 feet on uneven surface  activity   Assist Walk 10 feet on uneven surfaces activity did not occur: Safety/medical concerns (2/2 endurance)         Wheelchair     Assist Is the patient using a wheelchair?: No   Wheelchair activity did not occur: N/A         Wheelchair 50 feet with 2 turns activity    Assist    Wheelchair 50 feet with 2 turns activity did not occur: N/A       Wheelchair 150 feet activity     Assist  Wheelchair 150 feet activity did not occur: N/A       Blood pressure (!) 153/79, pulse 71, temperature 98.4 F (36.9 C), resp. rate 16, height 5\' 10"  (1.778 m), weight 80.3 kg, SpO2 99 %.  Medical Problem List and Plan: 1. Functional deficits secondary to lumbar myelopathy             -patient may  shower if cover incisions             -ELOS/Goals: 7-10 mod I   Con't CIR -PT and OT-wants a different meal 2.  Antithrombotics: -DVT/anticoagulation:  Mechanical:  Antiembolism stockings, knee (TED hose) Bilateral lower extremities  4/24- will check on when can start Lovenox             -antiplatelet therapy: none             -follow up BLE venous duplex   3. Pain Management: Tylenol, baclofen, Robaxin, oxycodone as needed   4/24- pain well controlled this AM- if gets in right  position- con't meds 4. Mood/Behavior/Sleep: LCSW to evaluate and provide emotional support             -antipsychotic agents: n/a   5. Neuropsych/cognition: This patient is capable of making decisions on his own behalf.   6. Skin/Wound Care: Routine skin care checks   7. 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   11: DM-2: CBGs four times daily; carb modified diet; A!c 6.2% on 4/09             -continue metformin 500 mg with supper             -continue SSI   4/25- BG's controlled- con't regimen 12: Glaucoma: continue gtts X 4  4/24- got contacted to restart drops- did   13: History of gout: no reports of flare   14: Urinary hesitancy/frequency- will check PVRs/bladder scans   4/25- Will use condom cath since he cannot use urinal due to blindness 15: Constipation: continue regimen; PRNs ordered  4/24- LBM 2 days ago- if no BM by tomorrow, will intervene  4/25- LBM yesterday- denies more constipaiton          LOS: 2 days A FACE TO FACE EVALUATION WAS PERFORMED  Brian Daniels 11/06/2022, 9:00 AM

## 2022-11-06 NOTE — Progress Notes (Signed)
Physical Therapy Session Note  Patient Details  Name: Brian Daniels MRN: [id number] Date of Birth: 05-14-1956  Today's Date: 11/06/2022 PT Individual Time: 7829-5621 PT Individual Time Calculation (min): 60 min   Short Term Goals: Week 1:  PT Short Term Goal 1 (Week 1): STG=LTG 2/2 LOS  Skilled Therapeutic Interventions/Progress Updates:    Pt seated in w/c on arrival and agreeable to therapy. Pt requested to brush his teeth, did so in standing with set up A. Rest of session focused on gait training for endurance and functional mobility. Pt ambulated with RW and CGA-min A, verbal cues for navigation, upright posture, which pt could correct when cued. Noted gait deviations only when fatigued. Pt ambulated 120 ft and ~250 ft in this manner. Pt then returned to room and performed 2 x 10 Sit to stand with RW and min A fading to supervision with cueing for anterior weight shift. Pt remained in chair and was left with all needs in reach and alarm active.   Therapy Documentation Precautions:  Precautions Precautions: Fall, Back Precaution Booklet Issued: No Precaution Comments: legally blind; able to ambulate to bathroom without brace. Brace off in bed. Required Braces or Orthoses: Spinal Brace Spinal Brace: Lumbar corset, Applied in sitting position Restrictions Weight Bearing Restrictions: No General:       Therapy/Group: Individual Therapy  [name] 11/06/2022, 4:15 PM

## 2022-11-06 NOTE — Progress Notes (Signed)
Met with patient. Oriented to rehab and team meetings every Tuesday. SW will f/u with discharge date, goals, barriers, and equipment needs. Reports that incontinence has been a lot more since his surgery and has only had 2 BM since his admission. Reports that when he lies down as night doesn't have an issue but when stands, urine just starts coming out. He agrees that if staff comes in, he will be more continent. Reports that would rather get up to bathroom than to be incontinent. Will work on BM. Reports that can don/doff lumbar corset. Reports that his sister checks his blood sugar about once a week. Suggested was to improve A1C. Increase protein. Taking blood pressure medications at home. Therapy in room for session.  All needs met.

## 2022-11-06 NOTE — Progress Notes (Signed)
Occupational Therapy Session Note  Patient Details  Name: Brian Daniels MRN: [id number] Date of Birth: 05-12-1956  Today's Date: 11/07/2022 OT Individual Time: 1105-1203 OT Individual Time Calculation (min): 58 min    Short Term Goals: Week 1:  OT Short Term Goal 1 (Week 1): STG = LTGS (d/t ELOS)  Skilled Therapeutic Interventions/Progress Updates:  Pt received sitting in [facility] for skilled OT session with focus on functional transfers, DME recommendations, and functional mobility at household level. Pt agreeable to interventions, demonstrating overall pleasant mood. Pt with un-rated pain, stating "I took some medication this morning for it." OT offering intermediate rest breaks and positioning suggestions throughout session to address pain/fatigue and maximize participation/safety in session.   Pt performs multiple STS transfers this session with CGA + RW, requiring cuing for safe hand placement. Pt ambulates from room>4W elevators with CGA + RW, dependent for remainder of transport to ADL apartment. In ADL apartment, pt and OT discuss home-bathroom setup and current DME. Pt has shower chair, long-handled shower head, but no grab bars. Pt performs TTB transfer with A required for BLE elevation due to increased weakness. Pt then ambulates to main therapy gym, in which patient is educated on use of reacher for LB dressing, threading looped theraband to simulate task. Pt successfully completes with increased time due to newness of equipment, pt's sister shares she has been threading LB garments PTA.   Pt with notable BLE weakness noted, L>R, requiring cuing to pick up his L-leg with fatigue during ambulation.   Pt remained siting in recliner with all immediate needs met at end of session. Pt continues to be appropriate for skilled OT intervention to promote further functional independence.   Therapy Documentation Precautions:  Precautions Precautions: Fall, Back Precaution Booklet Issued:  No Precaution Comments: legally blind; able to ambulate to bathroom without brace. Brace off in bed. Required Braces or Orthoses: Spinal Brace Spinal Brace: Lumbar corset, Applied in sitting position Restrictions Weight Bearing Restrictions: No    Therapy/Group: Individual Therapy  Lou Cal, OTR/L, MSOT  11/07/2022, 12:21 PM

## 2022-11-06 NOTE — Progress Notes (Signed)
Patient ID: Brian Daniels, male   DOB: 1955/08/23, 67 y.o.   MRN: [id number]   1204- SW left message for pt sister Brian Daniels to introduce self, explain role, discuss discharge process, and inform on ELOS.   1233- SW received call from pt sister Brian Daniels and reports she is here. SW will come by room to meet.   1330-SW went by pt room to complete assessment and meet with pt but not in room. SW left statement of service on bed. SW will follow-up to complete.   *SW later spoke with pt sister and reports in therapy with pt. SW will come by after therapy session.   Cecile Sheerer, MSW, LCSWA Office: [phone] Cell: [phone] Fax: [phone]

## 2022-11-06 NOTE — Progress Notes (Signed)
Occupational Therapy Session Note  Patient Details  Name: Brian Daniels MRN: [id number] Date of Birth: 17-Sep-1955  Today's Date: 11/06/2022 OT Individual Time: 4098-1191 OT Individual Time Calculation (min): 76 min    Short Term Goals: Week 1:  OT Short Term Goal 1 (Week 1): STG = LTGS (d/t ELOS)  Skilled Therapeutic Interventions/Progress Updates:    Patient agreeable to participate in OT session. Facial grimacing during sit to stands reporting pain in hips. No pain provided. Monitored during session.   Patient participated in skilled OT session focusing on ADL re-training and functional mobility in order to improve functional performance and independence with basic ADL tasks.   With bed completely flat, pt was educated on log roll technique with Min physical assist and VC provided to bend left knee and keep right leg straight. Reach for bed rail with left hand while rolling to side. Min A provided to transition from sidelying to sitting EOB. Sit to stand completed with Min A from elevated surface. Pt completed functional mobility from bed to toilet. Unable to void or have a BM. Once standing and walking towards shower, pt verbalized urgent need to urinate. Therapist provided set -up of urinal and pt was able to void.  Provided verbal cues for environment and Min guard assist, pt walked from toilet to shower using RW until able to reach grab bars. Lateral side steps completed both left to enter shower and to the right to exit when finished.  - UB bathing completed with Set-up. LB bathing completed with Mod A in order to maintain back precautions and due to visual deficits. With Min guard pt utilized RW to complete functional mobility from shower to w/c in room. Completed UB dressing including back brace with Min A. LB dressing completed with Mod assist including foley catheter management through depends and pants. Nursing arrived in room at end of session to complete Skin Check. They were made  aware of 2 blisters on pt's back near honeycomb dressing.      Therapy Documentation Precautions:  Precautions Precautions: Fall, Back Precaution Booklet Issued: No Precaution Comments: legally blind; able to ambulate to bathroom without brace. Brace off in bed. Required Braces or Orthoses: Spinal Brace Spinal Brace: Lumbar corset, Applied in sitting position Restrictions Weight Bearing Restrictions: No   Therapy/Group: Individual Therapy  Limmie Patricia, OTR/L,CBIS  Supplemental OT - [facility] and [facility] Secure Chat Preferred   11/06/2022, 10:21 AM

## 2022-11-06 NOTE — Progress Notes (Signed)
Physical Therapy Session Note  Patient Details  Name: Brian Daniels MRN: [id number] Date of Birth: December 30, 1955  Today's Date: 11/06/2022 PT Individual Time: 1305-1420 PT Individual Time Calculation (min): 75 min   Short Term Goals: Week 1:  PT Short Term Goal 1 (Week 1): STG=LTG 2/2 LOS  Skilled Therapeutic Interventions/Progress Updates: Pt presented in recliner agreeable to therapy with sister present throughout. Upon entry to room RN present for wound assessment. Pt noted to have blisters near incision areas. During inspection discussed not wearing LSO when resting in recliner nor in bed to avoid shearing on blisters. PTA placed sign above pt's bed for communication. Once assessment completed PTA assisted in donning LSO and pt was able to secure and tighten with supervision. Pt then ambulated with CGA (minA for directional cues) overall to main gym >290ft with w/c follow for safety. Pt noted to ambulate with shortened step length, decreased cadence, and narrow BOS. Pt also noted to have R foot externally rotated. In main gym discussed with pt and sister set up for home entry. PTA then demonstrated to pt's sister , then had pt performed step up with RW to 5in step. PTA assisted with RW management and pt was able to complete with minA for RW management. Once completed pt rested at mat and then participated in Sit to stand with RLE on 2in step without AD for increased recruitment of LLE. Pt required minA for both powering up as well as balance due to heavy L lean upon standing. On first 2-3 round pt c/o increased L flank pain, upon palpation noted more at incision area. Provided education on maintaining midline when performing transition to stand with pt able to perform with decreased pain. Pt then ambulated to parallel bars and PTA set up with red theraband at ankles. Performed side stepping with theraband 22ft x 4 each direction for increased glute med recruitment. Pt then transferred to w/c and  transported back to room for time management. Performed ambulaotry transfer to recliner and left seated at end of session with call bell within reach and NT present for vitals assessment.      Therapy Documentation Precautions:  Precautions Precautions: Fall, Back Precaution Booklet Issued: No Precaution Comments: legally blind; able to ambulate to bathroom without brace. Brace off in bed. Required Braces or Orthoses: Spinal Brace Spinal Brace: Lumbar corset, Applied in sitting position Restrictions Weight Bearing Restrictions: No General:   Vital Signs: Therapy Vitals Temp: 98.1 F (36.7 C) Pulse Rate: 77 Resp: 17 BP: 126/73 Patient Position (if appropriate): Sitting Oxygen Therapy SpO2: 100 % O2 Device: Room Air Pain: Pain Assessment Pain Scale: 0-10 Pain Score: 5  Pain Type: Acute pain;Surgical pain Pain Location: Back Pain Orientation: Lower;Left Pain Radiating Towards: flank Pain Descriptors / Indicators: Aching Pain Frequency: Intermittent Pain Onset: On-going Pain Intervention(s): Medication (See eMAR) Mobility:   Locomotion :    Trunk/Postural Assessment :    Balance:   Exercises:   Other Treatments:      Therapy/Group: Individual Therapy  Allyson Tineo 11/06/2022, 4:18 PM

## 2022-11-06 NOTE — Care Management (Signed)
[facility]patient Rehabilitation [facility] Individual Statement of Services  Patient Name:  Brian Daniels  Date:  11/06/2022  Welcome to the Inpatient Rehabilitation Center.  Our goal is to provide you with an individualized program based on your diagnosis and situation, designed to meet your specific needs.  With this comprehensive rehabilitation program, you will be expected to participate in at least 3 hours of rehabilitation therapies Monday-Friday, with modified therapy programming on the weekends.  Your rehabilitation program will include the following services:  Physical Therapy (PT), Occupational Therapy (OT), 24 hour per day rehabilitation nursing, Therapeutic Recreaction (TR), Psychology, Neuropsychology, Care Coordinator, Rehabilitation Medicine, Nutrition Services, Pharmacy Services, and Other  Weekly team conferences will be held on Tuesdays to discuss your progress.  Your Inpatient Rehabilitation Care Coordinator will talk with you frequently to get your input and to update you on team discussions.  Team conferences with you and your family in attendance may also be held.  Expected length of stay: 7-10 days    Overall anticipated outcome: Supervision  Depending on your progress and recovery, your program may change. Your Inpatient Rehabilitation Care Coordinator will coordinate services and will keep you informed of any changes. Your Inpatient Rehabilitation Care Coordinator's name and contact numbers are listed  below.  The following services may also be recommended but are not provided by the Inpatient Rehabilitation Center:  Driving Evaluations Home Health Rehabiltiation Services Outpatient Rehabilitation Services Vocational Rehabilitation   Arrangements will be made to provide these services after discharge if needed.  Arrangements include referral to agencies that provide these services.  Your insurance has been verified to be:  [employer]  Your primary doctor is:  Knox Royalty  Pertinent information will be shared with your doctor and your insurance company.  Inpatient Rehabilitation Care Coordinator:  Susie Cassette [phone] or ([phone]  Information discussed with and copy given to patient by: Gretchen Short, 11/06/2022, 12:01 PM

## 2022-11-07 DIAGNOSIS — G959 Disease of spinal cord, unspecified: Secondary | ICD-10-CM | POA: Diagnosis not present

## 2022-11-07 LAB — GLUCOSE, CAPILLARY
Glucose-Capillary: 91 mg/dL (ref 70–99)
Glucose-Capillary: 88 mg/dL (ref 70–99)
Glucose-Capillary: 90 mg/dL (ref 70–99)
Glucose-Capillary: 110 mg/dL — ABNORMAL HIGH (ref 70–99)

## 2022-11-07 NOTE — Progress Notes (Signed)
Inpatient Rehabilitation Care Coordinator Assessment and Plan Patient Details  Name: Brian Daniels MRN: [id number] Date of Birth: [birth year]  Today's Date: 11/07/2022  Hospital Problems: Principal Problem:   Lumbar myelopathy [facility])  Past Medical History:  Past Medical History:  Diagnosis Date   Cataracts, bilateral    removed by surgery   Diabetes mellitus without complication (HCC)    type 2   Glaucoma    bilateral   Gout    Heart murmur    several years ago   Hypercholesteremia    Hypertension    Left atrial enlargement    Legally blind    bilateral   Neuropathy    hands, arm   Numbness    hands, arms   Wears partial dentures    lower   Wheelchair bound    since 05/30/19   Past Surgical History:  Past Surgical History:  Procedure Laterality Date   ANTERIOR CERVICAL DECOMPRESSION/DISCECTOMY FUSION 4 LEVELS N/A 08/05/2019   Procedure: ANTERIOR CERVICAL DECOMPRESSION/DISCECTOMY FUSION CERVICAL THREE- CERVICAL FOUR, CERVICAL FOUR- CERVICAL FIVE, CERVICAL FIVE- CERVICAL SIX, CERVICAL SIX- CERVICAL SEVEN;  Surgeon: Lisbeth Renshaw, MD;  Location: [facility] OR;  Service: Neurosurgery;  Laterality: N/A;  anterior   COLONOSCOPY     EYE SURGERY Bilateral    multiple surgeries bilateral -    MULTIPLE TOOTH EXTRACTIONS     WISDOM TOOTH EXTRACTION     Social History:  reports that he has never smoked. He has never used smokeless tobacco. He reports that he does not currently use alcohol. He reports that he does not use drugs.  Family / Support Systems Marital Status: Divorced How Long?: 18 years Spouse/Significant Other: Divorced Children: no children Other Supports: None reported Anticipated Caregiver: sister Brian Daniels Ability/Limitations of Caregiver: None Caregiver Availability: 24/7 Family Dynamics: Pt lives with his sister Brian Daniels. He has been Airline pilot since 2019.  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); no VA benefits at this time Education: high school graduate Health Literacy - How often do you need to have someone help you when you read instructions, pamphlets, or other written material from your doctor or pharmacy?: Never Writes: Yes Employment Status: Disabled Marine scientist Issues: Denies Guardian/Conservator: N/A   Abuse/Neglect Abuse/Neglect Assessment Can Be Completed: Yes Physical Abuse: Denies Verbal Abuse: Denies Sexual Abuse: Denies Exploitation of patient/patient's resources: Denies Self-Neglect: Denies  Patient response to: Social Isolation - How often do you feel lonely or isolated from those around you?: Never  Emotional Status Pt's affect, behavior and adjustment status: Pt in good spirits at time of visit Recent Psychosocial Issues: Denies Psychiatric History: Denies Substance Abuse History: Pt reports quit smoking and drinking in 2006 after being hospitalized. Denies rec drug use/  Patient / Family Perceptions, Expectations & Goals Pt/Family understanding of illness & functional limitations: Pt and sister have a general understanding of pt care needs Premorbid pt/family roles/activities: Assistance with ADLs/IADLs Anticipated changes in roles/activities/participation: continued assistance witH ADLs/IADLs Pt/family expectations/goals: Pt goal is to work on being abl to stand up straight and walk better. Sister has same goals.  Community Resources [employer]: None Premorbid Home Care/DME Agencies: None Transportation available at discharge: Sister Is the patient able to respond to transportation needs?: Yes In the past 12 months, has lack of transportation kept you from medical appointments or from getting medications?: No In the past 12 months, has lack of transportation kept you from meetings, work, or from getting things needed  for daily living?: No Resource referrals recommended: Neuropsychology  Discharge Planning Living  Arrangements: Other relatives Support Systems: Other relatives Type of Residence: Private residence Insurance Resources: Media planner (specify) (BCBS Medicare) Financial Resources: Restaurant manager, fast food Screen Referred: No Living Expenses: Lives with family Money Management: Family Does the patient have any problems obtaining your medications?: No Home Management: Pt sister manages all home care needs Patient/Family Preliminary Plans: No changes Care Coordinator Barriers to Discharge: Neurogenic Bowel & Bladder, Decreased caregiver support, Lack of/limited family support Care Coordinator Anticipated Follow Up Needs: HH/OP Expected length of stay: 7-10 days  Clinical Impression SW met with pt and pt sister in room to introduce self, explain role, and discuss discharge process. No HCPOA formal paperwork, but it is his sister. DME: shower chair, w/c, cane, rollator. States he will need 3in1 BSC. SW shared will provide all final recommendations once informed by therapy. SW will continue to provide update as available.   [name] A [name] 11/07/2022, 3:55 PM

## 2022-11-07 NOTE — Plan of Care (Signed)
  Problem: RH BOWEL ELIMINATION Goal: RH STG MANAGE BOWEL WITH ASSISTANCE Description: STG Manage Bowel with mod I  Assistance. Outcome: Not Progressing; LBM 4/24 laxatives given   Problem: RH BLADDER ELIMINATION Goal: RH STG MANAGE BLADDER WITH ASSISTANCE Description: STG Manage Bladder With toileting Assistance Outcome: Not Progressing; Condom cath with orders   Problem: RH SAFETY Goal: RH STG ADHERE TO SAFETY PRECAUTIONS W/ASSISTANCE/DEVICE Description: STG Adhere to Safety Precautions With cues Assistance/Device. Outcome: Progressing Problem: RH SKIN INTEGRITY Goal: RH STG ABLE TO PERFORM INCISION/WOUND CARE W/ASSISTANCE Description: STG Able To Perform Incision/Wound Care With min Assistance. Outcome: Progressing; new orders for incision

## 2022-11-07 NOTE — Progress Notes (Signed)
Physical Therapy Session Note  Patient Details  Name: Brian Daniels MRN: [id number] Date of Birth: 1956-01-23  Today's Date: 11/07/2022 PT Individual Time: 0800-0900, 4098-1191 PT Individual Time Calculation (min): 60 min, 30 min   Short Term Goals: Week 1:  PT Short Term Goal 1 (Week 1): STG=LTG 2/2 LOS  Skilled Therapeutic Interventions/Progress Updates:    Session 1: pt received in bed and agreeable to therapy. Supine>sit with supervision. Found honey comb dressing to peeled up, so consulted with RN, who assessed and removed dressing. Donned brace EOB with assist. Session focused on ambulation practice, distances 100-200 ft. Pt ambulated with CGA and RW, chair follow for safety as pt presents with morning stiffness which limited first gait trial. Also note flexed hip and knee posture. Assessed HS flexibility, and pt can achieve full range. Would benefit from assessing thomas test, but limited by time this session. Frequent cues for improved upright posture and step length. Pt also participated in several minutes on kinetron at 20 cm/sec for reciprocal motion training and hamstring activation. Pt returned to room and remained in w/c,was left with all needs in reach and alarm active.   Session 2: Pt recd in w/c and requested to use bathroom. ambulatory transfer with CGA and RW. Pt able to manage clothing with CGA-min A for navigating around condom cath. Tot a for hygiene. Pt then stood for several minutes while NT changed condom cath. Pt was able to ambulate to sink, but when cued to lean on sink to wash hands, pt had incidence of near knee buckling and finished task in sitting. Pt remained in w/c at end of session and remained with all needs in reach and his sister present.   Therapy Documentation Precautions:  Precautions Precautions: Fall, Back Precaution Booklet Issued: No Precaution Comments: legally blind; able to ambulate to bathroom without brace. Brace off in bed. Required Braces  or Orthoses: Spinal Brace Spinal Brace: Lumbar corset, Applied in sitting position Restrictions Weight Bearing Restrictions: No General:       Therapy/Group: Individual Therapy  [name] 11/07/2022, 3:47 PM

## 2022-11-07 NOTE — Progress Notes (Signed)
PROGRESS NOTE   Subjective/Complaints:  Didn't get coffee today  Pain 5/10- just got pain meds- haven't kicked in yet  LBM 2 days ago- passed gas yesterday- thought had bowel accident but just gas. Per nursing, has blisters on flank and back- mid back- around/near incision  ROS:   Pt denies SOB, abd pain, CP, N/V/C/D, and vision changes    Objective:   VAS [place] LOWER EXTREMITY VENOUS (DVT)  Result Date: 11/05/2022  Lower Venous DVT Study Patient Name:  ADEMOLA VERT  Date of Exam:   11/05/2022 Medical Rec #: [id number]        Accession #:    [id number] Date of Birth: [birth year]        Patient Gender: M Patient Age:   67 years Exam Location:  [facility] Procedure:      VAS [place] LOWER EXTREMITY VENOUS (DVT) Referring Phys: Wendi Maya --------------------------------------------------------------------------------  Indications: Swelling, and S/P lumbar laminectomy 10/31/22.  Comparison Study: Previous 08/10/19 negative. Performing Technologist: McKayla Maag RVT, VT  Examination Guidelines: A complete evaluation includes B-mode imaging, spectral Doppler, color Doppler, and power Doppler as needed of all accessible portions of each vessel. Bilateral testing is considered an integral part of a complete examination. Limited examinations for reoccurring indications may be performed as noted. The reflux portion of the exam is performed with the patient in reverse Trendelenburg.  +---------+---------------+---------+-----------+----------+--------------+ RIGHT    CompressibilityPhasicitySpontaneityPropertiesThrombus Aging +---------+---------------+---------+-----------+----------+--------------+ CFV      Full           Yes      Yes                                 +---------+---------------+---------+-----------+----------+--------------+ SFJ      Full                                                         +---------+---------------+---------+-----------+----------+--------------+ FV Prox  Full                                                        +---------+---------------+---------+-----------+----------+--------------+ FV Mid   Full                                                        +---------+---------------+---------+-----------+----------+--------------+ FV DistalFull                                                        +---------+---------------+---------+-----------+----------+--------------+  PFV      Full                                                        +---------+---------------+---------+-----------+----------+--------------+ POP      Full           Yes      Yes                                 +---------+---------------+---------+-----------+----------+--------------+ PTV      Full                                                        +---------+---------------+---------+-----------+----------+--------------+ PERO     Full                                                        +---------+---------------+---------+-----------+----------+--------------+   +---------+---------------+---------+-----------+----------+--------------+ LEFT     CompressibilityPhasicitySpontaneityPropertiesThrombus Aging +---------+---------------+---------+-----------+----------+--------------+ CFV      Full           Yes      Yes                                 +---------+---------------+---------+-----------+----------+--------------+ SFJ      Full                                                        +---------+---------------+---------+-----------+----------+--------------+ FV Prox  Full                                                        +---------+---------------+---------+-----------+----------+--------------+ FV Mid   Full                                                         +---------+---------------+---------+-----------+----------+--------------+ FV DistalFull                                                        +---------+---------------+---------+-----------+----------+--------------+ PFV      Full                                                        +---------+---------------+---------+-----------+----------+--------------+  POP      Full           Yes      Yes                                 +---------+---------------+---------+-----------+----------+--------------+ PTV      Full                                                        +---------+---------------+---------+-----------+----------+--------------+ PERO     Full                                                        +---------+---------------+---------+-----------+----------+--------------+     Summary: BILATERAL: - No evidence of deep vein thrombosis seen in the lower extremities, bilaterally. - No evidence of superficial venous thrombosis in the lower extremities, bilaterally. -No evidence of popliteal cyst, bilaterally.   *See table(s) above for measurements and observations. Electronically signed by Sherald Hess MD on 11/05/2022 at 4:20:56 PM.    Final    Recent Labs    11/05/22 0555  WBC 7.1  HGB 11.0*  HCT 32.8*  PLT 180   Recent Labs    11/05/22 0555  NA 137  K 3.6  CL 102  CO2 27  GLUCOSE 120*  BUN 13  CREATININE 0.91  CALCIUM 8.5*    Intake/Output Summary (Last 24 hours) at 11/07/2022 0903 Last data filed at 11/07/2022 0515 Gross per 24 hour  Intake 596 ml  Output 1650 ml  Net -1054 ml     Pressure Injury 11/04/22 Back Left;Medial Stage 2 -  Partial thickness loss of dermis presenting as a shallow open injury with a red, pink wound bed without slough. blister/ device related (Active)  11/04/22 1440  Location: Back  Location Orientation: Left;Medial  Staging: Stage 2 -  Partial thickness loss of dermis presenting as a shallow open injury  with a red, pink wound bed without slough.  Wound Description (Comments): blister/ device related  Present on Admission: Yes     Pressure Injury 11/04/22 Lumbar Lateral;Right Stage 2 -  Partial thickness loss of dermis presenting as a shallow open injury with a red, pink wound bed without slough. blister/ device related (Active)  11/04/22 1340  Location: Lumbar  Location Orientation: Lateral;Right  Staging: Stage 2 -  Partial thickness loss of dermis presenting as a shallow open injury with a red, pink wound bed without slough.  Wound Description (Comments): blister/ device related  Present on Admission: Yes    Physical Exam: Vital Signs Blood pressure (!) 142/70, pulse 75, temperature 98.6 F (37 C), temperature source Oral, resp. rate 16, height 5\' 10"  (1.778 m), weight 80.3 kg, SpO2 99 %.      General: awake, alert, appropriate, blind, NAD HENT: whitish lenses; oropharynx moist CV: regular rate; no JVD Pulmonary: CTA B/L; no W/R/R- good air movement GI: soft, NT, ND, (+)BS- hypoactive Psychiatric: appropriate Neurological: Ox3 Skin; popped blister midback- still oozing; L flank still intact 2x1 cm- staples intact on back honeycomb came off Musculoskeletal:     Cervical back: Neck supple.  No tenderness.     Right lower leg: No edema.     Left lower leg: No edema.     Comments: Ue's 5/5 B/L RLE- 5-/5 except HF on R 4+/5 LLE- 5-5/5 except L HF 4+/5  Skin:    General: Skin is warm and dry.     Comments: PIV in dorsum of each hand without erythema Lumbar incision and L groin/abd incision with honeycomb dressing in place- look OK  Neurological:     Mental Status: He is alert and oriented to person, place, and time.     Comments: Intact to light touch in Ue's and torso L1 numbness and tingling L>R; toherwise intact to light touch in all 4 extremities   Assessment/Plan: 1. Functional deficits which require 3+ hours per day of interdisciplinary therapy in a comprehensive  inpatient rehab setting. Physiatrist is providing close team supervision and 24 hour management of active medical problems listed below. Physiatrist and rehab team continue to assess barriers to discharge/monitor patient progress toward functional and medical goals  Care Tool:  Bathing    Body parts bathed by patient: Right arm, Left arm, Chest, Abdomen, Front perineal area, Buttocks, Right upper leg, Left upper leg, Face   Body parts bathed by helper: Right lower leg, Left lower leg     Bathing assist Assist Level: Minimal Assistance - Patient > 75%     Upper Body Dressing/Undressing Upper body dressing   What is the patient wearing?: Pull over shirt, Orthosis Orthosis activity level: Performed by helper  Upper body assist Assist Level: Minimal Assistance - Patient > 75%    Lower Body Dressing/Undressing Lower body dressing      What is the patient wearing?: Pants, Underwear/pull up     Lower body assist Assist for lower body dressing: Moderate Assistance - Patient 50 - 74%     Toileting Toileting    Toileting assist Assist for toileting: Minimal Assistance - Patient > 75%     Transfers Chair/bed transfer  Transfers assist     Chair/bed transfer assist level: Minimal Assistance - Patient > 75%     Locomotion Ambulation   Ambulation assist      Assist level: Minimal Assistance - Patient > 75% Assistive device: Walker-rolling Max distance: 67   Walk 10 feet activity   Assist     Assist level: Minimal Assistance - Patient > 75% Assistive device: Walker-rolling   Walk 50 feet activity   Assist Walk 50 feet with 2 turns activity did not occur: Safety/medical concerns (2/2 endurance)  Assist level: Minimal Assistance - Patient > 75% Assistive device: Walker-rolling    Walk 150 feet activity   Assist Walk 150 feet activity did not occur: Safety/medical concerns (2/2 endurance)         Walk 10 feet on uneven surface  activity   Assist  Walk 10 feet on uneven surfaces activity did not occur: Safety/medical concerns (2/2 endurance)         Wheelchair     Assist Is the patient using a wheelchair?: No   Wheelchair activity did not occur: N/A         Wheelchair 50 feet with 2 turns activity    Assist    Wheelchair 50 feet with 2 turns activity did not occur: N/A       Wheelchair 150 feet activity     Assist  Wheelchair 150 feet activity did not occur: N/A       Blood pressure (!) 142/70, pulse 75,  temperature 98.6 F (37 C), temperature source Oral, resp. rate 16, height 5\' 10"  (1.778 m), weight 80.3 kg, SpO2 99 %.  Medical Problem List and Plan: 1. Functional deficits secondary to lumbar myelopathy             -patient may  shower if cover incisions             -ELOS/Goals: 7-10 mod I   Con't CIR PT and OT- IPOC today 2.  Antithrombotics: -DVT/anticoagulation:  Mechanical:  Antiembolism stockings, knee (TED hose) Bilateral lower extremities  4/24- will check on when can start Lovenox  4.26- should be able to start Lovenox tomorrow             -antiplatelet therapy: none             -follow up BLE venous duplex   3. Pain Management: Tylenol, baclofen, Robaxin, oxycodone as needed   4/24- pain well controlled this AM- if gets in right position- con't meds  4/26- Pain max 5/10- just got meds- so feeling better soon per pt 4. Mood/Behavior/Sleep: LCSW to evaluate and provide emotional support             -antipsychotic agents: n/a   5. Neuropsych/cognition: This patient is capable of making decisions on his own behalf.   6. Skin/Wound Care: Routine skin care checks   7. Fluids/Electrolytes/Nutrition: Routine Is and Os and follow-up chemistries   8: Hypertension: monitor TID and prn             -continue amlodipine 5 mg daily             -continue benazepril 10 mg daily             -continue metroprolol succinate 25 mg daily   4/25- BP controlled- con't regimen 9: Hyperlipidemia:  continue statin   10: Legally blind- from glaucoma   11: DM-2: CBGs four times daily; carb modified diet; A!c 6.2% on 4/09             -continue metformin 500 mg with supper             -continue SSI   4/25- BG's controlled- con't regimen 12: Glaucoma: continue gtts X 4  4/24- got contacted to restart drops- did   13: History of gout: no reports of flare   14: Urinary hesitancy/frequency- will check PVRs/bladder scans   4/25- Will use condom cath since he cannot use urinal due to blindness 15: Constipation: continue regimen; PRNs ordered  4/24- LBM 2 days ago- if no BM by tomorrow, will intervene  4/25- LBM yesterday- denies more constipaiton  4/26- LBM 2 days ago- has been refusing bowel meds- passed gas and thought had incontinent BM yesterday- only gas- asked him to take bowel meds today but will wait to give Miralax til noon.   16. Blisters/stage II back/L flank  4/26- pt's back blister popped this AM; other one intact- will con't foam dressing and dry dressing on back- honeycomb came off- sutures intact- looks good   I spent a total of  35  minutes on total care today- >50% coordination of care- due to  [facility] and d/w nursing about wounds as well as bowels      LOS: 3 days A FACE TO FACE EVALUATION WAS PERFORMED  Alaysia Lightle 11/07/2022, 9:03 AM

## 2022-11-07 NOTE — IPOC Note (Signed)
Overall Plan of Care [facility]) Patient Details Name: Brian Daniels MRN: [id number] DOB: 05-16-1956  Admitting Diagnosis: Lumbar myelopathy [facility])  Hospital Problems: Principal Problem:   Lumbar myelopathy (HCC)     Functional Problem List: Nursing Bladder, Pain, Bowel, Safety, Endurance, Medication Management  PT Balance, Endurance, Safety, Pain  OT Balance, Sensory, Endurance, Motor, Pain  SLP    TR         Basic ADL's: OT Grooming, Bathing, Dressing, Toileting     Advanced  ADL's: OT       Transfers: PT Bed Mobility, Bed to Chair, Car  OT Toilet, Tub/Shower     Locomotion: PT Ambulation, Stairs     Additional Impairments: OT    SLP        TR      Anticipated Outcomes Item Anticipated Outcome  Self Feeding    Swallowing      Basic self-care  SBA  Toileting  Mod I   Bathroom Transfers SBA  Bowel/Bladder  manage bowel w mod I and bladder with toileting  Transfers  sup + LRAD  Locomotion  sup + LRAD  Communication     Cognition     Pain  < 4 with prns  Safety/Judgment  manage w cues   Therapy Plan: PT Intensity: Minimum of 1-2 x/day ,45 to 90 minutes PT Frequency: 5 out of 7 days PT Duration Estimated Length of Stay: 7-10 days OT Intensity: Minimum of 1-2 x/day, 45 to 90 minutes OT Frequency: 5 out of 7 days OT Duration/Estimated Length of Stay: 7-10 days     Team Interventions: Nursing Interventions Patient/Family Education, Disease Management/Prevention, Discharge Planning, Pain Management, Bladder Management, Bowel Management, Medication Management  PT interventions Ambulation/gait training, Balance/vestibular training, Discharge planning, Disease management/prevention, DME/adaptive equipment instruction, Functional mobility training, Pain management, Patient/family education, Skin care/wound management, Stair training, Therapeutic Activities, Therapeutic Exercise, UE/LE Strength taining/ROM, UE/LE Coordination activities, Visual/perceptual  remediation/compensation  OT Interventions Balance/vestibular training, Neuromuscular re-education, Self Care/advanced ADL retraining, Therapeutic Exercise, UE/LE Strength taining/ROM, Pain management, DME/adaptive equipment instruction, Patient/family education, UE/LE Coordination activities, Discharge planning, Functional mobility training, Therapeutic Activities  SLP Interventions    TR Interventions    SW/CM Interventions Patient/Family Education, Psychosocial Support, Discharge Planning   Barriers to Discharge MD  Medical stability, Home enviroment access/loayout, Wound care, Lack of/limited family support, and Weight bearing restrictions  Nursing Neurogenic Bowel & Bladder, Decreased caregiver support 1 level stoop entry; has DME; needed use of rail and assist for lifting each leg just over edge of bed; assist to reposition trunk once supine PTA and able to get out few times/week  PT Other (comments) needs to be at sup level for caregiver support  OT      SLP      SW Decreased caregiver support, Lack of/limited family support, Community education officer for SNF coverage     Team Discharge Planning: Destination: PT-Home ,OT- Home , SLP-  Projected Follow-up: PT-Home health PT, OT-  None, SLP-  Projected Equipment Needs: PT-To be determined, OT- None recommended by OT, SLP-  Equipment Details: PT- , OT-  Patient/family involved in discharge planning: PT- Patient, Family member/caregiver,  OT-Patient, SLP-   MD ELOS: 7-10 days Medical Rehab Prognosis:  Good Assessment: The patient has been admitted for CIR therapies with the diagnosis of lumbar myelopathy. The team will be addressing functional mobility, strength, stamina, balance, safety, adaptive techniques and equipment, self-care, bowel and bladder mgt, patient and caregiver education, wound care. Goals have been set at [facility]. Anticipated  discharge destination is home.        See Team Conference Notes for weekly updates to the plan of care

## 2022-11-07 NOTE — Progress Notes (Signed)
Occupational Therapy Session Note  Patient Details  Name: Brian Daniels MRN: [id number] Date of Birth: Sep 17, 1955  Today's Date: 11/07/2022 OT Individual Time: 3086-5784 OT Individual Time Calculation (min): 70 min    Short Term Goals: Week 1:  OT Short Term Goal 1 (Week 1): STG = LTGS (d/t ELOS)  Skilled Therapeutic Interventions/Progress Updates:   Pt seen for skilled OT with focus on shower retraining including safe access, standing tolerance and balance, body mechanics and low vision compensatory strategies. Pt up in recliner and sister present bed side. Condom catheter had slid off while pt was resting. OT removed and notified nursing. Pt with slow movement patterns but able to perform sit to stand from recliner with close S. Amb to and from bathroom to shower stall with min cues for directionality and hand placement. OT provided total coverage of waterproofing wrap over back region around abdomen to keep all moisture out. Pt able to perform UB bathing with set up and OT issued and trained in use of LH sponge. Needed min A otherwise. OT applied slipper socks due to activity tolerance and pt requested gown for bed vs re-dressing fully. OT assisted with incontinence brief mngt. Once back at recliner pt able to manage to recline and position self with no reports of pain. Left pt recliner level with all needs, chair alarm and call button in reach.   Pain:   2/10 LB pain only when reaching or sit to stand   Therapy Documentation Precautions:  Precautions Precautions: Fall, Back Precaution Booklet Issued: No Precaution Comments: legally blind; able to ambulate to bathroom without brace. Brace off in bed. Required Braces or Orthoses: Spinal Brace Spinal Brace: Lumbar corset, Applied in sitting position Restrictions Weight Bearing Restrictions: No   Therapy/Group: Individual Therapy  Vicenta Dunning 11/07/2022, 7:36 AM

## 2022-11-08 LAB — GLUCOSE, CAPILLARY
Glucose-Capillary: 92 mg/dL (ref 70–99)
Glucose-Capillary: 127 mg/dL — ABNORMAL HIGH (ref 70–99)
Glucose-Capillary: 109 mg/dL — ABNORMAL HIGH (ref 70–99)
Glucose-Capillary: 97 mg/dL (ref 70–99)

## 2022-11-08 NOTE — Progress Notes (Signed)
Physical Therapy Session Note  Patient Details  Name: Brian Daniels MRN: [id number] Date of Birth: 02-01-1956  Today's Date: 11/08/2022 PT Individual Time: 1000-1045, 1415-1530 PT Individual Time Calculation (min): 45 min, 75 min   Short Term Goals: Week 1:  PT Short Term Goal 1 (Week 1): STG=LTG 2/2 LOS  Skilled Therapeutic Interventions/Progress Updates:    Session 1: Pt received in recliner and agreeable to therapy.  Pt reports pain managed this session. Donned brace with assist. Pt ambulated to day room with CGA and RW. Pt assisted into supine on mat table with min A and increased time using log roll. Performed thomas test, noted positive BIL, with L>R. Stretched hip flexors in supine 3 x 30 sec BIL. Provided education on getting into flat position to prevent muscle shortening. Pt then ambulated 2 x >120 ft with emphasis on hip extension and improving flexed knee posture. Multimodal cueing throughout. Pt with improved ability to maintain upright posture after stretching. Pt returned to recliner and was left with all needs in reach and alarm active.   Session 2: Pt received in recliner and agreeable to therapy.  No complaint of pain. Donned brace with assist. Sit to stand and gait with CGA-supervision this session. Cues for upright posture and hip extension. While preparing to start stair training, pt reports incontinent BM. Returned to room, at to bathroom with CGA. Tot A for hygiene. Pt returned to stairs and performed 3 x 8 with seated rest breaks for endurance and functional mobility. Pt ascended with alternating technique and descended with step to. Cues for neutral rotation of R foot while stepping down. Min A and 2 hand rails throughout. Pt returned to room and to recliner, was left with all needs in reach and alarm active.   Therapy Documentation Precautions:  Precautions Precautions: Fall, Back Precaution Booklet Issued: No Precaution Comments: legally blind; able to ambulate to  bathroom without brace. Brace off in bed. Required Braces or Orthoses: Spinal Brace Spinal Brace: Lumbar corset, Applied in sitting position Restrictions Weight Bearing Restrictions: No General:       Therapy/Group: Individual Therapy  [name] 11/08/2022, 12:48 PM

## 2022-11-08 NOTE — Progress Notes (Signed)
PROGRESS NOTE   Subjective/Complaints:  Pt reports feeling OK- "back to normal" almost LBM yesterday Pain still 5/10 when meds wear off, but down to 2-3/10 with meds.  Passing gas- thinks needs to have another BM ROS:   Pt denies SOB, abd pain, CP, N/V/C/D, and vision changes    Objective:   No results found. No results for input(s): "WBC", "HGB", "HCT", "PLT" in the last 72 hours.  No results for input(s): "NA", "K", "CL", "CO2", "GLUCOSE", "BUN", "CREATININE", "CALCIUM" in the last 72 hours.   Intake/Output Summary (Last 24 hours) at 11/08/2022 1542 Last data filed at 11/08/2022 1328 Gross per 24 hour  Intake 537 ml  Output 1875 ml  Net -1338 ml     Pressure Injury 11/04/22 Back Left;Medial Stage 2 -  Partial thickness loss of dermis presenting as a shallow open injury with a red, pink wound bed without slough. blister/ device related (Active)  11/04/22 1440  Location: Back  Location Orientation: Left;Medial  Staging: Stage 2 -  Partial thickness loss of dermis presenting as a shallow open injury with a red, pink wound bed without slough.  Wound Description (Comments): blister/ device related  Present on Admission: Yes     Pressure Injury 11/04/22 Lumbar Lateral;Right Stage 2 -  Partial thickness loss of dermis presenting as a shallow open injury with a red, pink wound bed without slough. blister/ device related (Active)  11/04/22 1340  Location: Lumbar  Location Orientation: Lateral;Right  Staging: Stage 2 -  Partial thickness loss of dermis presenting as a shallow open injury with a red, pink wound bed without slough.  Wound Description (Comments): blister/ device related  Present on Admission: Yes    Physical Exam: Vital Signs Blood pressure 129/68, pulse 70, temperature 97.8 F (36.6 C), resp. rate 19, height 5\' 10"  (1.778 m), weight 80.3 kg, SpO2 100 %.       General: awake, alert, appropriate,  sitting up eating breakfast; NAD HENT: whitish lenses; oropharynx moist CV: regular rate; no JVD Pulmonary: CTA B/L; no W/R/R- good air movement GI: soft, NT, ND, (+)BS- normoactive Psychiatric: appropriate Neurological: Ox3  Skin; not examined today Prior-popped blister midback- still oozing; L flank still intact 2x1 cm- staples intact on back honeycomb came off Musculoskeletal:     Cervical back: Neck supple. No tenderness.     Right lower leg: No edema.     Left lower leg: No edema.     Comments: Ue's 5/5 B/L RLE- 5-/5 except HF on R 4+/5 LLE- 5-5/5 except L HF 4+/5  Skin:    General: Skin is warm and dry.     Comments: PIV in dorsum of each hand without erythema Lumbar incision and L groin/abd incision with honeycomb dressing in place- look OK  Neurological:     Mental Status: He is alert and oriented to person, place, and time.     Comments: Intact to light touch in Ue's and torso L1 numbness and tingling L>R; toherwise intact to light touch in all 4 extremities   Assessment/Plan: 1. Functional deficits which require 3+ hours per day of interdisciplinary therapy in a comprehensive inpatient rehab setting. Physiatrist is providing close team  supervision and 24 hour management of active medical problems listed below. Physiatrist and rehab team continue to assess barriers to discharge/monitor patient progress toward functional and medical goals  Care Tool:  Bathing    Body parts bathed by patient: Right arm, Left arm, Chest, Abdomen, Front perineal area, Buttocks, Right upper leg, Left upper leg, Face, Right lower leg, Left lower leg   Body parts bathed by helper: Right lower leg, Left lower leg     Bathing assist Assist Level: Minimal Assistance - Patient > 75%     Upper Body Dressing/Undressing Upper body dressing   What is the patient wearing?: Pull over shirt, Orthosis Orthosis activity level: Performed by helper  Upper body assist Assist Level: Minimal Assistance  - Patient > 75%    Lower Body Dressing/Undressing Lower body dressing      What is the patient wearing?: Pants, Underwear/pull up     Lower body assist Assist for lower body dressing: Moderate Assistance - Patient 50 - 74%     Toileting Toileting    Toileting assist Assist for toileting: Minimal Assistance - Patient > 75%     Transfers Chair/bed transfer  Transfers assist     Chair/bed transfer assist level: Contact Guard/Touching assist     Locomotion Ambulation   Ambulation assist      Assist level: Minimal Assistance - Patient > 75% Assistive device: Walker-rolling Max distance: 67   Walk 10 feet activity   Assist     Assist level: Minimal Assistance - Patient > 75% Assistive device: Walker-rolling   Walk 50 feet activity   Assist Walk 50 feet with 2 turns activity did not occur: Safety/medical concerns (2/2 endurance)  Assist level: Minimal Assistance - Patient > 75% Assistive device: Walker-rolling    Walk 150 feet activity   Assist Walk 150 feet activity did not occur: Safety/medical concerns (2/2 endurance)         Walk 10 feet on uneven surface  activity   Assist Walk 10 feet on uneven surfaces activity did not occur: Safety/medical concerns (2/2 endurance)         Wheelchair     Assist Is the patient using a wheelchair?: No   Wheelchair activity did not occur: N/A         Wheelchair 50 feet with 2 turns activity    Assist    Wheelchair 50 feet with 2 turns activity did not occur: N/A       Wheelchair 150 feet activity     Assist  Wheelchair 150 feet activity did not occur: N/A       Blood pressure 129/68, pulse 70, temperature 97.8 F (36.6 C), resp. rate 19, height 5\' 10"  (1.778 m), weight 80.3 kg, SpO2 100 %.  Medical Problem List and Plan: 1. Functional deficits secondary to lumbar myelopathy             -patient may  shower if cover incisions             -ELOS/Goals: 7-10 mod I   Con't  CIR PT and OT  Due to get staples out next Saturday 2.  Antithrombotics: -DVT/anticoagulation:  Mechanical:  Antiembolism stockings, knee (TED hose) Bilateral lower extremities  4/27- is 7 days, but waiting to hear back from [facility]             -antiplatelet therapy: none             -follow up BLE venous duplex   3. Pain Management: Tylenol,  baclofen, Robaxin, oxycodone as needed   4/24- pain well controlled this AM- if gets in right position- con't meds  4/27- back pain max 5/10 without meds and controlled when receives them 4. Mood/Behavior/Sleep: LCSW to evaluate and provide emotional support             -antipsychotic agents: n/a   5. Neuropsych/cognition: This patient is capable of making decisions on his own behalf.   6. Skin/Wound Care: Routine skin care checks  4/27- Has 2 stage II's from blisters near both incisions- foam and monitoring closely-  7. Fluids/Electrolytes/Nutrition: Routine Is and Os and follow-up chemistries   8: Hypertension: monitor TID and prn             -continue amlodipine 5 mg daily             -continue benazepril 10 mg daily             -continue metroprolol succinate 25 mg daily   4/25- BP controlled- con't regimen 9: Hyperlipidemia: continue statin   10: Legally blind- from glaucoma   11: DM-2: CBGs four times daily; carb modified diet; A!c 6.2% on 4/09             -continue metformin 500 mg with supper             -continue SSI   4/25- BG's controlled- con't regimen  4/27- so controlled- will drop back to BID-AC 12: Glaucoma: continue gtts X 4  4/24- got contacted to restart drops- did    13: History of gout: no reports of flare   14: Urinary hesitancy/frequency- will check PVRs/bladder scans   4/25- Will use condom cath since he cannot use urinal due to blindness 15: Constipation: continue regimen; PRNs ordered  4/24- LBM 2 days ago- if no BM by tomorrow, will intervene  4/25- LBM yesterday- denies more constipaiton  4/26- LBM 2 days ago-  has been refusing bowel meds- passed gas and thought had incontinent BM yesterday- only gas- asked him to take bowel meds today but will wait to give Miralax til noon. 4/27- good BM yesterday and needs to go this AM   16. Blisters/stage II back/L flank  4/26- pt's back blister popped this AM; other one intact- will con't foam dressing and dry dressing on back- honeycomb came off- sutures intact- looks good  4/27- to get Staples out 5/4    LOS: 4 days A FACE TO FACE EVALUATION WAS PERFORMED  Jarelyn Bambach 11/08/2022, 3:42 PM

## 2022-11-08 NOTE — Progress Notes (Signed)
Occupational Therapy Session Note  Patient Details  Name: Brian Daniels MRN: [id number] Date of Birth: [birth year]  Today's Date: 11/08/2022 OT Individual Time: 4098-1191 OT Individual Time Calculation (min): 75 min    Short Term Goals: Week 1:  OT Short Term Goal 1 (Week 1): STG = LTGS (d/t ELOS)  Skilled Therapeutic Interventions/Progress Updates:    Pt received in bed ready for therapy.  Focus of therapy session on safe mobility with back precautions during ADLs.      ADL Retraining: Pt completed toileting, shower and dressing. See ADL documentation below.   Pt used long sponge to wash feet in shower. Due condom cath bag, assisted pt with starting pull ups and pants over feet. Assisted pt with socks due to time constraints.  CGA with all standing.     Transfers: CGA with ambulating to and from bathroom with RW, sitting down and standing up from [facility] over toilet and tub bench.  Cued to push up with B hands to stand and then reach for RW.  Cues for foot placement to allow for a smoother sit to stand.  Good body mechanics to adhere to precautions.  Balance: At sink pt stood to do oral care, with no UE support on RW he needs closer to min A 20% of the time and CGA 80% of the time to avoid ant/post sway.  Pt resting in recliner with all needs met. Alarm set and call light and phone in reach.    Therapy Documentation Precautions:  Precautions Precautions: Fall, Back Precaution Booklet Issued: No Precaution Comments: legally blind; able to ambulate to bathroom without brace. Brace off in bed. Required Braces or Orthoses: Spinal Brace Spinal Brace: Lumbar corset, Applied in sitting position Restrictions Weight Bearing Restrictions: No    Vital Signs: Therapy Vitals Temp: 98 F (36.7 C) Temp Source: Oral Pulse Rate: 83 Resp: 17 BP: 124/65 Patient Position (if appropriate): Lying Oxygen Therapy SpO2: 100 % Pain: Pain Assessment Pain Score: 5  - low back,  premedicated. ADL: ADL Eating: Set up Where Assessed-Eating: Wheelchair Grooming: Supervision/safety, Setup Where Assessed-Grooming: Standing at sink Upper Body Bathing: Setup Where Assessed-Upper Body Bathing: Shower Lower Body Bathing: Minimal assistance Where Assessed-Lower Body Bathing: Shower Upper Body Dressing: Setup Where Assessed-Upper Body Dressing: Chair Lower Body Dressing: Moderate assistance Where Assessed-Lower Body Dressing: Chair Toileting: Minimal assistance Where Assessed-Toileting: Toilet, Psychiatrist Transfer: Furniture conservator/restorer Method: Proofreader: Bedside commode, Grab bars Tub/Shower Transfer: Unable to assess Tub/Shower Transfer Method: Unable to assess Film/video editor: Administrator, arts Method: Designer, industrial/product: Emergency planning/management officer, Grab bars   Therapy/Group: Individual Therapy  Sheral Pfahler 11/08/2022, 10:00 AM

## 2022-11-09 LAB — GLUCOSE, CAPILLARY: Glucose-Capillary: 97 mg/dL (ref 70–99)

## 2022-11-09 MED ORDER — SENNA 8.6 MG PO TABS: 1.0000 | ORAL_TABLET | Freq: Every day | ORAL | Status: DC | PRN

## 2022-11-09 MED ORDER — ENOXAPARIN SODIUM 40 MG/0.4ML IJ SOSY
40.0000 mg | PREFILLED_SYRINGE | INTRAMUSCULAR | Status: DC
  Administered 2022-11-09 – 2022-11-13 (×5): 40 mg via SUBCUTANEOUS
  Filled 2022-11-09 (×5): qty 0.4

## 2022-11-09 NOTE — Progress Notes (Signed)
Slept good. New condom cath placed. PRN tylenol & 1 oxy ir given at 2115, per patient's request, complaining of back pain. Honeycomb dressing, along with 3 foam dressings in place to left back/flank area. SCD's applied. Refused scheduled senna & colace, reports 2 "good" BM's 04/27. Alfredo Martinez A

## 2022-11-09 NOTE — Progress Notes (Signed)
PROGRESS NOTE   Subjective/Complaints:  Pt reports LBM this AM- medium BM Per nursing, urine a little cloudy  Ate 100% tray- feeling good- refused bowel meds since going regularly  ROS:   Pt denies SOB, abd pain, CP, N/V/C/D, and vision changes Except for HPI   Objective:   No results found. No results for input(s): "WBC", "HGB", "HCT", "PLT" in the last 72 hours.  No results for input(s): "NA", "K", "CL", "CO2", "GLUCOSE", "BUN", "CREATININE", "CALCIUM" in the last 72 hours.   Intake/Output Summary (Last 24 hours) at 11/09/2022 1020 Last data filed at 11/09/2022 4259 Gross per 24 hour  Intake 480 ml  Output 2225 ml  Net -1745 ml     Pressure Injury 11/04/22 Back Left;Medial Stage 2 -  Partial thickness loss of dermis presenting as a shallow open injury with a red, pink wound bed without slough. blister/ device related (Active)  11/04/22 1440  Location: Back  Location Orientation: Left;Medial  Staging: Stage 2 -  Partial thickness loss of dermis presenting as a shallow open injury with a red, pink wound bed without slough.  Wound Description (Comments): blister/ device related  Present on Admission: Yes     Pressure Injury 11/04/22 Lumbar Lateral;Right Stage 2 -  Partial thickness loss of dermis presenting as a shallow open injury with a red, pink wound bed without slough. blister/ device related (Active)  11/04/22 1340  Location: Lumbar  Location Orientation: Lateral;Right  Staging: Stage 2 -  Partial thickness loss of dermis presenting as a shallow open injury with a red, pink wound bed without slough.  Wound Description (Comments): blister/ device related  Present on Admission: Yes    Physical Exam: Vital Signs Blood pressure (!) 141/73, pulse 70, temperature 98 F (36.7 C), temperature source Oral, resp. rate 19, height 5\' 10"  (1.778 m), weight 80.3 kg, SpO2 100 %.        General: awake, alert,  appropriate, sitting up in bed; ate 100% tray; NAD HENT: white lenses due to glaucoma;  oropharynx moist CV: regular rate and rhythm; no JVD Pulmonary: CTA B/L; no W/R/R- good air movement GI: soft, NT, ND, (+)BS- normoactive Psychiatric: appropriate Neurological: Ox3   Skin; not examined today Prior-popped blister midback- still oozing; L flank still intact 2x1 cm- staples intact on back honeycomb came off Musculoskeletal:     Cervical back: Neck supple. No tenderness.     Right lower leg: No edema.     Left lower leg: No edema.     Comments: Ue's 5/5 B/L RLE- 5-/5 except HF on R 4+/5 LLE- 5-5/5 except L HF 4+/5  Skin:    General: Skin is warm and dry.     Comments: PIV in dorsum of each hand without erythema Lumbar incision and L groin/abd incision with honeycomb dressing in place- look OK  Neurological:     Mental Status: He is alert and oriented to person, place, and time.     Comments: Intact to light touch in Ue's and torso L1 numbness and tingling L>R; toherwise intact to light touch in all 4 extremities   Assessment/Plan: 1. Functional deficits which require 3+ hours per day of interdisciplinary therapy  in a comprehensive inpatient rehab setting. Physiatrist is providing close team supervision and 24 hour management of active medical problems listed below. Physiatrist and rehab team continue to assess barriers to discharge/monitor patient progress toward functional and medical goals  Care Tool:  Bathing    Body parts bathed by patient: Right arm, Left arm, Chest, Abdomen, Front perineal area, Buttocks, Right upper leg, Left upper leg, Face, Right lower leg, Left lower leg   Body parts bathed by helper: Right lower leg, Left lower leg     Bathing assist Assist Level: Minimal Assistance - Patient > 75%     Upper Body Dressing/Undressing Upper body dressing   What is the patient wearing?: Pull over shirt, Orthosis Orthosis activity level: Performed by helper  Upper  body assist Assist Level: Minimal Assistance - Patient > 75%    Lower Body Dressing/Undressing Lower body dressing      What is the patient wearing?: Pants, Underwear/pull up     Lower body assist Assist for lower body dressing: Moderate Assistance - Patient 50 - 74%     Toileting Toileting    Toileting assist Assist for toileting: Minimal Assistance - Patient > 75%     Transfers Chair/bed transfer  Transfers assist     Chair/bed transfer assist level: Contact Guard/Touching assist     Locomotion Ambulation   Ambulation assist      Assist level: Minimal Assistance - Patient > 75% Assistive device: Walker-rolling Max distance: 67   Walk 10 feet activity   Assist     Assist level: Minimal Assistance - Patient > 75% Assistive device: Walker-rolling   Walk 50 feet activity   Assist Walk 50 feet with 2 turns activity did not occur: Safety/medical concerns (2/2 endurance)  Assist level: Minimal Assistance - Patient > 75% Assistive device: Walker-rolling    Walk 150 feet activity   Assist Walk 150 feet activity did not occur: Safety/medical concerns (2/2 endurance)         Walk 10 feet on uneven surface  activity   Assist Walk 10 feet on uneven surfaces activity did not occur: Safety/medical concerns (2/2 endurance)         Wheelchair     Assist Is the patient using a wheelchair?: No   Wheelchair activity did not occur: N/A         Wheelchair 50 feet with 2 turns activity    Assist    Wheelchair 50 feet with 2 turns activity did not occur: N/A       Wheelchair 150 feet activity     Assist  Wheelchair 150 feet activity did not occur: N/A       Blood pressure (!) 141/73, pulse 70, temperature 98 F (36.7 C), temperature source Oral, resp. rate 19, height 5\' 10"  (1.778 m), weight 80.3 kg, SpO2 100 %.  Medical Problem List and Plan: 1. Functional deficits secondary to lumbar myelopathy             -patient may   shower if cover incisions             -ELOS/Goals: 7-10 mod I   Due to get Staples out next Saturday 5/4  Con't CIR PT and OT 2.  Antithrombotics: -DVT/anticoagulation:  Mechanical:  Antiembolism stockings, knee (TED hose) Bilateral lower extremities  4/27- is 7 days, but waiting to hear back from [facility]  4/28- got approval from [facility]- started lovenox for now             -  antiplatelet therapy: none             -follow up BLE venous duplex   3. Pain Management: Tylenol, baclofen, Robaxin, oxycodone as needed   4/24- pain well controlled this AM- if gets in right position- con't meds  4/27- back pain max 5/10 without meds and controlled when receives them 4. Mood/Behavior/Sleep: LCSW to evaluate and provide emotional support             -antipsychotic agents: n/a   5. Neuropsych/cognition: This patient is capable of making decisions on his own behalf.   6. Skin/Wound Care: Routine skin care checks  4/27- Has 2 stage II's from blisters near both incisions- foam and monitoring closely-  7. 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   11: DM-2: CBGs four times daily; carb modified diet; A!c 6.2% on 4/09             -continue metformin 500 mg with supper             -continue SSI   4/25- BG's controlled- con't regimen  4/27- so controlled- will drop back to BID-AC  4/28- Will stop CBG's since doing so well 12: Glaucoma: continue gtts X 4  4/24- got contacted to restart drops- did    13: History of gout: no reports of flare   14: Urinary hesitancy/frequency- will check PVRs/bladder scans   4/25- Will use condom cath since he cannot use urinal due to blindness 15:  Constipation: continue regimen; PRNs ordered  4/24- LBM 2 days ago- if no BM by tomorrow, will intervene  4/25- LBM yesterday- denies more constipaiton  4/26- LBM 2 days ago- has been refusing bowel meds- passed gas and thought had incontinent BM yesterday- only gas- asked him to take bowel meds today but will wait to give Miralax til noon. 4/27- good BM yesterday and needs to go this AM  4/28- LBM yesterday-will stop Colace- make Senna 1 tab daily prn and ocn't Miralax  16. Blisters/stage II back/L flank  4/26- pt's back blister popped this AM; other one intact- will con't foam dressing and dry dressing on back- honeycomb came off- sutures intact- looks good  4/27- to get Staples out 5/4    LOS: 5 days A FACE TO FACE EVALUATION WAS PERFORMED  Brian Daniels 11/09/2022, 10:20 AM

## 2022-11-10 LAB — BASIC METABOLIC PANEL
Anion gap: 8 (ref 5–15)
BUN: 15 mg/dL (ref 8–23)
CO2: 26 mmol/L (ref 22–32)
Calcium: 8.4 mg/dL — ABNORMAL LOW (ref 8.9–10.3)
Chloride: 104 mmol/L (ref 98–111)
Creatinine, Ser: 0.94 mg/dL (ref 0.61–1.24)
GFR, Estimated: >60 mL/min (ref >60)
Glucose, Bld: 96 mg/dL (ref 70–99)
Potassium: 3.4 mmol/L — ABNORMAL LOW (ref 3.5–5.1)
Sodium: 138 mmol/L (ref 135–145)

## 2022-11-10 LAB — CBC
HCT: 32.2 % — ABNORMAL LOW (ref 39.0–52.0)
Hemoglobin: 10.8 g/dL — ABNORMAL LOW (ref 13.0–17.0)
MCH: 27.1 pg (ref 26.0–34.0)
MCHC: 33.5 g/dL (ref 30.0–36.0)
MCV: 80.9 fL (ref 80.0–100.0)
Platelets: 288 10*3/uL (ref 150–400)
RBC: 3.98 MIL/uL — ABNORMAL LOW (ref 4.22–5.81)
RDW: 13.5 % (ref 11.5–15.5)
WBC: 6.7 10*3/uL (ref 4.0–10.5)
nRBC: 0 % (ref 0.0–0.2)

## 2022-11-10 MED ORDER — POTASSIUM CHLORIDE CRYS ER 20 MEQ PO TBCR
40.0000 meq | EXTENDED_RELEASE_TABLET | Freq: Once | ORAL | Status: AC
  Administered 2022-11-10: 40 meq via ORAL
  Filled 2022-11-10: qty 2

## 2022-11-10 NOTE — Progress Notes (Signed)
Occupational Therapy Session Note  Patient Details  Name: Brian Daniels MRN: [id number] Date of Birth: 1955/08/29  {CHL IP REHAB OT TIME CALCULATIONS:[id number]}   Short Term Goals: Week 1:  OT Short Term Goal 1 (Week 1): STG = LTGS (d/t ELOS)  Skilled Therapeutic Interventions/Progress Updates:  Pt received *** for skilled OT session with focus on ***. Pt agreeable to interventions, demonstrating overall *** mood. Pt reported ***/10 pain, stating "***" in reference to ***. OT offering intermediate rest breaks and positioning suggestions throughout session to address pain/fatigue and maximize participation/safety in session.    Pt remained *** with all immediate needs met at end of session. Pt continues to be appropriate for skilled OT intervention to promote further functional independence.   Therapy Documentation Precautions:  Precautions Precautions: Fall, Back Precaution Booklet Issued: No Precaution Comments: legally blind; able to ambulate to bathroom without brace. Brace off in bed. Required Braces or Orthoses: Spinal Brace Spinal Brace: Lumbar corset, Applied in sitting position Restrictions Weight Bearing Restrictions: No   Therapy/Group: Individual Therapy  Lou Cal, OTR/L, MSOT  11/10/2022, 10:46 PM

## 2022-11-10 NOTE — Progress Notes (Signed)
Occupational Therapy Session Note  Patient Details  Name: Brian Daniels MRN: [id number] Date of Birth: 08-11-1955  Today's Date: 11/10/2022 OT Individual Time: 1300-1413 OT Individual Time Calculation (min): 73 min    Short Term Goals: Week 1:  OT Short Term Goal 1 (Week 1): STG = LTGS (d/t ELOS)  Skilled Therapeutic Interventions/Progress Updates:  Pt received sitting in recliner for skilled OT session with focus on general strengthening and BADL participation. Pt agreeable to interventions, demonstrating overall pleasant mood. Pt reported 5-6/10 pain at surgical site, sharing L hip pain is "not too bad."  OT offering intermediate rest breaks and positioning suggestions throughout session to address pain/fatigue and maximize participation/safety in session.   Pt ambulates from room <> day room with CGA + RW. In day room, pt participates in ~10 mins of Nustep for general strengthening/conditioning.   Back in patient room, pt ambulates into bathroom with increased difficulty walking over threshold, requires + time but remains at CGA + RW. Pt requires Min A for clothing management, no success with void. Pt doffs pants with re-education of reacher use and Min A, supervision for shirt, dependent for back brace and socks for time management. Pt bathes LB with use of long-handled sponge, and setup A for remainder of body. In standing with CGA + grab bar, pt bathes peri-area, requiring Min A for thoroughness. Pt requires Max A for donning LB garments due to time constraints.   Pt remained sitting in recliner with all immediate needs met at end of session. Pt continues to be appropriate for skilled OT intervention to promote further functional independence.   Therapy Documentation Precautions:  Precautions Precautions: Fall, Back Precaution Booklet Issued: No Precaution Comments: legally blind; able to ambulate to bathroom without brace. Brace off in bed. Required Braces or Orthoses: Spinal  Brace Spinal Brace: Lumbar corset, Applied in sitting position Restrictions Weight Bearing Restrictions: No   Therapy/Group: Individual Therapy  Lou Cal, OTR/L, MSOT  11/10/2022, 6:20 AM

## 2022-11-10 NOTE — Progress Notes (Signed)
Physical Therapy Session Note  Patient Details  Name: Brian Daniels MRN: [id number] Date of Birth: [birth year]  Today's Date: 11/10/2022 PT Individual Time: 0800-0915 PT Individual Time Calculation (min): 75 min   Short Term Goals: Week 1:  PT Short Term Goal 1 (Week 1): STG=LTG 2/2 LOS  Skilled Therapeutic Interventions/Progress Updates:    Session 1: pt received in bed and agreeable to therapy. Pt reports  L sided flank/hip pain that started earlier in the morning after being pulled up in bed. Rest and positioning as needed. Supine>sit with supervision and cueing from flat bed with use of bed rail. Pt donned brace with verbal cueing. Sit to stand with CGA to RW throughout session. Pt requested to brush teeth, ambulatory transfer to sink, and pt stood with CGA-supervision to brush teeth with set up A. Pt then ambulated to bathroom with CGA and cueing for navigation. Continent bowel void, documented in flow sheets. Tot A for hygiene. Pt then assisted with changing scrub pants as previous pair had ripped. Pt then stood at sink to wash hands. Pt reports he is standing longer than he was able to right after surgery, therapist notes longer stands than previous sessions. Pt ambulated 2 x ~80 ft with seated rest break. Frequent cueing for upright posture and hip extension. Pt then participated in seated marching alternated with standing hip flexor strength, pt demoed improved posture when ambulating back to room after stretching. Pt then remained in recliner and was left with all needs in reach and alarm active.   Session 2: Pt received in recliner and agreeable to therapy.  Pt reports intermittent L sided pain with some movements, provided with ice pack at end of session. Donned brace with cueing. ambulatory transfer to w/c with RW and CGA. Pt transported to [facility] entrance for time management and energy conservation. Pt ambulated 2 x ~120 ft with RW and CGA, noted more hesitant/slow gait on uneven outdoor  surface, but improved with practice. Pt stood from outdoor benches and picnic tables for practice in varying community spaces. Pt then attempted set of Sit to stand but was limited by L hip/back pain when standing from picnic table seat. Pt then returned to room. Pt requested to use bathroom, ambulatory transfer to bathroom with CGA and RW. Pt set up on commode and NT informed of position, pt's sister remained in room.   Therapy Documentation Precautions:  Precautions Precautions: Fall, Back Precaution Booklet Issued: No Precaution Comments: legally blind; able to ambulate to bathroom without brace. Brace off in bed. Required Braces or Orthoses: Spinal Brace Spinal Brace: Lumbar corset, Applied in sitting position Restrictions Weight Bearing Restrictions: No General:       Therapy/Group: Individual Therapy  [name] 11/10/2022, 12:08 PM

## 2022-11-10 NOTE — Progress Notes (Signed)
New Condom cath applied at 2215. LBM-04/28. SCD's in place. Patient not comfortable lying on side. Bilateral heels elevated off bed with pillows. 3 small foam dressings covering blisters to left flank and honeycomb dressing intact. PRN tylenol and 5mg 's Oxy IR given at 2342, per patient's request. Complaining of back pain. Brian Daniels A

## 2022-11-10 NOTE — Progress Notes (Signed)
PROGRESS NOTE   Subjective/Complaints:  Pt reports just got AM pain meds- pain still 4-5/10 in AM.  Had BM yesterday.  No other concerns- on phone   ROS:  Pt denies SOB, abd pain, CP, N/V/C/D  Except for HPI   Objective:   No results found. Recent Labs    11/10/22 0554  WBC 6.7  HGB 10.8*  HCT 32.2*  PLT 288    Recent Labs    11/10/22 0554  NA 138  K 3.4*  CL 104  CO2 26  GLUCOSE 96  BUN 15  CREATININE 0.94  CALCIUM 8.4*     Intake/Output Summary (Last 24 hours) at 11/10/2022 0914 Last data filed at 11/10/2022 0805 Gross per 24 hour  Intake 715 ml  Output 1650 ml  Net -935 ml     Pressure Injury 11/04/22 Back Left;Medial Stage 2 -  Partial thickness loss of dermis presenting as a shallow open injury with a red, pink wound bed without slough. blister/ device related (Active)  11/04/22 1440  Location: Back  Location Orientation: Left;Medial  Staging: Stage 2 -  Partial thickness loss of dermis presenting as a shallow open injury with a red, pink wound bed without slough.  Wound Description (Comments): blister/ device related  Present on Admission: Yes     Pressure Injury 11/04/22 Lumbar Lateral;Right Stage 2 -  Partial thickness loss of dermis presenting as a shallow open injury with a red, pink wound bed without slough. blister/ device related (Active)  11/04/22 1340  Location: Lumbar  Location Orientation: Lateral;Right  Staging: Stage 2 -  Partial thickness loss of dermis presenting as a shallow open injury with a red, pink wound bed without slough.  Wound Description (Comments): blister/ device related  Present on Admission: Yes    Physical Exam: Vital Signs Blood pressure (!) 146/75, pulse 67, temperature 98.3 F (36.8 C), resp. rate 17, height 5\' 10"  (1.778 m), weight 80.3 kg, SpO2 100 %.         General: awake, alert, appropriate, sitting up in bed; on phone; NAD HENT: lenses  whitish from glaucoma oropharynx moist CV: regular rate; no JVD Pulmonary: CTA B/L; no W/R/R- good air movement GI: soft, NT, ND, (+)BS Psychiatric: appropriate- bright affect Neurological: Ox3   Skin; not examined today Prior-popped blister midback- still oozing; L flank still intact 2x1 cm- staples intact on back honeycomb came off Musculoskeletal:     Cervical back: Neck supple. No tenderness.     Right lower leg: No edema.     Left lower leg: No edema.     Comments: Ue's 5/5 B/L RLE- 5-/5 except HF on R 4+/5 LLE- 5-5/5 except L HF 4+/5  Skin:    General: Skin is warm and dry.     Comments: PIV in dorsum of each hand without erythema Lumbar incision and L groin/abd incision with honeycomb dressing in place- look OK  Neurological:     Mental Status: He is alert and oriented to person, place, and time.     Comments: Intact to light touch in Ue's and torso L1 numbness and tingling L>R; toherwise intact to light touch in all 4 extremities  Assessment/Plan: 1. Functional deficits which require 3+ hours per day of interdisciplinary therapy in a comprehensive inpatient rehab setting. Physiatrist is providing close team supervision and 24 hour management of active medical problems listed below. Physiatrist and rehab team continue to assess barriers to discharge/monitor patient progress toward functional and medical goals  Care Tool:  Bathing    Body parts bathed by patient: Right arm, Left arm, Chest, Abdomen, Front perineal area, Buttocks, Right upper leg, Left upper leg, Face, Right lower leg, Left lower leg   Body parts bathed by helper: Right lower leg, Left lower leg     Bathing assist Assist Level: Minimal Assistance - Patient > 75%     Upper Body Dressing/Undressing Upper body dressing   What is the patient wearing?: Pull over shirt, Orthosis Orthosis activity level: Performed by helper  Upper body assist Assist Level: Minimal Assistance - Patient > 75%    Lower  Body Dressing/Undressing Lower body dressing      What is the patient wearing?: Pants, Underwear/pull up     Lower body assist Assist for lower body dressing: Moderate Assistance - Patient 50 - 74%     Toileting Toileting    Toileting assist Assist for toileting: Minimal Assistance - Patient > 75%     Transfers Chair/bed transfer  Transfers assist     Chair/bed transfer assist level: Contact Guard/Touching assist     Locomotion Ambulation   Ambulation assist      Assist level: Minimal Assistance - Patient > 75% Assistive device: Walker-rolling Max distance: 67   Walk 10 feet activity   Assist     Assist level: Minimal Assistance - Patient > 75% Assistive device: Walker-rolling   Walk 50 feet activity   Assist Walk 50 feet with 2 turns activity did not occur: Safety/medical concerns (2/2 endurance)  Assist level: Minimal Assistance - Patient > 75% Assistive device: Walker-rolling    Walk 150 feet activity   Assist Walk 150 feet activity did not occur: Safety/medical concerns (2/2 endurance)         Walk 10 feet on uneven surface  activity   Assist Walk 10 feet on uneven surfaces activity did not occur: Safety/medical concerns (2/2 endurance)         Wheelchair     Assist Is the patient using a wheelchair?: No   Wheelchair activity did not occur: N/A         Wheelchair 50 feet with 2 turns activity    Assist    Wheelchair 50 feet with 2 turns activity did not occur: N/A       Wheelchair 150 feet activity     Assist  Wheelchair 150 feet activity did not occur: N/A       Blood pressure (!) 146/75, pulse 67, temperature 98.3 F (36.8 C), resp. rate 17, height 5\' 10"  (1.778 m), weight 80.3 kg, SpO2 100 %.  Medical Problem List and Plan: 1. Functional deficits secondary to lumbar myelopathy             -patient may  shower if cover incisions             -ELOS/Goals: 7-10 mod I   Due to get Staples out next  Saturday 5/4  Con't CIR PT and OT 2.  Antithrombotics: -DVT/anticoagulation:  Mechanical:  Antiembolism stockings, knee (TED hose) Bilateral lower extremities  4/27- is 7 days, but waiting to hear back from [facility]  4/28- got approval from [facility]- started lovenox for now             -  antiplatelet therapy: none             -follow up BLE venous duplex   3. Pain Management: Tylenol, baclofen, Robaxin, oxycodone as needed   4/29- pain controlled when takes pain meds- usually 4-5/10 without meds- less when takes meds 4. Mood/Behavior/Sleep: LCSW to evaluate and provide emotional support             -antipsychotic agents: n/a   5. Neuropsych/cognition: This patient is capable of making decisions on his own behalf.   6. Skin/Wound Care: Routine skin care checks  4/27- Has 2 stage II's from blisters near both incisions- foam and monitoring closely-  7. 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   11: DM-2: CBGs four times daily; carb modified diet; A!c 6.2% on 4/09             -continue metformin 500 mg with supper             -continue SSI   4/25- BG's controlled- con't regimen  4/27- so controlled- will drop back to BID-AC  4/28- Will stop CBG's since doing so well 12: Glaucoma: continue gtts X 4  4/24- got contacted to restart drops- did    13: History of gout: no reports of flare   14: Urinary hesitancy/frequency- will check PVRs/bladder scans   4/25- Will use condom cath since he cannot use urinal due to blindness 15: Constipation: continue regimen; PRNs ordered  4/24- LBM 2 days ago- if no BM by tomorrow, will intervene  4/25- LBM yesterday- denies more  constipaiton  4/26- LBM 2 days ago- has been refusing bowel meds- passed gas and thought had incontinent BM yesterday- only gas- asked him to take bowel meds today but will wait to give Miralax til noon. 4/27- good BM yesterday and needs to go this AM  4/28- LBM yesterday-will stop Colace- make Senna 1 tab daily prn and ocn't Miralax  4/29- having regular BM's- pt doesn't want to take bowel meds- explained will need some due to pain meds  16. Blisters/stage II back/L flank  4/26- pt's back blister popped this AM; other one intact- will con't foam dressing and dry dressing on back- honeycomb came off- sutures intact- looks good  4/27- to get Staples out 5/4  17. Hypokalemia  4/29- K+ 3.4- will replete 40 mEq x1  LOS: 6 days A FACE TO FACE EVALUATION WAS PERFORMED  Sinan Tuch 11/10/2022, 9:14 AM

## 2022-11-11 ENCOUNTER — Encounter (HOSPITAL_COMMUNITY): Payer: Self-pay | Admitting: Neurosurgery

## 2022-11-11 LAB — URINALYSIS, ROUTINE W REFLEX MICROSCOPIC
Bilirubin Urine: NEGATIVE
Glucose, UA: NEGATIVE mg/dL
Hgb urine dipstick: NEGATIVE
Ketones, ur: NEGATIVE mg/dL
Nitrite: NEGATIVE
Protein, ur: NEGATIVE mg/dL
Specific Gravity, Urine: 1.013 (ref 1.005–1.030)
WBC, UA: >50 WBC/hpf (ref 0–5)
pH: 6 (ref 5.0–8.0)

## 2022-11-11 MED ORDER — CEPHALEXIN 250 MG PO CAPS
500.0000 mg | ORAL_CAPSULE | Freq: Three times a day (TID) | ORAL | Status: DC
  Administered 2022-11-11 – 2022-11-14 (×8): 500 mg via ORAL
  Filled 2022-11-11 (×8): qty 2

## 2022-11-11 NOTE — Progress Notes (Signed)
Patient ID: Brian Daniels, male   DOB: 06-26-1956, 67 y.o.   MRN: [id number]  SW met with pt in room to provide updates from team conference, d/c date 5/3, and outpatient PT recommended. Pt aware SW will follow-up with pt sister.   1610-SW spoke with pt sister [name] to discuss above. When discussing outpatient therapy locations near their home, preferred [facility] on [address]. SW will confirm if able to accept referral. If  not, she is amenable to [facility]. Reports she has concerns about a blister that popped and a possible UTI. She is aware concerns will be relayed to medical team. Fam edu Thursday (5/2) 1pm-4pm.  SW relayed medical concerns.  Cecile Sheerer, MSW, LCSWA Office: [phone] Cell: [phone] Fax: [phone]

## 2022-11-11 NOTE — Progress Notes (Addendum)
PROGRESS NOTE   Subjective/Complaints:  Pt reports about to get pain meds.   LBM yesterday - #4.  Going home with sister, so we spoke at length about using condom catheter not going to work when goes home- because going home with sister.  ,Says having more incontinence between voids- so that's why wants condom cath- new for him.   We decided to check U/A and Cx to make sure not caused by UTI.   Spoke with OT to practice working with urinal, log rolling- so can void on his own.    ROS:  Pt denies SOB, abd pain, CP, N/V/C/D Except for HPI   Objective:   No results found. Recent Labs    11/10/22 0554  WBC 6.7  HGB 10.8*  HCT 32.2*  PLT 288    Recent Labs    11/10/22 0554  NA 138  K 3.4*  CL 104  CO2 26  GLUCOSE 96  BUN 15  CREATININE 0.94  CALCIUM 8.4*     Intake/Output Summary (Last 24 hours) at 11/11/2022 0843 Last data filed at 11/11/2022 0700 Gross per 24 hour  Intake 440 ml  Output 1775 ml  Net -1335 ml     Pressure Injury 11/04/22 Back Left;Medial Stage 2 -  Partial thickness loss of dermis presenting as a shallow open injury with a red, pink wound bed without slough. blister/ device related (Active)  11/04/22 1440  Location: Back  Location Orientation: Left;Medial  Staging: Stage 2 -  Partial thickness loss of dermis presenting as a shallow open injury with a red, pink wound bed without slough.  Wound Description (Comments): blister/ device related  Present on Admission: Yes     Pressure Injury 11/04/22 Lumbar Lateral;Right Stage 2 -  Partial thickness loss of dermis presenting as a shallow open injury with a red, pink wound bed without slough. blister/ device related (Active)  11/04/22 1340  Location: Lumbar  Location Orientation: Lateral;Right  Staging: Stage 2 -  Partial thickness loss of dermis presenting as a shallow open injury with a red, pink wound bed without slough.  Wound  Description (Comments): blister/ device related  Present on Admission: Yes    Physical Exam: Vital Signs Blood pressure (!) 153/78, pulse 65, temperature 98.1 F (36.7 C), temperature source Oral, resp. rate 17, height 5\' 10"  (1.778 m), weight 80.3 kg, SpO2 98 %.         General: awake, alert, appropriate, blind- sitting EOB working on getting on socks; OT in room; NAD HENT: blind- whitish lenses B/L;  oropharynx moist CV: regular rate; no JVD Pulmonary: CTA B/L; no W/R/R- good air movement GI: soft, NT, ND, (+)BS- normoactive Psychiatric: appropriate Neurological: Ox3 Skin; -popped blister midback- and right next to back incision- honeycomb on back incision-- when peel back foam dressing, skin peels some with it- so didn't remove-  GU_ condom cath in place- light amber urine in bag Musculoskeletal:     Cervical back: Neck supple. No tenderness.     Right lower leg: No edema.     Left lower leg: No edema.     Comments: Ue's 5/5 B/L RLE- 5-/5 except HF on R 4+/5 LLE-  5-5/5 except L HF 4+/5  Skin:    General: Skin is warm and dry.     Comments: PIV in dorsum of each hand without erythema Lumbar incision and L groin/abd incision with honeycomb dressing in place- look OK  Neurological:     Mental Status: He is alert and oriented to person, place, and time.     Comments: Intact to light touch in Ue's and torso L1 numbness and tingling L>R; toherwise intact to light touch in all 4 extremities   Assessment/Plan: 1. Functional deficits which require 3+ hours per day of interdisciplinary therapy in a comprehensive inpatient rehab setting. Physiatrist is providing close team supervision and 24 hour management of active medical problems listed below. Physiatrist and rehab team continue to assess barriers to discharge/monitor patient progress toward functional and medical goals  Care Tool:  Bathing    Body parts bathed by patient: Right arm, Left arm, Chest, Abdomen, Front  perineal area, Buttocks, Right upper leg, Left upper leg, Face, Right lower leg, Left lower leg   Body parts bathed by helper: Right lower leg, Left lower leg     Bathing assist Assist Level: Contact Guard/Touching assist     Upper Body Dressing/Undressing Upper body dressing   What is the patient wearing?: Pull over shirt, Orthosis Orthosis activity level: Performed by helper  Upper body assist Assist Level: Set up assist    Lower Body Dressing/Undressing Lower body dressing      What is the patient wearing?: Pants, Underwear/pull up     Lower body assist Assist for lower body dressing: Moderate Assistance - Patient 50 - 74%     Toileting Toileting    Toileting assist Assist for toileting: Minimal Assistance - Patient > 75%     Transfers Chair/bed transfer  Transfers assist     Chair/bed transfer assist level: Contact Guard/Touching assist     Locomotion Ambulation   Ambulation assist      Assist level: Minimal Assistance - Patient > 75% Assistive device: Walker-rolling Max distance: 67   Walk 10 feet activity   Assist     Assist level: Minimal Assistance - Patient > 75% Assistive device: Walker-rolling   Walk 50 feet activity   Assist Walk 50 feet with 2 turns activity did not occur: Safety/medical concerns (2/2 endurance)  Assist level: Minimal Assistance - Patient > 75% Assistive device: Walker-rolling    Walk 150 feet activity   Assist Walk 150 feet activity did not occur: Safety/medical concerns (2/2 endurance)         Walk 10 feet on uneven surface  activity   Assist Walk 10 feet on uneven surfaces activity did not occur: Safety/medical concerns (2/2 endurance)         Wheelchair     Assist Is the patient using a wheelchair?: No   Wheelchair activity did not occur: N/A         Wheelchair 50 feet with 2 turns activity    Assist    Wheelchair 50 feet with 2 turns activity did not occur: N/A        Wheelchair 150 feet activity     Assist  Wheelchair 150 feet activity did not occur: N/A       Blood pressure (!) 153/78, pulse 65, temperature 98.1 F (36.7 C), temperature source Oral, resp. rate 17, height 5\' 10"  (1.778 m), weight 80.3 kg, SpO2 98 %.  Medical Problem List and Plan: 1. Functional deficits secondary to lumbar myelopathy             -  patient may  shower if cover incisions             -ELOS/Goals: 7-10 mod I   Due to get Staples out next Saturday 5/4  Con't CIR PT and OT-will work on techniques to stop using condom cath- will remove tomorrow 2.  Antithrombotics: -DVT/anticoagulation:  Mechanical:  Antiembolism stockings, knee (TED hose) Bilateral lower extremities  4/27- is 7 days, but waiting to hear back from [facility]  4/28- got approval from [facility]- started lovenox for now             -antiplatelet therapy: none             -follow up BLE venous duplex   3. Pain Management: Tylenol, baclofen, Robaxin, oxycodone as needed   4/29- 4/30pain controlled when takes pain meds- usually 4-5/10 without meds- less when takes meds 4. Mood/Behavior/Sleep: LCSW to evaluate and provide emotional support             -antipsychotic agents: n/a   5. Neuropsych/cognition: This patient is capable of making decisions on his own behalf.   6. Skin/Wound Care: Routine skin care checks  4/27- Has 2 stage II's from blisters near both incisions- foam and monitoring closely-  7. 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   11: DM-2: CBGs four times daily; carb modified diet; A!c 6.2% on 4/09             -continue metformin 500 mg with  supper             -continue SSI   4/25- BG's controlled- con't regimen  4/27- so controlled- will drop back to BID-AC  4/28- Will stop CBG's since doing so well 12: Glaucoma: continue gtts X 4  4/24- got contacted to restart drops- did    13: History of gout: no reports of flare   14: Urinary hesitancy/frequency- will check PVRs/bladder scans   4/25- Will use condom cath since he cannot use urinal due to blindness  4/30- will check U/A and Cx- to make sure no UTI- having a lot of frequency and dribbling in between voiding.  15: Constipation: continue regimen; PRNs ordered  4/24- LBM 2 days ago- if no BM by tomorrow, will intervene  4/25- LBM yesterday- denies more constipaiton  4/26- LBM 2 days ago- has been refusing bowel meds- passed gas and thought had incontinent BM yesterday- only gas- asked him to take bowel meds today but will wait to give Miralax til noon. 4/27- good BM yesterday and needs to go this AM  4/28- LBM yesterday-will stop Colace- make Senna 1 tab daily prn and ocn't Miralax  4/29- having regular BM's- pt doesn't want to take bowel meds- explained will need some due to pain meds 4.30- LBM yesterday-   16. Blisters/stage II back/L flank  4/26- pt's back blister popped this AM; other one intact- will con't foam dressing and dry dressing on back- honeycomb came off- sutures intact- looks good  4/27- to get Staples out 5/4  4/30- con't foam dressing- will d/c honeycomb dressing today  17. Hypokalemia  4/29- K+ 3.4- will replete 40 mEq x1  Addendum- has UTI- started Keflex 500 mg q8 hours-   I spent a total of 50   minutes on total care today- >50% coordination of care- due to  D/w OT about urination- and urinal; d/w pt about frequency and dribbling- will try to stop condom cath tomorrow after know what's going on with U/A and Cx- team conference today to determine length of stay  LOS: 7 days A FACE TO FACE EVALUATION WAS PERFORMED  Nikiah Goin 11/11/2022, 8:43 AM

## 2022-11-11 NOTE — Patient Care Conference (Signed)
Inpatient RehabilitationTeam Conference and Plan of Care Update Date: 11/11/2022   Time: 11:07 AM   Patient Name: Brian Daniels      Medical Record Number: [id number]  Date of Birth: 06-02-1956 Sex: Male         Room/Bed: 4W07C/4W07C-01 Payor Info: Payor: [employer]ICARE / Plan: BCBS MEDICARE / Product Type: *No Product type* /    Admit Date/Time:  11/04/2022  4:57 PM  Primary Diagnosis:  Lumbar myelopathy [facility])  Hospital Problems: Principal Problem:   Lumbar myelopathy [facility])    Expected Discharge Date: Expected Discharge Date: 11/14/22  Team Members Present: Physician leading conference: Dr. Genice Rouge Social Worker Present: Cecile Sheerer, LCSWA Nurse Present: Vedia Pereyra, RN PT Present: Bernie Covey, PT OT Present: Roney Mans, OT PPS Coordinator present : Fae Pippin, SLP     Current Status/Progress Goal Weekly Team Focus  Bowel/Bladder   Pt uses a condom cath d/t urinary frequency/hesitancy (& blindness), but continent of B/B. LBM: 4/29   Pt will not have any incontinent episodes when condom cath is not on.   Assist w/ toileting needs if needed. Monitor condom cath and change per protocol.    Swallow/Nutrition/ Hydration               ADL's   Setup-Min A for UB ADLs, Mod A for LB ADLs & Toileting   Mod I   AE training, tub transfer, dynamic standing balance    Mobility   CGA-supervision for gait and transfers, gait limited by hip flexor tightness   supervision for visual needs  gait, endurance, strength    Communication                Safety/Cognition/ Behavioral Observations               Pain   Pt c/o pain w/ lower back and hips rating it as a 7/10. PRN Oxy/Tylenol given.   Pt will rate his pain lower than the inital pain level.   Assess pain q shift & PRN.    Skin   Incision to back w/ staples. Blisters to L flank area - foam in place   Incision/blister sites will heal in a timely manner w/o  complications  Assess skin q shift & PRN.      Discharge Planning:  Pt will d/c to home with his sister who has been providing 24/7 care. SW will confirm there are no barriers to discharge.   Team Discussion: Lumbar myelopathy. Working on bladder retraining. Incontinence is new. Pain managed with PRN medications. Blisters to back (stage II) from device healing. Back incision with staples, no drainage, healing. Left flank staples, healing.  Patient on target to meet rehab goals: yes, working towards Mod I goals  *See Care Plan and progress notes for long and short-term goals.   Revisions to Treatment Plan:  U/A  Teaching Needs: Medications, safety, gait/transfer training, self care, skin/wound care, etc.   Current Barriers to Discharge: Decreased caregiver support and Incontinence  Possible Resolutions to Barriers: Family education, bladder/urinal training, order recommended DME     Medical Summary Current Status: having urinary incontinence- so will check U/A and Cx- has condom cath will try to get rid of- teach to use urinal- pain still 5-6/10- stage II due to device?- blisters that popped-  Barriers to Discharge: Uncontrolled Pain;Complicated Wound;Incontinence;Neurogenic Bowel & Bladder;Medical stability;Weight bearing restrictions;Self-care education  Barriers to Discharge Comments: staples- to be removed 5/4- goign home with sister, so need  to get rids of condom cath so sister doesn't need ot manage Possible [facility] to [employer] Focus: new L hip pain since pulled up in bed- cannot reproduce it- willl check U/A and Cx- for urinary Sx's-- goals supervision to mod I with blindness- d/c 5/3   Continued Need for Acute Rehabilitation Level of Care: The patient requires daily medical management by a physician with specialized training in physical medicine and rehabilitation for the following reasons: Direction of a multidisciplinary physical rehabilitation program to maximize  functional independence : Yes Medical management of patient stability for increased activity during participation in an intensive rehabilitation regime.: Yes Analysis of laboratory values and/or radiology reports with any subsequent need for medication adjustment and/or medical intervention. : Yes   I attest that I was present, lead the team conference, and concur with the assessment and plan of the team.   Jearld Adjutant 11/11/2022, 4:59 PM

## 2022-11-11 NOTE — Progress Notes (Signed)
Physical Therapy Session Note  Patient Details  Name: Brian Daniels MRN: [id number] Date of Birth: [birth year]  Today's Date: 11/11/2022 PT Individual Time: 0900-0945, 1130-1200 PT Individual Time Calculation (min): 45 min   Short Term Goals: Week 1:  PT Short Term Goal 1 (Week 1): STG=LTG 2/2 LOS  Skilled Therapeutic Interventions/Progress Updates:    Session 1: pt received in bed and agreeable to therapy. Donned brace with supervision. Donned and doffed jacket with assist. Pt then performed ambulatory transfer to bathroom, clothing management with supervision but no void. Pt performed standing hip flexor stretch x 30 sec each side before progressing to gait, ~200 ft with close supervision fading to heavy CGA with fatigue. On return to room, provided verbal and visual demonstration of importance of RW proximity. Pt reports understanding, and was left with all needs in reach and alarm active.   Session 2: Pt received in recliner and agreeable to therapy.  Pt reports unrated pain in back and L side, premedicated. Rest and positioning provided as needed. Donned brace with set up and verbal cueing. Sit to stand with supervision and RW throughout session. Pt ambulated to/from day room with RW and CGA-close supervision. Pt was able to recall previous cues and apply with minimal reminders. Pt then used nustep on level 5 x 9 min with cueing to maintain RLE on pedal and maintain neutral hip rotation. Pt returned to room in same manner as above, and remained in recliner, was left with all needs in reach and alarm active.    Therapy Documentation Precautions:  Precautions Precautions: Fall, Back Precaution Booklet Issued: No Precaution Comments: legally blind; able to ambulate to bathroom without brace. Brace off in bed. Required Braces or Orthoses: Spinal Brace Spinal Brace: Lumbar corset, Applied in sitting position Restrictions Weight Bearing Restrictions: No General:        Therapy/Group: Individual Therapy  [name] 11/11/2022, 11:46 AM

## 2022-11-11 NOTE — Progress Notes (Signed)
Occupational Therapy Session Note  Patient Details  Name: Brian Daniels MRN: [id number] Date of Birth: 1955-08-13  Today's Date: 11/11/2022 OT Individual Time: 1300-1359 OT Individual Time Calculation (min): 59 min    Short Term Goals: Week 1:  OT Short Term Goal 1 (Week 1): STG = LTGS (d/t ELOS)  Skilled Therapeutic Interventions/Progress Updates:     Pt received sitting up in recliner presenting to be in good spirits reporting 4/10 pain in back with Pt reporting receiving pain medications prior to session- OT offering intermittent rest breaks, repositioning, and therapeutic support maximal participation in session. Pt requesting to take shower this session. Focus this session BADL retraining with emphasis on safety and maintaining back precautions. Rn in/out multiple times during session for vital assessment, to remove condom catheter, obtain urine sample, and assess incisions. Pt able to stand using RW with CGA while using urinal for urine sample at beginning of session. Ambulated to bathroom using RW and transferred to TTB with light min A and mod VB cues d/t underlying visual deficit. Doffed shirt seated on TTB supervision and pants min A to remove from BLEs. Covered incisions prior to shower. Pt completed UB bathing seated with close supervision. Stood while Mudlogger CGA and OT assisted with washing Pt's bottom. Educated Pt on long handled sponge use with Pt able to utilize to wash lower BLEs and feet with min VB cueing. Pt ambulated back to room following shower using RW CGA and sate EOB for dressing tasks. Donned shirt with supervision and pants mod A to weave feet. Pt returned to bed using log rolling technique with mod A to lift BLEs. Educated Pt on urinal use and positioning in bed to increase independence and prevent incontinent voids as Pt has been presenting with sudden urgency to void during hospital stay. Pt able to demonstrate technique with min VB cueing and increased time.  Pt left resting in bed, with call bell in reach, bed alarm on, and all needs met.   Therapy Documentation Precautions:  Precautions Precautions: Fall, Back Precaution Booklet Issued: No Precaution Comments: legally blind; able to ambulate to bathroom without brace. Brace off in bed. Required Braces or Orthoses: Spinal Brace Spinal Brace: Lumbar corset, Applied in sitting position Restrictions Weight Bearing Restrictions: No  Therapy/Group: Individual Therapy  Army Fossa 11/11/2022, 7:48 AM

## 2022-11-12 LAB — URINE CULTURE

## 2022-11-12 NOTE — Progress Notes (Signed)
Removed orders for condom cath. Placed order to bladder train starting every 4 hours with urinal use and promote independence. Also added to work list.

## 2022-11-12 NOTE — Progress Notes (Signed)
Patient ID: Brian Daniels, male   DOB: [birth year], 67 y.o.   MRN: [id number]  SW ordered 3i1n BSC with [employer] via parachute.   SW called pt sister Brian Daniels to inform on DME item ordered. Reports if item is not covered by insurance she can likely get from their other brother.   SW confirms [facility] (p:[phone]/f:[phone]) is accepting new referrals. SW will fax referral.   Cecile Sheerer, MSW, LCSWA Office: [phone] Cell: [phone] Fax: [phone]

## 2022-11-12 NOTE — Progress Notes (Signed)
Occupational Therapy Session Note  Patient Details  Name: Brian Daniels MRN: [id number] Date of Birth: 03-18-1956  Today's Date: 11/12/2022 OT Individual Time: 1315-1415 OT Individual Time Calculation (min): 60 min    Short Term Goals: Week 1:  OT Short Term Goal 1 (Week 1): STG = LTGS (d/t ELOS)  Skilled Therapeutic Interventions/Progress Updates:    OT intervention with focus on bathing at shower level, dressing with sit<>stand from chair, toileting, functional amb with RW, and safety awareness to increase independence with BADLs. Pt follows back precautions without verbal cues. Amb with RW in room with close supervision. Bathing with sit<>stand from TTB with close supervision. Pt completes bathing with CGa using AE appropriately LB dressing with min A. Pt requires assistance threading BLE and donning socks. Pt remained in recliner with seat alarm actiated. All needs within reach. Sister present.   Therapy Documentation Precautions:  Precautions Precautions: Fall, Back Precaution Booklet Issued: No Precaution Comments: legally blind; able to ambulate to bathroom without brace. Brace off in bed. Required Braces or Orthoses: Spinal Brace Spinal Brace: Lumbar corset, Applied in sitting position Restrictions Weight Bearing Restrictions: No Pain:  Pt c/o 5/10 low back pain and tenderness; warm shower and repositioned, RN aware   Therapy/Group: Individual Therapy  Rich Brave 11/12/2022, 2:32 PM

## 2022-11-12 NOTE — Progress Notes (Signed)
PROGRESS NOTE   Subjective/Complaints:  Pt reports still going frequently- on Keflex for UTI- went over that had one/UTI and these Sx's should improve once treated- will need to go home on Keflex for 4-5 days.   Just had BM- c/o tightness of abdomen on and off- is better- sounds like due to constipation?  ROS:  Pt denies SOB, abd pain, CP, N/V/C/D Except for HPI   Objective:   No results found. Recent Labs    11/10/22 0554  WBC 6.7  HGB 10.8*  HCT 32.2*  PLT 288    Recent Labs    11/10/22 0554  NA 138  K 3.4*  CL 104  CO2 26  GLUCOSE 96  BUN 15  CREATININE 0.94  CALCIUM 8.4*     Intake/Output Summary (Last 24 hours) at 11/12/2022 1843 Last data filed at 11/12/2022 1735 Gross per 24 hour  Intake 480 ml  Output 1250 ml  Net -770 ml     Pressure Injury 11/04/22 Back Left;Medial Stage 2 -  Partial thickness loss of dermis presenting as a shallow open injury with a red, pink wound bed without slough. blister/ device related (Active)  11/04/22 1440  Location: Back  Location Orientation: Left;Medial  Staging: Stage 2 -  Partial thickness loss of dermis presenting as a shallow open injury with a red, pink wound bed without slough.  Wound Description (Comments): blister/ device related  Present on Admission: Yes     Pressure Injury 11/04/22 Lumbar Lateral;Right Stage 2 -  Partial thickness loss of dermis presenting as a shallow open injury with a red, pink wound bed without slough. blister/ device related (Active)  11/04/22 1340  Location: Lumbar  Location Orientation: Lateral;Right  Staging: Stage 2 -  Partial thickness loss of dermis presenting as a shallow open injury with a red, pink wound bed without slough.  Wound Description (Comments): blister/ device related  Present on Admission: Yes    Physical Exam: Vital Signs Blood pressure 131/70, pulse (!) 58, temperature 97.9 F (36.6 C), temperature  source Oral, resp. rate 16, height 5\' 10"  (1.778 m), weight 80.3 kg, SpO2 99 %.          General: awake, alert, appropriate, sitting up in bedside chair, eating breakfast; NAD HENT:  oropharynx moist CV: regular rate; no JVD Pulmonary: CTA B/L; no W/R/R- good air movement GI: soft, NT, ND, (+)BS Psychiatric: appropriate Neurological: Ox3  Skin; -popped blister midback- and right next to back incision- honeycomb on back incision-- when peel back foam dressing, skin peels some with it- so didn't remove-  GU_ condom cath in place- light amber urine in bag Musculoskeletal:     Cervical back: Neck supple. No tenderness.     Right lower leg: No edema.     Left lower leg: No edema.     Comments: Ue's 5/5 B/L RLE- 5-/5 except HF on R 4+/5 LLE- 5-5/5 except L HF 4+/5  Skin:    General: Skin is warm and dry.     Comments: PIV in dorsum of each hand without erythema Lumbar incision and L groin/abd incision with honeycomb dressing in place- look OK  Neurological:  Mental Status: He is alert and oriented to person, place, and time.     Comments: Intact to light touch in Ue's and torso L1 numbness and tingling L>R; toherwise intact to light touch in all 4 extremities   Assessment/Plan: 1. Functional deficits which require 3+ hours per day of interdisciplinary therapy in a comprehensive inpatient rehab setting. Physiatrist is providing close team supervision and 24 hour management of active medical problems listed below. Physiatrist and rehab team continue to assess barriers to discharge/monitor patient progress toward functional and medical goals  Care Tool:  Bathing    Body parts bathed by patient: Right arm, Left arm, Chest, Abdomen, Front perineal area, Buttocks, Right upper leg, Left upper leg, Face, Right lower leg, Left lower leg   Body parts bathed by helper: Right lower leg, Left lower leg     Bathing assist Assist Level: Contact Guard/Touching assist     Upper Body  Dressing/Undressing Upper body dressing   What is the patient wearing?: Pull over shirt, Orthosis Orthosis activity level: Performed by helper  Upper body assist Assist Level: Set up assist    Lower Body Dressing/Undressing Lower body dressing      What is the patient wearing?: Pants, Underwear/pull up     Lower body assist Assist for lower body dressing: Minimal Assistance - Patient > 75%     Toileting Toileting    Toileting assist Assist for toileting: Minimal Assistance - Patient > 75%     Transfers Chair/bed transfer  Transfers assist     Chair/bed transfer assist level: Contact Guard/Touching assist     Locomotion Ambulation   Ambulation assist      Assist level: Minimal Assistance - Patient > 75% Assistive device: Walker-rolling Max distance: 67   Walk 10 feet activity   Assist     Assist level: Minimal Assistance - Patient > 75% Assistive device: Walker-rolling   Walk 50 feet activity   Assist Walk 50 feet with 2 turns activity did not occur: Safety/medical concerns (2/2 endurance)  Assist level: Minimal Assistance - Patient > 75% Assistive device: Walker-rolling    Walk 150 feet activity   Assist Walk 150 feet activity did not occur: Safety/medical concerns (2/2 endurance)         Walk 10 feet on uneven surface  activity   Assist Walk 10 feet on uneven surfaces activity did not occur: Safety/medical concerns (2/2 endurance)         Wheelchair     Assist Is the patient using a wheelchair?: No   Wheelchair activity did not occur: N/A         Wheelchair 50 feet with 2 turns activity    Assist    Wheelchair 50 feet with 2 turns activity did not occur: N/A       Wheelchair 150 feet activity     Assist  Wheelchair 150 feet activity did not occur: N/A       Blood pressure 131/70, pulse (!) 58, temperature 97.9 F (36.6 C), temperature source Oral, resp. rate 16, height 5\' 10"  (1.778 m), weight 80.3  kg, SpO2 99 %.  Medical Problem List and Plan: 1. Functional deficits secondary to lumbar myelopathy             -patient may  shower if cover incisions             -ELOS/Goals: 7-10 mod I   Due to get Staples out next Saturday 5/4  D/c 5/3- due to get staples out  5/4- will assess Friday AM and hopefully can remove  Con't CIR PT and OT 2.  Antithrombotics: -DVT/anticoagulation:  Mechanical:  Antiembolism stockings, knee (TED hose) Bilateral lower extremities  4/27- is 7 days, but waiting to hear back from [facility]  4/28- got approval from [facility]- started lovenox for now             -antiplatelet therapy: none             -follow up BLE venous duplex   3. Pain Management: Tylenol, baclofen, Robaxin, oxycodone as needed   4/29- 4/30pain controlled when takes pain meds- usually 4-5/10 without meds- less when takes meds  5/1- will go home with meds- and will then wean them 4. Mood/Behavior/Sleep: LCSW to evaluate and provide emotional support             -antipsychotic agents: n/a   5. Neuropsych/cognition: This patient is capable of making decisions on his own behalf.   6. Skin/Wound Care: Routine skin care checks  4/27- Has 2 stage II's from blisters near both incisions- foam and monitoring closely-  7. 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   11: DM-2: CBGs four times daily; carb modified diet; A!c 6.2% on 4/09             -continue metformin 500 mg with supper             -continue SSI   4/25- BG's controlled- con't regimen  4/27- so controlled- will drop back to BID-AC  4/28- Will stop  CBG's since doing so well 12: Glaucoma: continue gtts X 4  4/24- got contacted to restart drops- did    13: History of gout: no reports of flare   14: Urinary hesitancy/frequency- will check PVRs/bladder scans   4/25- Will use condom cath since he cannot use urinal due to blindness  4/30- will check U/A and Cx- to make sure no UTI- having a lot of frequency and dribbling in between voiding.  15: Constipation: continue regimen; PRNs ordered  4/24- LBM 2 days ago- if no BM by tomorrow, will intervene  4/25- LBM yesterday- denies more constipaiton  4/26- LBM 2 days ago- has been refusing bowel meds- passed gas and thought had incontinent BM yesterday- only gas- asked him to take bowel meds today but will wait to give Miralax til noon. 4/27- good BM yesterday and needs to go this AM  4/28- LBM yesterday-will stop Colace- make Senna 1 tab daily prn and ocn't Miralax  4/29- having regular BM's- pt doesn't want to take bowel meds- explained will need some due to pain meds 4.30- LBM yesterday-   16. Blisters/stage II back/L flank  4/26- pt's back blister popped this AM; other one intact- will con't foam dressing and dry dressing on back- honeycomb came off- sutures intact- looks good  4/27- to get Staples out 5/4  4/30- con't foam dressing- will d/c honeycomb dressing today  17. Hypokalemia  4/29- K+  3.4- will replete 40 mEq x1 18. - has UTI- started Keflex 500 mg q8 hours- ?100k e coli- pending sensitivities Still going frequently- esp when stands up- condom cath stopped so can learn how to use urinal while here.   I spent a total of 41   minutes on total care today- >50% coordination of care- due to  D/w SW and pt about sister wanting to speak with me- wasn't able to reach her- will try tomorrow.   LOS: 8 days A FACE TO FACE EVALUATION WAS PERFORMED  Mikhaela Zaugg 11/12/2022, 6:43 PM

## 2022-11-12 NOTE — Progress Notes (Signed)
Physical Therapy Session Note  Patient Details  Name: Brian Daniels MRN: [id number] Date of Birth: 1955-10-28  Today's Date: 11/12/2022 PT Individual Time: 0920-1030 PT Individual Time Calculation (min): 70 min   Short Term Goals: Week 1:  PT Short Term Goal 1 (Week 1): STG=LTG 2/2 LOS  Skilled Therapeutic Interventions/Progress Updates: Pt presented in recliner agreeable to therapy. Pt c/o mild pain at incision area, rest breaks and repositiong providing during session. PTA donned socks/shoes total A for time management. Pt donned LSO with set up. Pt then ambulated to main gym with RW and CGA with PTA providing verbal cues for directions. PTA then set up mat and assisted pt to floor. With increased time/effort pt performed floor transfer to mat with minA overall. Discussed options if fall occurred (checking if LOC, any injuries, call fire dept). Pt also states he has Life Alert and recommend he continues to use upon d/c. Pt then reviewed step up onto platform to simulate home entry. Pt was able to perform x 2 with CGA and verbal cues for sequencing. Pt then ambulated to day room with CGA with PTA noting mild intermittent knee buckling. Pt then participated in NuStep activity starting at L3 x 6 min using x 4 extremities then progressing to BLE only L2 x 2 min then L4 x 2 min. Due to increased fatigue, pt then transferred back to room via w/c and upon entry into room performed ambulatory transfer to bathroom per pt request. Pt was able to complete clothing management with set up. Pt left at toilet with pull cord in hand and nsg notified of pt's disposition.      Therapy Documentation Precautions:  Precautions Precautions: Fall, Back Precaution Booklet Issued: No Precaution Comments: legally blind; able to ambulate to bathroom without brace. Brace off in bed. Required Braces or Orthoses: Spinal Brace Spinal Brace: Lumbar corset, Applied in sitting position Restrictions Weight Bearing  Restrictions: No General:   Vital Signs: Therapy Vitals Temp: 97.9 F (36.6 C) Temp Source: Oral Pulse Rate: (!) 58 Resp: 16 BP: 131/70 Patient Position (if appropriate): Sitting Oxygen Therapy SpO2: 99 % O2 Device: Room Air Pain: Pain Assessment Pain Scale: 0-10 Pain Score: 7     Therapy/Group: Individual Therapy  Cortny Bambach 11/12/2022, 4:29 PM

## 2022-11-12 NOTE — Progress Notes (Signed)
Occupational Therapy Session Note  Patient Details  Name: Brian Daniels MRN: [id number] Date of Birth: 06/28/1956  Today's Date: 11/12/2022 OT Individual Time: 0700-0810 OT Individual Time Calculation (min): 70 min    Short Term Goals: Week 1:  OT Short Term Goal 1 (Week 1): STG = LTGS (d/t ELOS)  Skilled Therapeutic Interventions/Progress Updates:    Pt resting in bed upon arrival. Pt requested to use bathroom. Pt independently recalled log roll technique for supine>sit EOB. Pt completed supine>sit with CGA. Pt required min A for donning LSO. Pt amb with RW to bathroom with close supervision and min verbal cues for navigation in room. Toileting with min A. Pt returned to room and stood at sink to wash hands before returning to recliner. Pt completed self feeding after setup. Reviewed back precautions. OTA adjusted LSO since it was not fitting properly. Reviewed donning/doffing LSO. Pt practiced sit<>stand from recliner with supervision x 5. Reviewed progress and LTGs. Pt pleased with progress. Plan to take shower duirng afternoon therapy. Pt remained in recliner with all needs within reach. Seat alarm activated.    Therapy Documentation Precautions:  Precautions Precautions: Fall, Back Precaution Booklet Issued: No Precaution Comments: legally blind; able to ambulate to bathroom without brace. Brace off in bed. Required Braces or Orthoses: Spinal Brace Spinal Brace: Lumbar corset, Applied in sitting position Restrictions Weight Bearing Restrictions: No Pain: Pt c/o 6/10 low back pain; meds admin by RN during session; repositioned    Therapy/Group: Individual Therapy  Rich Brave 11/12/2022, 8:11 AM

## 2022-11-13 NOTE — Progress Notes (Signed)
PROGRESS NOTE   Subjective/Complaints:  Pt reports voided this AM, but then felt like needed to go again- wasn't able to- but feels "full".   Asked nursing to bladder scan pt.  OT reports pt wasn't able to void when tried this AM- after had voided.   LBM yesterday- small- denies feeling constipated.   ROS:  Pt denies SOB, abd pain, CP, N/V/C/D Except for HPI   Objective:   No results found. No results for input(s): "WBC", "HGB", "HCT", "PLT" in the last 72 hours.   No results for input(s): "NA", "K", "CL", "CO2", "GLUCOSE", "BUN", "CREATININE", "CALCIUM" in the last 72 hours.    Intake/Output Summary (Last 24 hours) at 11/13/2022 0816 Last data filed at 11/13/2022 1610 Gross per 24 hour  Intake 598 ml  Output 1250 ml  Net -652 ml     Pressure Injury 11/04/22 Back Left;Medial Stage 2 -  Partial thickness loss of dermis presenting as a shallow open injury with a red, pink wound bed without slough. blister/ device related (Active)  11/04/22 1440  Location: Back  Location Orientation: Left;Medial  Staging: Stage 2 -  Partial thickness loss of dermis presenting as a shallow open injury with a red, pink wound bed without slough.  Wound Description (Comments): blister/ device related  Present on Admission: Yes     Pressure Injury 11/04/22 Lumbar Lateral;Right Stage 2 -  Partial thickness loss of dermis presenting as a shallow open injury with a red, pink wound bed without slough. blister/ device related (Active)  11/04/22 1340  Location: Lumbar  Location Orientation: Lateral;Right  Staging: Stage 2 -  Partial thickness loss of dermis presenting as a shallow open injury with a red, pink wound bed without slough.  Wound Description (Comments): blister/ device related  Present on Admission: Yes    Physical Exam: Vital Signs Blood pressure (!) 141/68, pulse 66, temperature 98.3 F (36.8 C), temperature source Oral,  resp. rate 17, height 5\' 10"  (1.778 m), weight 80.3 kg, SpO2 98 %.           General: awake, alert, appropriate, sitting up in bedside chair- just got back from bathroom with OT; NAD HENT: whitish lenses form glaucoma;; oropharynx moist CV: regular rate; no JVD Pulmonary: CTA B/L; no W/R/R- good air movement GI: soft, NT, ND, (+)BS- doesn't appear distended  Psychiatric: appropriate Neurological: Ox3   Skin; -popped blister midback- and right next to back incision- honeycomb on back incision-- when peel back foam dressing, skin peels some with it- so didn't remove-  GU_ condom cath in place- light amber urine in bag Musculoskeletal:     Cervical back: Neck supple. No tenderness.     Right lower leg: No edema.     Left lower leg: No edema.     Comments: Ue's 5/5 B/L RLE- 5-/5 except HF on R 4+/5 LLE- 5-5/5 except L HF 4+/5  Skin:    General: Skin is warm and dry.     Comments: PIV in dorsum of each hand without erythema Lumbar incision and L groin/abd incision with honeycomb dressing in place- look OK  Neurological:     Mental Status: He is alert and oriented  to person, place, and time.     Comments: Intact to light touch in Ue's and torso L1 numbness and tingling L>R; toherwise intact to light touch in all 4 extremities   Assessment/Plan: 1. Functional deficits which require 3+ hours per day of interdisciplinary therapy in a comprehensive inpatient rehab setting. Physiatrist is providing close team supervision and 24 hour management of active medical problems listed below. Physiatrist and rehab team continue to assess barriers to discharge/monitor patient progress toward functional and medical goals  Care Tool:  Bathing    Body parts bathed by patient: Right arm, Left arm, Chest, Abdomen, Front perineal area, Buttocks, Right upper leg, Left upper leg, Face, Right lower leg, Left lower leg   Body parts bathed by helper: Right lower leg, Left lower leg     Bathing  assist Assist Level: Contact Guard/Touching assist     Upper Body Dressing/Undressing Upper body dressing   What is the patient wearing?: Pull over shirt, Orthosis Orthosis activity level: Performed by helper  Upper body assist Assist Level: Set up assist    Lower Body Dressing/Undressing Lower body dressing      What is the patient wearing?: Pants, Underwear/pull up     Lower body assist Assist for lower body dressing: Minimal Assistance - Patient > 75%     Toileting Toileting    Toileting assist Assist for toileting: Minimal Assistance - Patient > 75%     Transfers Chair/bed transfer  Transfers assist     Chair/bed transfer assist level: Contact Guard/Touching assist     Locomotion Ambulation   Ambulation assist      Assist level: Minimal Assistance - Patient > 75% Assistive device: Walker-rolling Max distance: 67   Walk 10 feet activity   Assist     Assist level: Minimal Assistance - Patient > 75% Assistive device: Walker-rolling   Walk 50 feet activity   Assist Walk 50 feet with 2 turns activity did not occur: Safety/medical concerns (2/2 endurance)  Assist level: Minimal Assistance - Patient > 75% Assistive device: Walker-rolling    Walk 150 feet activity   Assist Walk 150 feet activity did not occur: Safety/medical concerns (2/2 endurance)         Walk 10 feet on uneven surface  activity   Assist Walk 10 feet on uneven surfaces activity did not occur: Safety/medical concerns (2/2 endurance)         Wheelchair     Assist Is the patient using a wheelchair?: No   Wheelchair activity did not occur: N/A         Wheelchair 50 feet with 2 turns activity    Assist    Wheelchair 50 feet with 2 turns activity did not occur: N/A       Wheelchair 150 feet activity     Assist  Wheelchair 150 feet activity did not occur: N/A       Blood pressure (!) 141/68, pulse 66, temperature 98.3 F (36.8 C),  temperature source Oral, resp. rate 17, height 5\' 10"  (1.778 m), weight 80.3 kg, SpO2 98 %.  Medical Problem List and Plan: 1. Functional deficits secondary to lumbar myelopathy             -patient may  shower if cover incisions             -ELOS/Goals: 7-10 mod I   Due to get Staples out next Saturday 5/4  D/c 5/3- due to get staples out 5/4- will assess Friday AM and hopefully  can remove  Con't CIR PT and OT- might not be able to d/c tomorrow if voiding not better-  2.  Antithrombotics: -DVT/anticoagulation:  Mechanical:  Antiembolism stockings, knee (TED hose) Bilateral lower extremities  4/27- is 7 days, but waiting to hear back from [facility]  4/28- got approval from [facility]- started lovenox for now             -antiplatelet therapy: none             -follow up BLE venous duplex   3. Pain Management: Tylenol, baclofen, Robaxin, oxycodone as needed   4/29- 4/30pain controlled when takes pain meds- usually 4-5/10 without meds- less when takes meds  5/1- will go home with meds- and will then wean them 4. Mood/Behavior/Sleep: LCSW to evaluate and provide emotional support             -antipsychotic agents: n/a   5. Neuropsych/cognition: This patient is capable of making decisions on his own behalf.   6. Skin/Wound Care: Routine skin care checks  4/27- Has 2 stage II's from blisters near both incisions- foam and monitoring closely-  7. 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   11: DM-2: CBGs four times daily; carb modified diet; A!c 6.2% on 4/09             -continue metformin 500 mg with supper              -continue SSI   4/25- BG's controlled- con't regimen  4/27- so controlled- will drop back to BID-AC  4/28- Will stop CBG's since doing so well 12: Glaucoma: continue gtts X 4  4/24- got contacted to restart drops- did    13: History of gout: no reports of flare   14: Urinary hesitancy/frequency- will check PVRs/bladder scans   4/25- Will use condom cath since he cannot use urinal due to blindness  4/30- will check U/A and Cx- to make sure no UTI- having a lot of frequency and dribbling in between voiding.   5/2- might now have urinary retention-  15: Constipation: continue regimen; PRNs ordered 4/28- LBM yesterday-will stop Colace- make Senna 1 tab daily prn and ocn't Miralax  4/29- having regular BM's- pt doesn't want to take bowel meds- explained will need some due to pain meds 4.30- LBM yesterday-  5/2- LB< yesterday- small-   16. Blisters/stage II back/L flank  4/26- pt's back blister popped this AM; other one intact- will con't foam dressing and dry dressing on back- honeycomb came off- sutures intact- looks good  4/27- to get Staples out 5/4  4/30- con't foam dressing- will d/c honeycomb dressing today  17. Hypokalemia  4/29- K+ 3.4- will replete 40 mEq x1 18. -  UTI- started Keflex 500 mg q8 hours- ?100k e coli- pending sensitivities Still going frequently- esp when stands up- condom cath stopped so can learn how to use urinal while here.   5/2- having possible urinary retention- peed this  AM, but felt like still full so went again- couldn't void-ordered bladder scans q6 hours and cath if volumes >350cc- might need Flmoax? Due to UTI likely.     I spent a total of 52    minutes on total care today- >50% coordination of care- due to  D/w sister about issues; d/w OT to not d/c pt yet- might need to stay due to possible urinary retention- called and spoke to nursing to bladder scan pt ASAP and q6- might need cathing- if retaining will add Flomax.   LOS: 9 days A FACE  TO FACE EVALUATION WAS PERFORMED  Grabiel Schmutz 11/13/2022, 8:16 AM

## 2022-11-13 NOTE — Progress Notes (Signed)
Occupational Therapy Session Note  Patient Details  Name: Brian Daniels MRN: [id number] Date of Birth: 1955-08-09  Today's Date: 11/13/2022 OT Individual Time: 3244-0102 OT Individual Time Calculation (min): 73 min    Short Term Goals: Week 1:  OT Short Term Goal 1 (Week 1): STG = LTGS (d/t ELOS)  Skilled Therapeutic Interventions/Progress Updates:    Pt resting in bed upon arrival. Pt's breakfast had been delivered and placed on bedside table. No setup had been provided. Supine>sit EOB with supervision. Pt donned LSO with setup and verbal cues.Pt requested to use bathroom and amb with RW to bathroom with supervision.  Toileiting tasks with mod I. Pt returned to room to complete grooming and sit in recliner to eat breakfast. Mod I for self feeding. Sit<>stand and standing balance tasks/activities in room with supervision. Reviewed back precautions and DME. Pt currently used tub seat and steps over into tub. Pt had practiced TTB transfers and voiced preference for TTB use vs stepping into tub. Info to be provided during 2nd OT session. Reviewed back precautions and home safety. Pt's home is setup so pt can navigate without assistance. Pt remained in recliner. All needs within reach. Seat alar activated.   Therapy Documentation Precautions:  Precautions Precautions: Fall, Back Precaution Booklet Issued: No Precaution Comments: legally blind; able to ambulate to bathroom without brace. Brace off in bed. Required Braces or Orthoses: Spinal Brace Spinal Brace: Lumbar corset, Applied in sitting position Restrictions Weight Bearing Restrictions: No General:   Vital Signs: Therapy Vitals Temp: 98.3 F (36.8 C) Temp Source: Oral Pulse Rate: 66 Resp: 17 BP: (!) 141/68 Patient Position (if appropriate): Lying Oxygen Therapy SpO2: 98 % O2 Device: Room Air Pain: Pain Assessment Pain Scale: 0-10 Pain Score: 0-No pain ADL: ADL Eating: Set up Where Assessed-Eating:  Wheelchair Grooming: Supervision/safety, Setup Where Assessed-Grooming: Standing at sink Upper Body Bathing: Setup Where Assessed-Upper Body Bathing: Shower Lower Body Bathing: Minimal assistance Where Assessed-Lower Body Bathing: Shower Upper Body Dressing: Setup Where Assessed-Upper Body Dressing: Chair Lower Body Dressing: Moderate assistance Where Assessed-Lower Body Dressing: Chair Toileting: Minimal assistance Where Assessed-Toileting: Toilet, Psychiatrist Transfer: Furniture conservator/restorer Method: Proofreader: Bedside commode, Grab bars Tub/Shower Transfer: Unable to assess Tub/Shower Transfer Method: Unable to assess Film/video editor: Administrator, arts Method: Designer, industrial/product: Emergency planning/management officer, Engineer, production   Exercises:   Other Treatments:     Therapy/Group: Individual Therapy  Rich Brave 11/13/2022, 8:14 AM

## 2022-11-13 NOTE — Progress Notes (Signed)
Occupational Therapy Discharge Summary  Patient Details  Name: Brian Daniels MRN: [id number] Date of Birth: July 05, 1956  Date of Discharge from OT service:Nov 13, 2022   Patient has met 6 of 7 long term goals due to improved activity tolerance, improved balance, postural control, and ability to compensate for deficits.  Pt made steady progress with BADLs and functional transfers during this admission. Pt is able to doff/don LSO after setup. Pt completes bathing tasks at mod I. Supervision for toilet transfers in unfamiliar envirionment and mod I for toileting. Pt equires min A to don socks/shoes. Pt's sister has been present for therapy and provides the appropriate level of assistance/supervision. Patient to discharge at overall Supervision level.  Patient's care partner is independent to provide the necessary physical assistance at discharge.    Reasons goals not met: Pt requires min A for donning socks/shoes using AE  Recommendation:  Patient will benefit from ongoing skilled OT services in home health setting to continue to advance functional skills in the area of BADL, iADL, and Reduce care partner burden.  Equipment: BSC  Reasons for discharge: treatment goals met and discharge from hospital  Patient/family agrees with progress made and goals achieved: Yes  OT Discharge ADL ADL Equipment Provided: Reacher, Sock aid, Long-handled sponge Eating: Set up Where Assessed-Eating: Wheelchair Grooming: Modified independent Where Assessed-Grooming: Sitting at sink Upper Body Bathing: Modified independent Where Assessed-Upper Body Bathing: Shower Lower Body Bathing: Modified independent Where Assessed-Lower Body Bathing: Shower Upper Body Dressing: Setup Where Assessed-Upper Body Dressing: Chair Lower Body Dressing: Minimal assistance Where Assessed-Lower Body Dressing: Chair Toileting: Modified independent Where Assessed-Toileting: Toilet, Psychiatrist Transfer: Distant  supervision Toilet Transfer Method: Proofreader: Bedside commode, Grab bars Tub/Shower Transfer: Unable to assess Tub/Shower Transfer Method: Unable to assess Film/video editor: Distant supervision Film/video editor Method: Designer, industrial/product: Emergency planning/management officer, Grab bars Vision Baseline Vision/History: 2 Legally blind Patient Visual Report: No change from baseline Vision Assessment?: Yes Perception  Perception: Not tested Praxis Praxis: Not tested [employer] Overall Cognitive Status: Within Functional Limits for tasks assessed Arousal/Alertness: Awake/alert Orientation Level: Person;Place;Situation Person: Oriented Place: Oriented Situation: Oriented Memory: Appears intact Attention: Sustained Sustained Attention: Appears intact Awareness: Appears intact Problem Solving: Appears intact Brief Interview for Mental Status (BIMS) Repetition of Three Words (First Attempt): 3 Temporal Orientation: Year: Correct Temporal Orientation: Month: Accurate within 5 days Temporal Orientation: Day: Correct Recall: "Sock": Yes, no cue required Recall: "Blue": Yes, no cue required Recall: "Bed": Yes, no cue required BIMS Summary Score: 15 Sensation Sensation Light Touch: Impaired Detail Light Touch Impaired Details: Impaired RLE;Impaired LLE;Impaired LUE;Impaired RUE Hot/Cold: Impaired Detail Hot/Cold Impaired Details: Impaired LUE;Impaired RUE;Impaired RLE;Impaired LLE Additional Comments: Reports fluctuating sensation impairment in BUE and BLE. Reports that BLE are worse. Motor  Motor Motor: Within Functional Limits Trunk/Postural Assessment  Cervical Assessment Cervical Assessment: Within Functional Limits Thoracic Assessment Thoracic Assessment: Within Functional Limits Postural Control Postural Limitations: bacl surgery, weakness, pain  Balance Static Sitting Balance Static Sitting - Balance Support: Feet  supported Static Sitting - Level of Assistance: 6: Modified independent (Device/Increase time) Dynamic Sitting Balance Dynamic Sitting - Balance Support: During functional activity Dynamic Sitting - Level of Assistance: 6: Modified independent (Device/Increase time) Extremity/Trunk Assessment RUE Assessment RUE Assessment: Within Functional Limits LUE Assessment LUE Assessment: Within Functional Limits   Rich Brave 11/13/2022, 12:27 PM

## 2022-11-13 NOTE — Progress Notes (Signed)
Physical Therapy Session Note  Patient Details  Name: Brian Daniels MRN: [id number] Date of Birth: [birth year]  Today's Date: 11/13/2022 PT Individual Time: 1430-1500 PT Individual Time Calculation (min): 30 min   Short Term Goals: Week 1:  PT Short Term Goal 1 (Week 1): STG=LTG 2/2 LOS  Skilled Therapeutic Interventions/Progress Updates:      Pt seated in WC with back brace on and sister in room. Pt agreeable to therapy. Pt denies any pain. Pt reports need to use bathroom.   Pt performed sit<>stand with RW and supervision. Pt ambulated with RW to bathroom with supervision, verbal cues provided for obstacle negotiation due to blindness.   Pt donned/doff pants and performed pericare with max A as pt reports he is unable to perform himself. Pt continent of bladder and bowel (nurse in room, nurse documented). Pt ambulated from bathroom to recliner with RW and supervision. Nurse in room to check pt blisters on back, pt performed sit<>stand x2 with supervision, verbal cuing provided to scoot forward.   Pt sister asking if she is cleared to assist pt in bathroom until discharged. Pt returned demonstration of toileting, as Therapist assessed pt ability to provide supervision with sit<>stand and ambulation with RW, pt demos improved recall of no bending, lifting twisting. Updated safety board, as pt sister is cleared to provide assistance with transfer, ambulation with RW in room, and toileting.   Pt seated in recliner with seat belt alarm on and nurse and sister in room at end of session.   Therapy Documentation Precautions:  Precautions Precautions: Fall, Back Precaution Booklet Issued: No Precaution Comments: legally blind; able to ambulate to bathroom without brace. Brace off in bed. Required Braces or Orthoses: Spinal Brace Spinal Brace: Lumbar corset, Applied in sitting position Restrictions Weight Bearing Restrictions: No      Therapy/Group: Individual Therapy  [facility] Ambrose Finland, Dresser, DPT  11/13/2022, 7:54 AM

## 2022-11-13 NOTE — Progress Notes (Signed)
Physical Therapy Discharge Summary  Patient Details  Name: Brian Daniels MRN: [id number] Date of Birth: 26-Jul-1955  Date of Discharge from PT service:Nov 13, 2022  Today's Date: 11/13/2022 PT Individual Time: 1330-1420 PT Individual Time Calculation (min): 50 min    Patient has met 7 of 7 long term goals due to improved activity tolerance, improved balance, improved postural control, increased strength, and ability to compensate for deficits.  Patient to discharge at an ambulatory level Supervision.   Patient's care partner is independent to provide the necessary physical assistance at discharge. Pt to d/c home with his sister who has been present for education during multiple therapy sessions with this Clinical research associate.   Reasons goals not met: NA  Recommendation:  Patient will benefit from ongoing skilled PT services in home health setting to continue to advance safe functional mobility, address ongoing impairments in strength, balance, activity tolerance, and minimize fall risk.  Equipment: No equipment provided  Reasons for discharge: treatment goals met and discharge from hospital  Patient/family agrees with progress made and goals achieved: Yes  Skilled Therapeutic Interventions/Progress Updates: Pt recd in recliner with his sister present. Pt with chronic pain levels that did not interfere with therapy. Session focused on family education with pt's sister. Provided education on providing appropriate supervision. Pt ambulated with RW and supervision, cues for upright posture. Pt also performed car transfer in the same manner. Pt navigated ramp with RW and stairs x 12 with 2 hand rails, but was fatigued after stair navigation. Pt returned to room and remained in w/c, was left with all needs in reach and alarm active.   PT Discharge Precautions/Restrictions Precautions Precautions: Fall;Back Precaution Booklet Issued: No Precaution Comments: legally blind; able to ambulate to bathroom  without brace. Brace off in bed. Required Braces or Orthoses: Spinal Brace Spinal Brace: Lumbar corset;Applied in sitting position Restrictions Weight Bearing Restrictions: No Vital Signs Therapy Vitals Temp: 97.6 F (36.4 C) Temp Source: Oral Pulse Rate: 67 Resp: 16 BP: 131/67 Patient Position (if appropriate): Sitting Oxygen Therapy SpO2: 100 % O2 Device: Room Air Pain Pain Assessment Faces Pain Scale: Hurts a little bit Pain Interference Pain Interference Pain Effect on Sleep: 1. Rarely or not at all Pain Interference with Therapy Activities: 1. Rarely or not at all Pain Interference with Day-to-Day Activities: 1. Rarely or not at all Vision/Perception  Vision - History Ability to See in Adequate Light: 3 Highly impaired Perception Perception: Within Functional Limits Praxis Praxis: Intact  Cognition Overall Cognitive Status: Within Functional Limits for tasks assessed Arousal/Alertness: Awake/alert Orientation Level: Oriented X4 Attention: Sustained Sustained Attention: Appears intact Memory: Appears intact Awareness: Appears intact Problem Solving: Appears intact Safety/Judgment: Appears intact Sensation Sensation Light Touch: Impaired Detail Light Touch Impaired Details: Impaired RLE;Impaired LLE;Impaired LUE;Impaired RUE Hot/Cold: Impaired Detail Hot/Cold Impaired Details: Impaired LUE;Impaired RUE;Impaired RLE;Impaired LLE Additional Comments: Reports fluctuating sensation impairment in BUE and BLE. Reports that BLE are worse. Coordination Gross Motor Movements are Fluid and Coordinated: Yes (limited by stiffness) Motor  Motor Motor: Within Functional Limits  Mobility Bed Mobility Bed Mobility: Rolling Right;Right Sidelying to Sit Rolling Right: Independent with assistive device Right Sidelying to Sit: Independent with assistive device Transfers Transfers: Sit to Stand;Stand to Sit;Stand Pivot Transfers Sit to Stand: Supervision/Verbal  cueing Stand to Sit: Supervision/Verbal cueing Stand Pivot Transfers: Supervision/Verbal cueing Transfer (Assistive device): Rolling walker Locomotion  Gait Ambulation: Yes Gait Assistance: Supervision/Verbal cueing Gait Distance (Feet): 200 Feet Assistive device: Rolling walker Gait Assistance Details: Verbal cues for gait  pattern;Verbal cues for technique Gait Assistance Details: verbal cues for upright posture, RW proximity Gait Gait: Yes Gait Pattern: Impaired Gait Pattern: Right flexed knee in stance;Left flexed knee in stance;Trunk flexed Stairs / Additional Locomotion Stairs: Yes Stairs Assistance: Supervision/Verbal cueing Stair Management Technique: Two rails Number of Stairs: 12 Height of Stairs: 6 Ramp: Supervision/Verbal cueing Pick up small object from the floor assist level: Supervision/Verbal cueing Pick up small object from the floor assistive device: RW and Engineer, manufacturing Wheelchair Mobility: No  Trunk/Postural Assessment  Cervical Assessment Cervical Assessment: Within Functional Limits Thoracic Assessment Thoracic Assessment: Within Functional Limits Lumbar Assessment Lumbar Assessment: Exceptions to [facility] Postural Control Postural Control: Deficits on evaluation Postural Limitations: bacl surgery, weakness, pain  Balance Balance Balance Assessed: Yes Static Sitting Balance Static Sitting - Balance Support: Feet supported Static Sitting - Level of Assistance: 6: Modified independent (Device/Increase time) Dynamic Sitting Balance Dynamic Sitting - Balance Support: During functional activity Dynamic Sitting - Level of Assistance: 6: Modified independent (Device/Increase time) Static Standing Balance Static Standing - Balance Support: Bilateral upper extremity supported Static Standing - Level of Assistance: 5: Stand by assistance Extremity Assessment  RUE Assessment RUE Assessment: Within Functional Limits LUE Assessment LUE Assessment:  Within Functional Limits RLE Assessment RLE Assessment: Within Functional Limits General Strength Comments: gross 5/5, hip fl 4/5 LLE Assessment LLE Assessment: Within Functional Limits General Strength Comments: gross 5/5, hip fl 4/5   Cleo Villamizar C Dedric Ethington 11/13/2022, 3:53 PM

## 2022-11-13 NOTE — Progress Notes (Signed)
Occupational Therapy Session Note  Patient Details  Name: Brian Daniels MRN: [id number] Date of Birth: [birth year]  Today's Date: 11/13/2022 OT Individual Time: 1050-1200 OT Individual Time Calculation (min): 70 min    Short Term Goals: Week 1:  OT Short Term Goal 1 (Week 1): STG = LTGS (d/t ELOS)  Skilled Therapeutic Interventions/Progress Updates:    OT intervention with focus on BADLs, functional amb with RW, standing balance, toileting, and safety awareness to increased independence with BADLs and prepare for discharge home with sister. Pt amb with RW to bathroom with supervision. Bathing/dressing per below. Pt requires verbal cues to navigate in unfamiliar surroundings. Information on TTB left in room for sister when she visits later in day. Pt using AE with verbal cues for use. Pt pleased with progress. Pt remained in recliner. All needs within reach. Seat alarm activated.   Therapy Documentation Precautions:  Precautions Precautions: Fall, Back Precaution Booklet Issued: No Precaution Comments: legally blind; able to ambulate to bathroom without brace. Brace off in bed. Required Braces or Orthoses: Spinal Brace Spinal Brace: Lumbar corset, Applied in sitting position Restrictions Weight Bearing Restrictions: No Pain: Pt reports pain in back is "ok" ADL: ADL Equipment Provided: Reacher, Sock aid, Long-handled sponge Eating: Set up Where Assessed-Eating: Wheelchair Grooming: Modified independent Where Assessed-Grooming: Sitting at sink Upper Body Bathing: Modified independent Where Assessed-Upper Body Bathing: Shower Lower Body Bathing: Modified independent Where Assessed-Lower Body Bathing: Shower Upper Body Dressing: Setup Where Assessed-Upper Body Dressing: Chair Lower Body Dressing: Minimal assistance Where Assessed-Lower Body Dressing: Chair Toileting: Modified independent Where Assessed-Toileting: Toilet, Psychiatrist Transfer: Distant  supervision Toilet Transfer Method: Proofreader: Bedside commode, Grab bars Tub/Shower Transfer: Unable to assess Tub/Shower Transfer Method: Unable to assess Film/video editor: Distant supervision Film/video editor Method: Designer, industrial/product: Emergency planning/management officer, Grab bars V  Therapy/Group: Individual Therapy  Rich Brave 11/13/2022, 12:28 PM

## 2022-11-13 NOTE — Progress Notes (Signed)
Patient ID: Brian Daniels, male   DOB: April 22, 1956, 67 y.o.   MRN: [id number]  SW faxed outpatient PT referral to [facility] (p:[phone]/f:[phone]). *SW spoke with [place] confirming referral received.   SW informed pt sister on above. She reports they have already called to schedule.   Cecile Sheerer, MSW, LCSWA Office: [phone] Cell: [phone] Fax: [phone]

## 2022-11-13 NOTE — Discharge Instructions (Addendum)
Inpatient Rehab Discharge Instructions  TOMI PADDOCK Discharge date and time: 11/14/2022   Activities/Precautions/ Functional Status: Activity: no lifting, driving, or strenuous exercise until cleared by MD Diet: diabetic diet Wound Care: keep wound clean and dry Functional status:  ___ No restrictions     ___ Walk up steps independently ___ 24/7 supervision/assistance   ___ Walk up steps with assistance _x__ Intermittent supervision/assistance  ___ Bathe/dress independently ___ Walk with walker     ___ Bathe/dress with assistance ___ Walk Independently    ___ Shower independently ___ Walk with assistance    _x__ Shower with assistance __x_ No alcohol     ___ Return to work/school ________  Special Instructions: No driving, alcohol consumption or tobacco use.   COMMUNITY REFERRALS UPON DISCHARGE:    Outpatient: PT             Agency: [facility]      Phone: [phone]             Appointment Date/Time: *Please expect follow-up within 7-10 business days to schedule your home visit. If you have not received follow-up, be sure to contact the site directly.*  Medical Equipment/Items Ordered: 3in1 bedside commode                                                 Agency/Supplier: [facility] [phone]    My questions have been answered and I understand these instructions. I will adhere to these goals and the provided educational materials after my discharge from the hospital.  Patient/Caregiver Signature _______________________________ Date __________  Clinician Signature _______________________________________ Date __________  Please bring this form and your medication list with you to all your follow-up doctor's appointments.

## 2022-11-14 ENCOUNTER — Other Ambulatory Visit (HOSPITAL_COMMUNITY): Payer: Self-pay

## 2022-11-14 MED ORDER — METHOCARBAMOL 500 MG PO TABS
500.0000 mg | ORAL_TABLET | Freq: Four times a day (QID) | ORAL | 0 refills | Status: DC | PRN
  Filled 2022-11-14: qty 20, 5d supply, fill #0

## 2022-11-14 MED ORDER — OXYCODONE HCL 5 MG PO TABS
5.0000 mg | ORAL_TABLET | Freq: Three times a day (TID) | ORAL | 0 refills | Status: DC | PRN
  Filled 2022-11-14: qty 15, 5d supply, fill #0

## 2022-11-14 MED ORDER — ACETAMINOPHEN 325 MG PO TABS: 325.0000 mg | ORAL_TABLET | ORAL | Status: AC | PRN

## 2022-11-14 MED ORDER — METFORMIN HCL ER 500 MG PO TB24: 500.0000 mg | ORAL_TABLET | Freq: Every day | ORAL | 1 refills | Status: AC

## 2022-11-14 MED ORDER — CEPHALEXIN 500 MG PO CAPS
500.0000 mg | ORAL_CAPSULE | Freq: Three times a day (TID) | ORAL | 0 refills | Status: AC
  Filled 2022-11-14: qty 8, 3d supply, fill #0

## 2022-11-14 NOTE — Progress Notes (Signed)
Patient ID: Brian Daniels, male   DOB: 02-Dec-1955, 67 y.o.   MRN: [id number]  Removed staples from back incision and left flank incision. Steri strips placed. Patient tolerated procedure well.

## 2022-11-14 NOTE — Progress Notes (Signed)
Inpatient Rehabilitation Care Coordinator Discharge Note   Patient Details  Name: Brian Daniels MRN: [id number] Date of Birth: May 03, 1956   Discharge location: D/c to home  Length of Stay: 9 days  Discharge activity level: Supervision  Home/community participation: Limited  Patient response WG:NFAOZH Literacy - How often do you need to have someone help you when you read instructions, pamphlets, or other written material from your doctor or pharmacy?: Never  Patient response YQ:MVHQIO Isolation - How often do you feel lonely or isolated from those around you?: Never  Services provided included: MD, RD, PT, OT, SLP, TR, Pharmacy, Neuropsych, SW, CM, RN  Financial Services:  Field seismologist Utilized: Private Insurance [employer]  Choices offered to/list presented to: patient/sister  Follow-up services arranged:  Outpatient, DME    Outpatient Servicies: [facility] for PT only DME : [facility] for 3in1 [facility]    Patient response to transportation need: Is the patient able to respond to transportation needs?: Yes In the past 12 months, has lack of transportation kept you from medical appointments or from getting medications?: No In the past 12 months, has lack of transportation kept you from meetings, work, or from getting things needed for daily living?: No   Patient/Family verbalized understanding of follow-up arrangements:  Yes  Individual responsible for coordination of the follow-up plan: contact pt sister Brian Daniels #[phone]  Confirmed correct DME delivered: Gretchen Short 11/14/2022    Comments (or additional information):  Summary of Stay    Date/Time Discharge Planning CSW  11/11/22 1103 Pt will d/c to home with his sister who has been providing 24/7 care. SW will confirm there are no barriers to discharge. AAC       Brian Daniels Brian Daniels

## 2022-11-14 NOTE — Progress Notes (Signed)
PROGRESS NOTE   Subjective/Complaints:  Pt reports doing well- asking if gets ot go home today.  ROS:   Pt denies SOB, abd pain, CP, N/V/C/D Except for HPI   Objective:   No results found. No results for input(s): "WBC", "HGB", "HCT", "PLT" in the last 72 hours.   No results for input(s): "NA", "K", "CL", "CO2", "GLUCOSE", "BUN", "CREATININE", "CALCIUM" in the last 72 hours.    Intake/Output Summary (Last 24 hours) at 11/14/2022 0806 Last data filed at 11/14/2022 0644 Gross per 24 hour  Intake 838 ml  Output 2075 ml  Net -1237 ml     Pressure Injury 11/04/22 Back Left;Medial Stage 2 -  Partial thickness loss of dermis presenting as a shallow open injury with a red, pink wound bed without slough. blister/ device related (Active)  11/04/22 1440  Location: Back  Location Orientation: Left;Medial  Staging: Stage 2 -  Partial thickness loss of dermis presenting as a shallow open injury with a red, pink wound bed without slough.  Wound Description (Comments): blister/ device related  Present on Admission: Yes     Pressure Injury 11/04/22 Lumbar Lateral;Right Stage 2 -  Partial thickness loss of dermis presenting as a shallow open injury with a red, pink wound bed without slough. blister/ device related (Active)  11/04/22 1340  Location: Lumbar  Location Orientation: Lateral;Right  Staging: Stage 2 -  Partial thickness loss of dermis presenting as a shallow open injury with a red, pink wound bed without slough.  Wound Description (Comments): blister/ device related  Present on Admission: Yes    Physical Exam: Vital Signs Blood pressure (!) 154/75, pulse 70, temperature 98 F (36.7 C), resp. rate 18, height 5\' 10"  (1.778 m), weight 80.3 kg, SpO2 100 %.            General: awake, alert, appropriate, NAD HENT: lenses whitish due to  glaucoma oropharynx moist CV: regular rate; no JVD Pulmonary: CTA B/L; no  W/R/R- good air movement GI: soft, NT, ND, (+)BS Psychiatric: appropriate Neurological: Ox3  Skin; -popped blister midback- and right next to back incision- honeycomb on back incision-- when peel back foam dressing, Incisions look great- can remove staplesskin peels some with it- so didn't remove-  GU_ condom cath in place- light amber urine in bag Musculoskeletal:     Cervical back: Neck supple. No tenderness.     Right lower leg: No edema.     Left lower leg: No edema.     Comments: Ue's 5/5 B/L RLE- 5-/5 except HF on R 4+/5 LLE- 5-5/5 except L HF 4+/5  Skin:    General: Skin is warm and dry.     Comments: PIV in dorsum of each hand without erythema Lumbar incision and L groin/abd incision with honeycomb dressing in place- look OK  Neurological:     Mental Status: He is alert and oriented to person, place, and time.     Comments: Intact to light touch in Ue's and torso L1 numbness and tingling L>R; toherwise intact to light touch in all 4 extremities   Assessment/Plan: 1. Functional deficits which require 3+ hours per day of interdisciplinary therapy in a comprehensive inpatient  rehab setting. Physiatrist is providing close team supervision and 24 hour management of active medical problems listed below. Physiatrist and rehab team continue to assess barriers to discharge/monitor patient progress toward functional and medical goals  Care Tool:  Bathing    Body parts bathed by patient: Right arm, Left arm, Chest, Abdomen, Front perineal area, Buttocks, Right upper leg, Left upper leg, Face, Right lower leg, Left lower leg   Body parts bathed by helper: Right lower leg, Left lower leg     Bathing assist Assist Level: Independent with assistive device     Upper Body Dressing/Undressing Upper body dressing   What is the patient wearing?: Pull over shirt, Orthosis Orthosis activity level: Performed by helper  Upper body assist Assist Level: Set up assist    Lower Body  Dressing/Undressing Lower body dressing      What is the patient wearing?: Pants, Underwear/pull up     Lower body assist Assist for lower body dressing: Independent with assitive device     Toileting Toileting    Toileting assist Assist for toileting: Independent with assistive device     Transfers Chair/bed transfer  Transfers assist     Chair/bed transfer assist level: Supervision/Verbal cueing     Locomotion Ambulation   Ambulation assist      Assist level: Supervision/Verbal cueing Assistive device: Walker-rolling Max distance: >200 ft   Walk 10 feet activity   Assist     Assist level: Supervision/Verbal cueing Assistive device: Walker-rolling   Walk 50 feet activity   Assist Walk 50 feet with 2 turns activity did not occur: Safety/medical concerns (2/2 endurance)  Assist level: Supervision/Verbal cueing Assistive device: Walker-rolling    Walk 150 feet activity   Assist Walk 150 feet activity did not occur: Safety/medical concerns (2/2 endurance)  Assist level: Supervision/Verbal cueing Assistive device: Walker-rolling    Walk 10 feet on uneven surface  activity   Assist Walk 10 feet on uneven surfaces activity did not occur: Safety/medical concerns (2/2 endurance)   Assist level: Supervision/Verbal cueing Assistive device: Walker-rolling   Wheelchair     Assist Is the patient using a wheelchair?: No   Wheelchair activity did not occur: N/A         Wheelchair 50 feet with 2 turns activity    Assist    Wheelchair 50 feet with 2 turns activity did not occur: N/A       Wheelchair 150 feet activity     Assist  Wheelchair 150 feet activity did not occur: N/A       Blood pressure (!) 154/75, pulse 70, temperature 98 F (36.7 C), resp. rate 18, height 5\' 10"  (1.778 m), weight 80.3 kg, SpO2 100 %.  Medical Problem List and Plan: 1. Functional deficits secondary to lumbar myelopathy             -patient  may  shower if cover incisions             -ELOS/Goals: 7-10 mod I   Due to get Staples out next Saturday 5/4  Get staples out today D/c today  Remind pt to see Urology for mild urinary retention and urodynamics- needs to f/u with me.  2.  Antithrombotics: -DVT/anticoagulation:  Mechanical:  Antiembolism stockings, knee (TED hose) Bilateral lower extremities  4/27- is 7 days, but waiting to hear back from [facility]  4/28- got approval from [facility]- started lovenox for now             -antiplatelet therapy: none             -  follow up BLE venous duplex   3. Pain Management: Tylenol, baclofen, Robaxin, oxycodone as needed   4/29- 4/30pain controlled when takes pain meds- usually 4-5/10 without meds- less when takes meds  5/1- will go home with meds- and will then wean them 4. Mood/Behavior/Sleep: LCSW to evaluate and provide emotional support             -antipsychotic agents: n/a   5. Neuropsych/cognition: This patient is capable of making decisions on his own behalf.   6. Skin/Wound Care: Routine skin care checks  4/27- Has 2 stage II's from blisters near both incisions- foam and monitoring closely-  7. 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   11: DM-2: CBGs four times daily; carb modified diet; A!c 6.2% on 4/09             -continue metformin 500 mg with supper             -continue SSI   4/25- BG's controlled- con't regimen  4/27- so controlled- will drop back to BID-AC  4/28- Will stop CBG's since doing so well 12: Glaucoma: continue gtts X 4  4/24- got contacted to restart drops- did     13: History of gout: no reports of flare   14: Urinary hesitancy/frequency- will check PVRs/bladder scans   4/25- Will use condom cath since he cannot use urinal due to blindness  4/30- will check U/A and Cx- to make sure no UTI- having a lot of frequency and dribbling in between voiding.   5/2- might now have urinary retention-   5/3- Urinary retention is mild- likely needs to see Urology (has one) after d/c for urodynamics-  15: Constipation: continue regimen; PRNs ordered 4/28- LBM yesterday-will stop Colace- make Senna 1 tab daily prn and ocn't Miralax  4/29- having regular BM's- pt doesn't want to take bowel meds- explained will need some due to pain meds 4.30- LBM yesterday-  5/2- LB< yesterday- small-   16. Blisters/stage II back/L flank  4/26- pt's back blister popped this AM; other one intact- will con't foam dressing and dry dressing on back- honeycomb came off- sutures intact- looks good  4/27- to get Staples out 5/4  4/30- con't foam dressing- will d/c honeycomb dressing today  5/3- will d/c staples today and place steristrips. Has been 14 days- both locations.   17. Hypokalemia  4/29- K+ 3.4- will replete 40 mEq x1 18. -  UTI- started Keflex 500 mg q8 hours- ?100k e coli- pending sensitivities Still going frequently- esp when stands up- condom cath stopped so can learn how to use urinal while here.   5/2- having possible urinary retention- peed this AM, but felt like still full so went again- couldn't void-ordered bladder scans q6 hours and cath if volumes >350cc- might need Flmoax? Due to UTI likely.    5/3- PVR was 161cc- so doesn't need to be in/out cathed- pt said vision gets worse with Flomax/Tamsulosin so doesn't  want to start/restart it- Willing to go home retaining some- is <200cc. UTI pan-sensitive- so on correct ABX.   I spent a total of 33   minutes on total care today- >50% coordination of care- due to  D/w nursing about urine Cx and ABX- explained to pt is on  right ABX- urinary issues should get better as UTI treated-   LOS: 10 days A FACE TO FACE EVALUATION WAS PERFORMED  Graciela Plato 11/14/2022, 8:06 AM

## 2022-11-14 NOTE — Progress Notes (Signed)
Inpatient Rehabilitation Discharge Medication Review by a Pharmacist  A complete drug regimen review was completed for this patient to identify any potential clinically significant medication issues.  High Risk Drug Classes Is patient taking? Indication by Medication  Antipsychotic No   Anticoagulant No   Antibiotic Yes Keflex - UTI  Opioid Yes Oxycodone - moderate pain  Antiplatelet Yes Asa- cva ppx  Hypoglycemics/insulin Yes Metformin - DM  Vasoactive Medication Yes Norvasc, lotensin, toprol xl- HTN  Chemotherapy No   Other Yes Lipitor- HLD Lidocaine patch - pain Robaxin - prn muscle spasms     Type of Medication Issue Identified Description of Issue Recommendation(s)  Drug Interaction(s) (clinically significant)     Duplicate Therapy     Allergy     No Medication Administration End Date     Incorrect Dose     Additional Drug Therapy Needed     Significant med changes from prior encounter (inform family/care partners about these prior to discharge).    Other       Clinically significant medication issues were identified that warrant physician communication and completion of prescribed/recommended actions by midnight of the next day:  No   Time spent performing this drug regimen review (minutes):  30  Rexford Maus, PharmD, BCPS 11/14/2022 9:55 AM

## 2022-11-16 LAB — URINE CULTURE: Culture: 80000 — AB

## 2022-11-20 ENCOUNTER — Other Ambulatory Visit: Payer: Self-pay

## 2022-11-20 MED ORDER — OXYCODONE HCL 5 MG PO TABS: 5.0000 mg | ORAL_TABLET | Freq: Three times a day (TID) | ORAL | 0 refills | Status: AC | PRN

## 2022-11-20 NOTE — Telephone Encounter (Signed)
Brian Daniels has an Hospital follow up appointment on 01/07/2023. He will be out of his 7 day supply of  Oxycodone 5 MG tomorrow. If granted, please send a refill to [facility] on [facility].  PMP:   Filled  Written  ID  Drug  QTY  Days  Prescriber  RX #  Dispenser  Refill  Daily Dose*  Pymt Type  PMP  11/14/2022 11/14/2022 1  Oxycodone Hcl (Ir) 5 Mg Tablet 15.00 5 Sa Set [id number] Mos ([id number]) 0/0 22.50 MME Other [place].  Thank you

## 2023-01-07 ENCOUNTER — Encounter: Payer: Self-pay | Admitting: Physical Medicine and Rehabilitation

## 2023-01-07 ENCOUNTER — Encounter
Payer: Medicare Other | Attending: Physical Medicine and Rehabilitation | Admitting: Physical Medicine and Rehabilitation

## 2023-01-07 VITALS — BP 134/80 | HR 63 | Ht 70.0 in | Wt 164.0 lb

## 2023-01-07 DIAGNOSIS — H540X55 Blindness right eye category 5, blindness left eye category 5: Secondary | ICD-10-CM | POA: Diagnosis present

## 2023-01-07 DIAGNOSIS — M48061 Spinal stenosis, lumbar region without neurogenic claudication: Secondary | ICD-10-CM | POA: Diagnosis present

## 2023-01-07 DIAGNOSIS — G959 Disease of spinal cord, unspecified: Secondary | ICD-10-CM | POA: Insufficient documentation

## 2023-01-07 DIAGNOSIS — R202 Paresthesia of skin: Secondary | ICD-10-CM | POA: Insufficient documentation

## 2023-01-07 MED ORDER — METHOCARBAMOL 500 MG PO TABS: 500.0000 mg | ORAL_TABLET | Freq: Three times a day (TID) | ORAL | 1 refills | Status: AC | PRN

## 2023-01-07 NOTE — Patient Instructions (Signed)
Patient is a 67 yr old male with lumbar myelopathy s/p fusion in April 2024; also has urinary retention; HTN; legally blind (from glaucoma), DM Here for hospital f/u for lumbar myelopathy   I suggest elevation for 30 minutes to level of heart-  2-3x/day- try to use recliner to o so, or lay down on sofa.   2. When legs dangling, can get swelling- if doesn't improve  with that, then can get compression socks to the knees- can get from [employer] or [employer]- usually 6 pairs for 20-25$.   3. Walk around home- 5x-  2-3x/day-  Unless can go outside.    4. Remind them to  Let me know if therapy needs to be extended- for PT-   5. Con't Robaxin/Methocarbamol 500 mg q8 hours as needed for spasms- sent with 1 refill.    6. F/U  3 months-

## 2023-01-07 NOTE — Progress Notes (Signed)
Subjective:    Patient ID: Brian Daniels, male    DOB: 1955-11-10, 67 y.o.   MRN: [id number]  HPI Patient is a 67 yr old male with lumbar myelopathy s/p fusion in April 2024; also has urinary retention; HTN; legally blind (from glaucoma), DM Here for hospital f/u for lumbar myelopathy   Things "MUCH better".  Pain still has some tenderness- in his L side from where surgical incision was made. Taking Tylenol for it- ~ 1x/week usually when needs to use tylenol.   Urinary retention- saw Urology- scheduled for Biopsy in July of Prostate- due to enlarged Prostate.   Walking with RW-  can walk around the house- thinks could do a grocery store, but hasn't been yet- to go to [employer].  To use Rolator so can sit down. Has it at the house.   Blisters went away-  thinks might have come from brace.  Not wearing brace anymore, per pt/sister.   Feet are swelling- mostly the R foot, but L feels heavier.   Per pt, PT told him that pulse was "lower in left foot".  Has called PCP about it- but hasn't heard back.  Comes and goes When elevates legs, it gets better.  When legs hanging down, gets worse.   Does outpt PT- [facility]- 2x/week- 8 weeks so far.   Sometimes, legs, at night- legs will jump.  Uses Robaxin- when it occurs.  Occurred a little bit. Could have been positioning.   Stopped taking Baclofen when ran out.     Pain Inventory Average Pain 5 Pain Right Now 4 My pain is intermittent, sharp, and tingling  In the last 24 hours, has pain interfered with the following? General activity 0 Relation with others 0 Enjoyment of life 4 What TIME of day is your pain at its worst? night Sleep (in general) Good  Pain is worse with: walking, standing, and some activites Pain improves with: therapy/exercise and medication Relief from Meds: 10  walk with assistance ability to climb steps?  yes do you drive?  no Do you have any goals in this area?  yes  retired I need  assistance with the following:  meal prep, household duties, and shopping Do you have any goals in this area?  yes  weakness tremor trouble walking spasms  Any changes since last visit?  yes x-rays of back  Any changes since last visit?  no    Family History  Problem Relation Age of Onset   Hyperlipidemia Mother    Diabetes Mother    Hypertension Father    Social History   Socioeconomic History   Marital status: Single    Spouse name: Not on file   Number of children: 0   Years of education: 12   Highest education level: High school graduate  Occupational History   Occupation: Retired  Tobacco Use   Smoking status: Never   Smokeless tobacco: Never  Building services engineer Use: Never used  Substance and Sexual Activity   Alcohol use: Not Currently   Drug use: Never   Sexual activity: Not on file  Other Topics Concern   Not on file  Social History Narrative   Lives with his sister.   Left-handed.   No daily caffeine use.  Occasional tea.   Social Determinants of Health   Financial Resource Strain: Not on file  Food Insecurity: Not on file  Transportation Needs: Not on file  Physical Activity: Not on file  Stress:  Not on file  Social Connections: Not on file   Past Surgical History:  Procedure Laterality Date   ANTERIOR CERVICAL DECOMPRESSION/DISCECTOMY FUSION 4 LEVELS N/A 08/05/2019   Procedure: ANTERIOR CERVICAL DECOMPRESSION/DISCECTOMY FUSION CERVICAL THREE- CERVICAL FOUR, CERVICAL FOUR- CERVICAL FIVE, CERVICAL FIVE- CERVICAL SIX, CERVICAL SIX- CERVICAL SEVEN;  Surgeon: Lisbeth Renshaw, MD;  Location: [facility] OR;  Service: Neurosurgery;  Laterality: N/A;  anterior   ANTERIOR LAT LUMBAR FUSION N/A 10/31/2022   Procedure: PRONE TRANS-PSOAS LATERAL INTERBODY FUSION, LUMBAR TWO-THREE, LUMBAR THREE-FOUR, LUMBAR TWO-FOUR LAMINECTOMY WITH INSTRUMENTATION;  Surgeon: Lisbeth Renshaw, MD;  Location: [facility] OR;  Service: Neurosurgery;  Laterality: N/A;   COLONOSCOPY     EYE  SURGERY Bilateral    multiple surgeries bilateral -    MULTIPLE TOOTH EXTRACTIONS     WISDOM TOOTH EXTRACTION     Past Medical History:  Diagnosis Date   Cataracts, bilateral    removed by surgery   Diabetes mellitus without complication (HCC)    type 2   Glaucoma    bilateral   Gout    Heart murmur    several years ago   Hypercholesteremia    Hypertension    Left atrial enlargement    Legally blind    bilateral   Neuropathy    hands, arm   Numbness    hands, arms   Wears partial dentures    lower   Wheelchair bound    since 05/30/19   BP 134/80   Pulse 63   Ht 5\' 10"  (1.778 m)   Wt 164 lb (74.4 kg)   SpO2 99%   BMI 23.53 kg/m   Opioid Risk Score:   Fall Risk Score:  `1  Depression screen [facility] 2/9     01/07/2023    9:01 AM 02/03/2020   10:07 AM  Depression screen PHQ 2/9  Decreased Interest 1 0  Down, Depressed, Hopeless 0 0  PHQ - 2 Score 1 0  Altered sleeping 0   Tired, decreased energy 1   Change in appetite 0   Feeling bad or failure about yourself  0   Trouble concentrating 0   Moving slowly or fidgety/restless 0   Suicidal thoughts 0   PHQ-9 Score 2     Review of Systems  Musculoskeletal:  Positive for gait problem.       Left hip pain, pain outside of the left calf & Left forearm pain  Neurological:  Positive for weakness and numbness.       Spasms  All other systems reviewed and are negative.      Objective:   Physical Exam \\awake , alert, appropriate, wearing sunglasses; blind; NAD Using RW to walk- accompanied by sister Trace swelling in legs- B/L- in feet- but not in ankles- wearing tennis shoes.   MS:  RLE- HF 5-/5; KE/KF 5-/5; DF 4+/5 and PF 5-/5 LLE- HF 4+/5; KE/KF 5-/5 and DF 5-/5 and PF 5-/5  Neuro:  LLE- a little more numb from knee downwards- through L3- S1 RLE- intact "pretty normal" per pt.  No increased tone; no clonus in LE's.  B?L    Assessment & Plan:   Patient is a 67 yr old male with lumbar myelopathy s/p  fusion in April 2024; also has urinary retention; HTN; legally blind (from glaucoma), DM Here for hospital f/u for lumbar myelopathy   I suggest elevation for 30 minutes to level of heart-  2-3x/day- try to use recliner to o so, or lay down on sofa.  2. When legs dangling, can get swelling- if doesn't improve  with that, then can get compression socks to the knees- can get from [employer] or [employer]- usually 6 pairs for 20-25$.   3. Walk around home- 5x-  2-3x/day-  Unless can go outside.    4. Remind them to  Let me know if therapy needs to be extended- for PT-   5. Con't Robaxin/Methocarbamol 500 mg q8 hours as needed for spasms- sent with 1 refill.    6. F/U  3 months-    I spent a total of 31   minutes on total care today- >50% coordination of care- due to discussion about spasms, not spasticity; also therapies and LE edema- took longer to do exam and interview due to pt pace.

## 2023-04-10 ENCOUNTER — Encounter: Payer: Medicare Other | Admitting: Physical Medicine and Rehabilitation

## 2023-06-15 ENCOUNTER — Encounter: Payer: Self-pay | Admitting: Physical Medicine and Rehabilitation

## 2023-06-15 ENCOUNTER — Encounter
Payer: Medicare Other | Attending: Physical Medicine and Rehabilitation | Admitting: Physical Medicine and Rehabilitation

## 2023-06-15 VITALS — BP 174/96 | HR 61 | Ht 70.0 in | Wt 174.0 lb

## 2023-06-15 DIAGNOSIS — R531 Weakness: Secondary | ICD-10-CM | POA: Insufficient documentation

## 2023-06-15 DIAGNOSIS — R269 Unspecified abnormalities of gait and mobility: Secondary | ICD-10-CM | POA: Diagnosis not present

## 2023-06-15 DIAGNOSIS — G959 Disease of spinal cord, unspecified: Secondary | ICD-10-CM | POA: Insufficient documentation

## 2023-06-15 NOTE — Progress Notes (Signed)
Subjective:    Patient ID: Brian Daniels, male    DOB: 02-Jul-1956, 67 y.o.   MRN: [id number]  HPI  Patient is a 67 yr old male with lumbar myelopathy s/p fusion in April 2024; also has urinary retention; HTN; legally blind (from glaucoma), DM Here for  f/u for lumbar myelopathy    Spasms a lot better- every now and then- a few times in a month.  Hasn't really needed Robaxin- since spasms are there for such a short period of time.   Still doing therapy- go to outpt PT- 1x/week- hadn't said when supposed to finish.   Is doing HEP- every day- doesn't something every day.   Using pedal bike- but not effective for him- because no real resistance. Has used, but not really working for him.    Not so much the pain; more the numb feeling in L thigh.   R foot goes way out to the right- per sister.     Pain Inventory Average Pain 3 Pain Right Now 1 My pain is dull  In the last 24 hours, has pain interfered with the following? General activity 0 Relation with others 0 Enjoyment of life 1 What TIME of day is your pain at its worst? varies Sleep (in general) Good  Pain is worse with: standing Pain improves with: rest and therapy/exercise Relief from Meds:  .  Family History  Problem Relation Age of Onset   Hyperlipidemia Mother    Diabetes Mother    Hypertension Father    Social History   Socioeconomic History   Marital status: Single    Spouse name: Not on file   Number of children: 0   Years of education: 12   Highest education level: High school graduate  Occupational History   Occupation: Retired  Tobacco Use   Smoking status: Never   Smokeless tobacco: Never  Vaping Use   Vaping status: Never Used  Substance and Sexual Activity   Alcohol use: Not Currently   Drug use: Never   Sexual activity: Not on file  Other Topics Concern   Not on file  Social History Narrative   Lives with his sister.   Left-handed.   No daily caffeine use.  Occasional tea.    Social Determinants of Health   Financial Resource Strain: Not on file  Food Insecurity: Not on file  Transportation Needs: Not on file  Physical Activity: Not on file  Stress: Not on file  Social Connections: Not on file   Past Surgical History:  Procedure Laterality Date   ANTERIOR CERVICAL DECOMPRESSION/DISCECTOMY FUSION 4 LEVELS N/A 08/05/2019   Procedure: ANTERIOR CERVICAL DECOMPRESSION/DISCECTOMY FUSION CERVICAL THREE- CERVICAL FOUR, CERVICAL FOUR- CERVICAL FIVE, CERVICAL FIVE- CERVICAL SIX, CERVICAL SIX- CERVICAL SEVEN;  Surgeon: Lisbeth Renshaw, MD;  Location: [facility] OR;  Service: Neurosurgery;  Laterality: N/A;  anterior   ANTERIOR LAT LUMBAR FUSION N/A 10/31/2022   Procedure: PRONE TRANS-PSOAS LATERAL INTERBODY FUSION, LUMBAR TWO-THREE, LUMBAR THREE-FOUR, LUMBAR TWO-FOUR LAMINECTOMY WITH INSTRUMENTATION;  Surgeon: Lisbeth Renshaw, MD;  Location: [facility] OR;  Service: Neurosurgery;  Laterality: N/A;   COLONOSCOPY     EYE SURGERY Bilateral    multiple surgeries bilateral -    MULTIPLE TOOTH EXTRACTIONS     WISDOM TOOTH EXTRACTION     Past Surgical History:  Procedure Laterality Date   ANTERIOR CERVICAL DECOMPRESSION/DISCECTOMY FUSION 4 LEVELS N/A 08/05/2019   Procedure: ANTERIOR CERVICAL DECOMPRESSION/DISCECTOMY FUSION CERVICAL THREE- CERVICAL FOUR, CERVICAL FOUR- CERVICAL FIVE, CERVICAL FIVE- CERVICAL SIX, CERVICAL  SIX- CERVICAL SEVEN;  Surgeon: Lisbeth Renshaw, MD;  Location: [facility] OR;  Service: Neurosurgery;  Laterality: N/A;  anterior   ANTERIOR LAT LUMBAR FUSION N/A 10/31/2022   Procedure: PRONE TRANS-PSOAS LATERAL INTERBODY FUSION, LUMBAR TWO-THREE, LUMBAR THREE-FOUR, LUMBAR TWO-FOUR LAMINECTOMY WITH INSTRUMENTATION;  Surgeon: Lisbeth Renshaw, MD;  Location: [facility] OR;  Service: Neurosurgery;  Laterality: N/A;   COLONOSCOPY     EYE SURGERY Bilateral    multiple surgeries bilateral -    MULTIPLE TOOTH EXTRACTIONS     WISDOM TOOTH EXTRACTION     Past Medical History:   Diagnosis Date   Cataracts, bilateral    removed by surgery   Diabetes mellitus without complication (HCC)    type 2   Glaucoma    bilateral   Gout    Heart murmur    several years ago   Hypercholesteremia    Hypertension    Left atrial enlargement    Legally blind    bilateral   Neuropathy    hands, arm   Numbness    hands, arms   Wears partial dentures    lower   Wheelchair bound    since 05/30/19   BP (!) 174/96   Pulse 61   Ht 5\' 10"  (1.778 m)   Wt 174 lb (78.9 kg)   SpO2 94%   BMI 24.97 kg/m   Opioid Risk Score:   Fall Risk Score:  `1  Depression screen [facility] 2/9     01/07/2023    9:01 AM 02/03/2020   10:07 AM  Depression screen PHQ 2/9  Decreased Interest 1 0  Down, Depressed, Hopeless 0 0  PHQ - 2 Score 1 0  Altered sleeping 0   Tired, decreased energy 1   Change in appetite 0   Feeling bad or failure about yourself  0   Trouble concentrating 0   Moving slowly or fidgety/restless 0   Suicidal thoughts 0   PHQ-9 Score 2       Review of Systems  All other systems reviewed and are negative.     Objective:   Physical Exam  Awake, alert, appropriate, wearing sunglasses; accompanied by sister; uses cane to walk, NAD MS: RLE- HF 5-/5; KE 5-/5 DF 4+/5 and PF 4+/5 LLE- HF 5-/5; KE  4+/5; DF and PF 5-/5  Neuro: No increased tone No clonus b/l  in LE's  LLE-Decreased to light touch on L thigh- L1/2 and improved, but not resolved L3/L4- and then L5/S1 very decreased to light touch.  RLE- intact to L5/S1 where it decreased to light touch at L5.     Extremities- no LE edema   Gait- rotated R foot out to compensate for R toe/DF weakness-  Assessment & Plan:   Patient is a 67 yr old male with lumbar myelopathy s/p fusion in April 2024; also has urinary retention; HTN; legally blind (from glaucoma), DM Here for  f/u for lumbar myelopathy    Resistance pedal bike- at least 5 different resistance options-   2. Con't PT for now; When done with  therapy, will join gym to go- has Silver [employer]neakers, type membership so can go to the gym.    3. Not taking any pain meds anymore- tylenol as needed   4. Off baclofen and not taking Robaxin?Methocarbamol much-  not taking at all-   5.  Walk around house a few times- multiple times/day- continue with this.    6. No swelling today- but continue to elevate IF gets swelling. Esp  if feet dangling to long.  Elevate to level of heart for 30 minutes when needed.    7.  F/U in as needed- call me if needs a follow up.     I spent a total of  24  minutes on total care today- >50% coordination of care- due to d/w pt about swelling, pain; gait changes and Home exercise program

## 2023-06-15 NOTE — Patient Instructions (Signed)
Patient is a 67 yr old male with lumbar myelopathy s/p fusion in April 2024; also has urinary retention; HTN; legally blind (from glaucoma), DM Here for  f/u for lumbar myelopathy    Resistance pedal bike- at least 5 different resistance options-   2. Con't PT for now; When done with therapy, will join gym to go- has Silver Sneakers, type membership so can go to the gym.    3. Not taking any pain meds anymore- tylenol as needed   4. Off baclofen and not taking Robaxin?Methocarbamol much-  not taking at all-   5.  Walk around house a few times- multiple times/day- continue with this.    6. No swelling today- but continue to elevate IF gets swelling. Esp if feet dangling to long.  Elevate to level of heart for 30 minutes when needed.    7.  F/U in as needed- call me if needs a follow up.

## 2023-09-14 ENCOUNTER — Other Ambulatory Visit (HOSPITAL_COMMUNITY): Payer: Self-pay

## 2024-01-21 ENCOUNTER — Other Ambulatory Visit: Payer: Self-pay | Admitting: Urology

## 2024-01-29 ENCOUNTER — Other Ambulatory Visit: Payer: Self-pay | Admitting: Urology

## 2024-02-19 NOTE — Patient Instructions (Signed)
 SURGICAL WAITING ROOM VISITATION  Patients having surgery or a procedure may have no more than 2 support people in the waiting area - these visitors may rotate.    Children under the age of 28 must have an adult with them who is not the patient.  Visitors with respiratory illnesses are discouraged from visiting and should remain at home.  If the patient needs to stay at the hospital during part of their recovery, the visitor guidelines for inpatient rooms apply. Pre-op nurse will coordinate an appropriate time for 1 support person to accompany patient in pre-op.  This support person may not rotate.    Please refer to the [facility] website for the visitor guidelines for Inpatients (after your surgery is over and you are in a regular room).       Your procedure is scheduled on:  03/03/2024    Report to [facility] Main Entrance    Report to admitting at  0615 AM   Call this number if you have problems the morning of surgery [phone]   Do not eat food  or drink liquids :After Midnight.                   If you have questions, please contact your surgeon's office.      Oral Hygiene is also important to reduce your risk of infection.                                    Remember - BRUSH YOUR TEETH THE MORNING OF SURGERY WITH YOUR REGULAR TOOTHPASTE  DENTURES WILL BE REMOVED PRIOR TO SURGERY PLEASE DO NOT APPLY Poly grip OR ADHESIVES!!!   Do NOT smoke after Midnight   Stop all vitamins and herbal supplements 7 days before surgery.   Take these medicines the morning of surgery with A SIP OF WATER:  amlodipine , eye drops as usual toprol               Metformin - none am of surgery   DO NOT TAKE ANY ORAL DIABETIC MEDICATIONS DAY OF YOUR SURGERY  Bring CPAP mask and tubing day of surgery.                              You may not have any metal on your body including hair pins, jewelry, and body piercing             Do not wear make-up, lotions, powders,  perfumes/cologne, or deodorant  Do not wear nail polish including gel and S&S, artificial/acrylic nails, or any other type of covering on natural nails including finger and toenails. If you have artificial nails, gel coating, etc. that needs to be removed by a nail salon please have this removed prior to surgery or surgery may need to be canceled/ delayed if the surgeon/ anesthesia feels like they are unable to be safely monitored.   Do not shave  48 hours prior to surgery.               Men may shave face and neck.   Do not bring valuables to the hospital. [facility]  IS NOT             RESPONSIBLE   FOR VALUABLES.   Contacts, glasses, dentures or bridgework may not be worn into surgery.   Bring small overnight bag day of surgery.  DO NOT BRING YOUR HOME MEDICATIONS TO THE HOSPITAL. PHARMACY WILL DISPENSE MEDICATIONS LISTED ON YOUR MEDICATION LIST TO YOU DURING YOUR ADMISSION IN THE HOSPITAL!    Patients discharged on the day of surgery will not be allowed to drive home.  Someone NEEDS to stay with you for the first 24 hours after anesthesia.   Special Instructions: Bring a copy of your healthcare power of attorney and living will documents the day of surgery if you haven't scanned them before.              Please read over the following fact sheets you were given: IF YOU HAVE QUESTIONS ABOUT YOUR PRE-OP INSTRUCTIONS PLEASE CALL [phone].   If you received a COVID test during your pre-op visit  it is requested that you wear a mask when out in public, stay away from anyone that may not be feeling well and notify your surgeon if you develop symptoms. If you test positive for Covid or have been in contact with anyone that has tested positive in the last 10 days please notify you surgeon.    [place] - Preparing for Surgery Before surgery, you can play an important role.  Because skin is not sterile, your skin needs to be as free of germs as possible.  You can reduce the number of germs  on your skin by washing with CHG (chlorahexidine gluconate) soap before surgery.  CHG is an antiseptic cleaner which kills germs and bonds with the skin to continue killing germs even after washing. Please DO NOT use if you have an allergy to CHG or antibacterial soaps.  If your skin becomes reddened/irritated stop using the CHG and inform your nurse when you arrive at Short Stay. Do not shave (including legs and underarms) for at least 48 hours prior to the first CHG shower.  You may shave your face/neck. Please follow these instructions carefully:  1.  Shower with CHG Soap the night before surgery and the  morning of Surgery.  2.  If you choose to wash your hair, wash your hair first as usual with your  normal  shampoo.  3.  After you shampoo, rinse your hair and body thoroughly to remove the  shampoo.                           4.  Use CHG as you would any other liquid soap.  You can apply chg directly  to the skin and wash                       Gently with a scrungie or clean washcloth.  5.  Apply the CHG Soap to your body ONLY FROM THE NECK DOWN.   Do not use on face/ open                           Wound or open sores. Avoid contact with eyes, ears mouth and genitals (private parts).                       Wash face,  Genitals (private parts) with your normal soap.             6.  Wash thoroughly, paying special attention to the area where your surgery  will be performed.  7.  Thoroughly rinse your body with warm water from the neck down.  8.  DO NOT shower/wash with your normal soap after using and rinsing off  the CHG Soap.                9.  Pat yourself dry with a clean towel.            10.  Wear clean pajamas.            11.  Place clean sheets on your bed the night of your first shower and do not  sleep with pets. Day of Surgery : Do not apply any lotions/deodorants the morning of surgery.  Please wear clean clothes to the hospital/surgery center.  FAILURE TO FOLLOW THESE INSTRUCTIONS  MAY RESULT IN THE CANCELLATION OF YOUR SURGERY PATIENT SIGNATURE_________________________________  NURSE SIGNATURE__________________________________  ________________________________________________________________________

## 2024-02-23 ENCOUNTER — Encounter (HOSPITAL_COMMUNITY): Payer: Self-pay

## 2024-02-23 ENCOUNTER — Encounter (HOSPITAL_COMMUNITY)
Admission: RE | Admit: 2024-02-23 | Discharge: 2024-02-23 | Disposition: A | Discharge status: Home / Self Care | Source: Ambulatory Visit | Attending: Urology | Admitting: Urology

## 2024-02-23 ENCOUNTER — Other Ambulatory Visit: Payer: Self-pay

## 2024-02-23 VITALS — BP 144/77 | HR 57 | Temp 98.3°F | Resp 16 | Ht 70.0 in | Wt 174.0 lb

## 2024-02-23 DIAGNOSIS — N400 Enlarged prostate without lower urinary tract symptoms: Secondary | ICD-10-CM | POA: Diagnosis not present

## 2024-02-23 DIAGNOSIS — H548 Legal blindness, as defined in USA: Secondary | ICD-10-CM | POA: Diagnosis not present

## 2024-02-23 DIAGNOSIS — Z01818 Encounter for other preprocedural examination: Secondary | ICD-10-CM | POA: Diagnosis present

## 2024-02-23 DIAGNOSIS — R001 Bradycardia, unspecified: Secondary | ICD-10-CM | POA: Insufficient documentation

## 2024-02-23 DIAGNOSIS — Z7984 Long term (current) use of oral hypoglycemic drugs: Secondary | ICD-10-CM | POA: Insufficient documentation

## 2024-02-23 DIAGNOSIS — Z981 Arthrodesis status: Secondary | ICD-10-CM | POA: Insufficient documentation

## 2024-02-23 DIAGNOSIS — E119 Type 2 diabetes mellitus without complications: Secondary | ICD-10-CM | POA: Diagnosis not present

## 2024-02-23 DIAGNOSIS — I10 Essential (primary) hypertension: Secondary | ICD-10-CM | POA: Insufficient documentation

## 2024-02-23 HISTORY — DX: Peripheral vascular disease, unspecified: I73.9

## 2024-02-23 HISTORY — DX: Unspecified osteoarthritis, unspecified site: M19.90

## 2024-02-23 LAB — CBC
HCT: 43.4 % (ref 39.0–52.0)
Hemoglobin: 13.7 g/dL (ref 13.0–17.0)
MCH: 26.5 pg (ref 26.0–34.0)
MCHC: 31.6 g/dL (ref 30.0–36.0)
MCV: 83.9 fL (ref 80.0–100.0)
Platelets: 165 K/uL (ref 150–400)
RBC: 5.17 MIL/uL (ref 4.22–5.81)
RDW: 14.1 % (ref 11.5–15.5)
WBC: 5.5 K/uL (ref 4.0–10.5)
nRBC: 0 % (ref 0.0–0.2)

## 2024-02-23 LAB — GLUCOSE, CAPILLARY: Glucose-Capillary: 87 mg/dL (ref 70–99)

## 2024-02-23 LAB — BASIC METABOLIC PANEL WITH GFR
Anion gap: 9 (ref 5–15)
BUN: 18 mg/dL (ref 8–23)
CO2: 28 mmol/L (ref 22–32)
Calcium: 9.3 mg/dL (ref 8.9–10.3)
Chloride: 104 mmol/L (ref 98–111)
Creatinine, Ser: 0.9 mg/dL (ref 0.61–1.24)
GFR, Estimated: >60 mL/min (ref >60)
Glucose, Bld: 98 mg/dL (ref 70–99)
Potassium: 3.5 mmol/L (ref 3.5–5.1)
Sodium: 141 mmol/L (ref 135–145)

## 2024-02-23 NOTE — Progress Notes (Addendum)
 Anesthesia Review:  Brian Daniels  Cardiologist : none   PPM/ ICD: Device Orders: Rep Notified:  Chest x-ray : EKG : 02/23/24  Echo : Stress test: Cardiac Cath :   Activity level: can do a flight of stairs without difficulty  Sleep Study/ CPAP : none  Fasting Blood Sugar :      / Checks Blood Sugar -- times a day:     DM- type2- does nto check glucose at home  Hgba1c-02/23/24- 6.2 Metformin - none am of surgery   Blood Thinner/ Instructions /Last Dose: ASA / Instructions/ Last Dose :    81 mg aspirin     PT is Legally Blind Sister with pt at preop appt.  Uses cane.   Sister needs to go back with pt to Short Stay.    Glucose-87 at preop on 02/12/24.  PT stated when he leaves preop he is going to eat.

## 2024-02-24 LAB — HEMOGLOBIN A1C
Hgb A1c MFr Bld: 6.2 % — ABNORMAL HIGH (ref 4.8–5.6)
Mean Plasma Glucose: 131 mg/dL

## 2024-02-24 NOTE — Progress Notes (Signed)
 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 Pt is legally blind.  Pt with hx of heart murmur. No murmur heard on exam by provider on 10/21/22. Prior echo in [facility]24 done at [facility] medical was normal.  VS: BP (!) 144/77   Pulse (!) 57   Temp 36.8 C (Oral)   Resp 16   Ht 5' 10 (1.778 m)   Wt 78.9 kg   SpO2 100%   BMI 24.97 kg/m   PROVIDERS: Joshua Francisco, MD   LABS: Labs reviewed: Acceptable for surgery. (all labs ordered are listed, but only abnormal results are displayed)  Labs Reviewed  HEMOGLOBIN A1C - Abnormal; Notable for the following components:      Result Value   Hgb A1c MFr Bld 6.2 (*)    All other components within normal limits  BASIC METABOLIC PANEL WITH GFR  CBC  GLUCOSE, CAPILLARY     IMAGES:   EKG 02/23/24:  Sinus bradycardia, rate 59 Otherwise normal ECG  CV:  Echo 09/02/22 [facility]):  Conclusions: 1.  Normal study: Normal cardiac chamber sizes and function; normal valve anatomy and function; no pericardial effusion or intracardiac mass.  No intracardiac shunts by 2D and color-flow imaging.  Normal thoracic aorta and aortic arch.  Past Medical History:  Diagnosis Date   Arthritis    Cataracts, bilateral    removed by surgery   Diabetes mellitus without complication (HCC)    type 2   Glaucoma    bilateral   Glaucoma    severe both eyes   Gout    Heart murmur    several years ago   Hypercholesteremia    Hypertension    Left atrial enlargement     Legally blind    bilateral   Neuropathy    hands, arm   Numbness    hands, arms   Peripheral vascular disease (HCC)    Wears partial dentures    lower   Wheelchair bound    since 05/30/19    Past Surgical History:  Procedure Laterality Date   ANTERIOR CERVICAL DECOMPRESSION/DISCECTOMY FUSION 4 LEVELS N/A 08/05/2019   Procedure: ANTERIOR CERVICAL DECOMPRESSION/DISCECTOMY FUSION CERVICAL THREE- CERVICAL FOUR, CERVICAL FOUR- CERVICAL FIVE, CERVICAL FIVE- CERVICAL SIX, CERVICAL SIX- CERVICAL SEVEN;  Surgeon: Lanis Pupa, MD;  Location: [facility] OR;  Service: Neurosurgery;  Laterality: N/A;  anterior   ANTERIOR LAT LUMBAR FUSION N/A 10/31/2022   Procedure: PRONE TRANS-PSOAS LATERAL INTERBODY FUSION, LUMBAR TWO-THREE, LUMBAR THREE-FOUR, LUMBAR TWO-FOUR LAMINECTOMY WITH INSTRUMENTATION;  Surgeon: Lanis Pupa, MD;  Location: [facility] OR;  Service: Neurosurgery;  Laterality: N/A;   COLONOSCOPY     EYE SURGERY Bilateral    multiple surgeries bilateral -    MULTIPLE TOOTH EXTRACTIONS     WISDOM TOOTH EXTRACTION      MEDICATIONS:  acetaminophen  (TYLENOL ) 325 MG tablet   amLODipine  (NORVASC ) 5 MG tablet   Ascorbic Acid (VITAMIN C) 1000 MG tablet   aspirin  EC 81 MG tablet   atorvastatin  (LIPITOR) 20 MG tablet  benazepril  (LOTENSIN ) 10 MG tablet   BL CALCIUM -MAGNESIUM -ZINC  PO   brimonidine  (ALPHAGAN ) 0.2 % ophthalmic solution   cephALEXin  (KEFLEX ) 250 MG capsule   cephALEXin  (KEFLEX ) 500 MG capsule   Cyanocobalamin (B-12) 5000 MCG CAPS   dorzolamide -timolol  (COSOPT ) 2-0.5 % ophthalmic solution   GINSENG PO   levofloxacin (LEVAQUIN) 750 MG tablet   lidocaine  (LIDODERM ) 5 %   metFORMIN  (GLUCOPHAGE -XR) 500 MG 24 hr tablet   methocarbamol  (ROBAXIN ) 500 MG tablet   metoprolol  succinate (TOPROL -XL) 25 MG 24 hr tablet   Omega-3 Fatty Acids (FISH OIL) 1000 MG CAPS   OVER THE COUNTER MEDICATION   oxyCODONE  (OXY IR/ROXICODONE ) 5 MG immediate release tablet   prednisoLONE  acetate (PRED FORTE )  1 % ophthalmic suspension   sodium chloride  (MURO 128) 5 % ophthalmic solution   timolol  (TIMOPTIC ) 0.5 % ophthalmic solution   vitamin E 180 MG (400 UNITS) capsule   No current facility-administered medications for this encounter.    Burnard CHRISTELLA Odis DEVONNA [facility] Surgical Short Stay/Anesthesiology [facility] Phone [phone] 02/24/2024 12:45 PM

## 2024-02-24 NOTE — Anesthesia Preprocedure Evaluation (Signed)
 Anesthesia Evaluation  Patient identified by MRN, date of birth, ID band Patient awake    Reviewed: Allergy & Precautions, H&P , NPO status , Patient's Chart, lab work & pertinent test results  History of Anesthesia Complications Negative for: history of anesthetic complications  Airway Mallampati: II  TM Distance: >3 FB Neck ROM: Full    Dental no notable dental hx. (+) Partial Lower   Pulmonary former smoker   Pulmonary exam normal breath sounds clear to auscultation       Cardiovascular hypertension, + Peripheral Vascular Disease  Normal cardiovascular exam Rhythm:Regular Rate:Normal     Neuro/Psych neg Seizures Cervical myelopathy    negative psych ROS   GI/Hepatic negative GI ROS, Neg liver ROS,,,  Endo/Other  diabetes, Type 2    Renal/GU negative Renal ROS   BPH    Musculoskeletal negative musculoskeletal ROS (+)    Abdominal   Peds negative pediatric ROS (+)  Hematology negative hematology ROS (+)   Anesthesia Other Findings   Reproductive/Obstetrics negative OB ROS                              Anesthesia Physical Anesthesia Plan  ASA: 3  Anesthesia Plan: General   Post-op Pain Management: Tylenol  PO (pre-op)*   Induction: Intravenous  PONV Risk Score and Plan: 2 and Ondansetron , Dexamethasone  and Treatment may vary due to age or medical condition  Airway Management Planned: Oral ETT  Additional Equipment: None  Intra-op Plan:   Post-operative Plan: Extubation in OR  Informed Consent: I have reviewed the patients History and Physical, chart, labs and discussed the procedure including the risks, benefits and alternatives for the proposed anesthesia with the patient or authorized representative who has indicated his/her understanding and acceptance.     Dental advisory given  Plan Discussed with: CRNA  Anesthesia Plan Comments: (See PAT note from 8/12  :  Brian Daniels is a 68 yo male with PMH of HTN, DM, s/p ACDF C3-7 (2021), s/p lumbar fusion L2-4 (2024). Pt is legally blind.   Pt with hx of heart murmur. No murmur heard on exam by provider on 10/21/22. Prior echo in 2024 done at [facility] medical was normal. )         Anesthesia Quick Evaluation

## 2024-03-02 NOTE — H&P (Signed)
 CC/HPI: cc: Prostate nodule, LUTs   12/12/22: 68 year old man referred for a prostate nodule. We have no PSA on record or an outside records. Patient was unable to leave a urine sample today. He was recently diagnosed with a UTI and incomplete emptying at the hospital. His PVR today 73 cc. He has a family history of prostate cancer. His brother died at age 14 from prostate cancer. Patient takes super beta prostate for urinary symptoms sporadically. He has tried tamsulosin  but it interferes with his vision.   IPSS: 2,1,2,2,2,0,5=14/35  Mostly dissatisfied 4/6   01/21/2023: 68 year old man with a prostate nodule and BPH with LUTS here for prostate biopsy. Urinalysis today appears consistent with infection. Patient is asymptomatic but was treated for UTI in April.   01/28/2023: 68 year old man with a prostate nodule here for prostate biopsy. Patient was scheduled for last week however his urine looked infected and a culture was sent. Culture ultimately returned negative.   02/04/2023: 68 year old man with BPH with LUTS as well as a prostate nodule here for prostate biopsy results. TRUS prostate size is 50 g. Patient is unable to tolerate tamsulosin  due to vision changes. He has been treated for a UTI in the last several months.   05/13/2023: 68 year old man with history of BPH with LUTS as well as prostate nodule s/p negative biopsy in July 2024 here for follow-up. Prostate size 50 g. He was unable to tolerate tamsulosin  due to vision changes. He was prescribed finasteride at his last visit and again said he noticed changes in his vision.   11/27/2023: 68 year old man with a history of BPH with LUTS as well as prostate biopsy s/p negative biopsy July 2024 here for acute onset left testicular pain and swelling. He was seen by his PCP 2 days ago and started on levofloxacin. He does have incomplete bladder emptying and a PVR of 132 cc. His urinalysis is consistent with infection. He is unable to tolerate alpha  blockers or 5 alpha reductase inhibitors due to vision changes.   01/12/24: 68 year old male with a history of BPH with LUTS as well as prostate biopsy s/p negative biopsy July 2024. He has incomplete emptying and chronic cystitis. He cannot tolerate tamsulosin  due to vision changes.     ALLERGIES: No Known Allergies    MEDICATIONS: metFORMIN  HCl 500 MG Tablet  amLODIPine  Besylate 5 MG Tablet  Atorvastatin  Calcium  20 MG Tablet  Benazepril  HCl 10 MG Tablet  Brimonidine  Tartrate 0.2 % Solution  Dorzolamide  HCl-Timolol  Mal 2-0.5 % Solution  levoFLOXacin 500 MG Tablet  Multiple Vitamins  prednisoLONE  Acetate 1 % Suspension  Timolol  Maleate 0.5 % Solution     GU PSH: Prostate Needle Biopsy - 01/28/2023       PSH Notes: Cervical (spinal) (2021), lumbar (spinal) (2024)   NON-GU PSH: Surgical Pathology, Gross And Microscopic Examination For Prostate Needle - 01/28/2023 Visit Complexity (formerly [facility]) - 12/04/2023, 11/27/2023, 05/13/2023         GU PMH: BPH w/LUTS - 12/04/2023, - 11/27/2023, - 05/13/2023, - 02/04/2023, - 01/21/2023, - 12/12/2022 Epididymo-orchitis - 12/04/2023, - 11/27/2023 Incomplete bladder emptying - 12/04/2023, - 11/27/2023, - 05/13/2023, - 02/04/2023, - 01/21/2023, - 12/12/2022 Nocturia - 11/27/2023, - 05/13/2023, - 01/21/2023, - 12/12/2022 Prostate nodule w/ LUTS - 02/04/2023, - 01/28/2023, - 01/21/2023, - 12/12/2022      PMH Notes: Neuropathy   NON-GU PMH: Bacteriuria - 05/13/2023, - 02/04/2023, - 01/21/2023 Arthritis Coronary Artery Disease Diabetes Type 2 Glaucoma Gout Hypercholesterolemia Hypertension    FAMILY HISTORY: Diabetes -  Brother, Sister, Mother Prostate Cancer - Runs in Family stroke - Mother   SOCIAL HISTORY: Marital Status: Divorced Preferred Language: English; Ethnicity: Not Hispanic Or Latino; Race: Black or African American Current Smoking Status: Patient smokes occasionally. Smokes less than 1/2 pack per day.  <DIV'  Tobacco Use Assessment Completed:   Used Tobacco in last 30 days? Has never drank.  Does not drink caffeine. Patient's occupation is/was Retired. </DIV'   REVIEW OF SYSTEMS:     GU Review Male:  Patient denies frequent urination, hard to postpone urination, burning/ pain with urination, get up at night to urinate, leakage of urine, stream starts and stops, trouble starting your stream, have to strain to urinate , erection problems, and penile pain.    Gastrointestinal (Upper):  Patient denies nausea, vomiting, and indigestion/ heartburn.    Gastrointestinal (Lower):  Patient denies diarrhea and constipation.    Constitutional:  Patient denies fever, night sweats, weight loss, and fatigue.    Skin:  Patient denies skin rash/ lesion and itching.    Eyes:  Patient denies blurred vision and double vision.    Ears/ Nose/ Throat:  Patient denies sore throat and sinus problems.    Hematologic/Lymphatic:  Patient denies swollen glands and easy bruising.    Cardiovascular:  Patient denies leg swelling and chest pains.    Respiratory:  Patient denies cough and shortness of breath.    Endocrine:  Patient denies excessive thirst.    Musculoskeletal:  Patient denies back pain and joint pain.    Neurological:  Patient denies dizziness and headaches.    Psychologic:  Patient denies depression and anxiety.    VITAL SIGNS: None     MULTI-SYSTEM PHYSICAL EXAMINATION:      Constitutional: Well-nourished. No physical deformities. Normally developed. Good grooming.     Neck: Neck symmetrical, not swollen. Normal tracheal position.     Respiratory: No labored breathing, no use of accessory muscles.      Skin: No paleness, no jaundice, no cyanosis. No lesion, no ulcer, no rash.     Neurologic / Psychiatric: Oriented to time, oriented to place, oriented to person. No depression, no anxiety, no agitation.     Eyes: Normal conjunctivae. Normal eyelids.     Ears, Nose, Mouth, and Throat: Left ear no scars, no lesions, no masses. Right ear no scars,  no lesions, no masses. Nose no scars, no lesions, no masses. Normal hearing. Normal lips.     Musculoskeletal: Normal gait and station of head and neck.            Complexity of Data:   Records Review:  Previous Patient Records, POC Tool  Urine Test Review:  Urinalysis    12/12/22  PSA  Total PSA 1.69 ng/mL    PROCEDURES:    Flexible Cystoscopy - 52000  Risks, benefits, and some of the potential complications of the procedure were discussed at length with the patient including infection, bleeding, voiding discomfort, urinary retention, fever, chills, sepsis, and others. All questions were answered. Informed consent was obtained. Sterile technique and intraurethral analgesia were used.  Meatus:  Normal size. Normal location. Normal condition.  Urethra:  No strictures.  External Sphincter:  Normal.  Verumontanum:  Normal.  Prostate:  Obstructing. Enlarged median lobe.   Bladder Neck:  Non-obstructing.  Ureteral Orifices:  Normal location. Normal size. Normal shape. Effluxed clear urine.  Bladder:  No trabeculation. No tumors. Normal mucosa. No stones. Evidence of chronic infection      The lower urinary  tract was carefully examined. The procedure was well-tolerated and without complications. Antibiotic instructions were given. Instructions were given to call the office immediately for bloody urine, difficulty urinating, urinary retention, painful or frequent urination, fever, chills, nausea, vomiting or other illness. The patient stated that he understood these instructions and would comply with them.       Flow Rate - 51741  Total Void Time: 0:16 min:sec  Voided Volume: 149 cc  Flow Time: 0:16 min:sec  Peak Flow Rate: 18 cc/sec  Average Flow Rate: 9 cc/sec  Time of Peak Flow: 0:03 min:sec       PVR Ultrasound - 48201  Scanned Volume: 66 cc    Urinalysis w/Scope  Dipstick Dipstick Cont'd Micro  Color: Yellow Bilirubin: Neg mg/dL WBC/hpf: 20 - 59/yeq  Appearance:  Slightly Cloudy Ketones: Neg mg/dL RBC/hpf: 0 - 2/hpf  Specific Gravity: 1.015 Blood: Neg ery/uL Bacteria: Many (>50/hpf)  pH: 6.5 Protein: Trace mg/dL Cystals: Ca Oxalate  Glucose: Neg mg/dL Urobilinogen: 0.2 mg/dL Casts: NS (Not Seen)   Nitrites: Neg Trichomonas: Not Present   Leukocyte Esterase: 2+ leu/uL Mucous: Not Present    Epithelial Cells: 0 - 5/hpf    Yeast: NS (Not Seen)    Sperm: Not Present    ASSESSMENT:     ICD-10 Details  1 GU:  BPH w/LUTS - N40.1 Chronic, Stable  2  Incomplete bladder emptying - R39.14 Chronic, Stable  3  Nocturia - R35.1 Chronic, Stable  4 NON-GU:  Bacteriuria - R82.71 Chronic, Stable   PLAN:   Medications  New Meds: Macrobid 100 MG Capsule 1 capsule PO BID #20 0 Refill(s)  Cephalexin  250 MG Capsule 1 capsule PO Daily #90 1 Refill(s)     Pharmacy Name:  [id number]    Address:  [address]     [place], [place] [id number]    Phone:  [phone]    Fax:  [phone]   Orders  Labs CULTURE, URINE  Document  Letter(s):  Created for Patient: Clinical Summary   Notes:  1. BPH with LUTS:  - Patient with intravesical median lobe and 50 g prostate by ultrasound  - He has been experiencing incomplete emptying with chronic cystitis  - We discussed risks and benefits of TURP including but not limited to pain, bleeding, infection, damage to surrounding structures, inability to improve symptoms, worsening urinary urgency, need for additional treatment  - Treat empirically with Macrobid and then put on suppressive therapy with cephalexin 

## 2024-03-03 ENCOUNTER — Other Ambulatory Visit: Payer: Self-pay

## 2024-03-03 ENCOUNTER — Encounter (HOSPITAL_COMMUNITY): Payer: Self-pay | Admitting: Medical

## 2024-03-03 ENCOUNTER — Ambulatory Visit (HOSPITAL_COMMUNITY)
Admission: RE | Admit: 2024-03-03 | Discharge: 2024-03-03 | Disposition: A | Discharge status: Home / Self Care | Attending: Urology | Admitting: Urology

## 2024-03-03 ENCOUNTER — Ambulatory Visit (HOSPITAL_BASED_OUTPATIENT_CLINIC_OR_DEPARTMENT_OTHER): Payer: Self-pay

## 2024-03-03 ENCOUNTER — Encounter (HOSPITAL_COMMUNITY): Payer: Self-pay | Admitting: Urology

## 2024-03-03 ENCOUNTER — Encounter (HOSPITAL_COMMUNITY)
Admission: RE | Disposition: A | Discharge status: Home / Self Care | Payer: Self-pay | Source: Home / Self Care | Attending: Urology

## 2024-03-03 DIAGNOSIS — Z7984 Long term (current) use of oral hypoglycemic drugs: Secondary | ICD-10-CM | POA: Diagnosis not present

## 2024-03-03 DIAGNOSIS — Z87891 Personal history of nicotine dependence: Secondary | ICD-10-CM | POA: Insufficient documentation

## 2024-03-03 DIAGNOSIS — N329 Bladder disorder, unspecified: Secondary | ICD-10-CM | POA: Diagnosis not present

## 2024-03-03 DIAGNOSIS — E119 Type 2 diabetes mellitus without complications: Secondary | ICD-10-CM | POA: Diagnosis not present

## 2024-03-03 DIAGNOSIS — N320 Bladder-neck obstruction: Secondary | ICD-10-CM | POA: Diagnosis not present

## 2024-03-03 DIAGNOSIS — N401 Enlarged prostate with lower urinary tract symptoms: Secondary | ICD-10-CM | POA: Diagnosis present

## 2024-03-03 DIAGNOSIS — R338 Other retention of urine: Secondary | ICD-10-CM | POA: Diagnosis not present

## 2024-03-03 DIAGNOSIS — N403 Nodular prostate with lower urinary tract symptoms: Secondary | ICD-10-CM | POA: Diagnosis not present

## 2024-03-03 DIAGNOSIS — I10 Essential (primary) hypertension: Secondary | ICD-10-CM | POA: Diagnosis not present

## 2024-03-03 DIAGNOSIS — N3020 Other chronic cystitis without hematuria: Secondary | ICD-10-CM | POA: Insufficient documentation

## 2024-03-03 DIAGNOSIS — R339 Retention of urine, unspecified: Secondary | ICD-10-CM

## 2024-03-03 DIAGNOSIS — Z01818 Encounter for other preprocedural examination: Secondary | ICD-10-CM

## 2024-03-03 HISTORY — PX: TRANSURETHRAL RESECTION OF PROSTATE: SHX73

## 2024-03-03 LAB — GLUCOSE, CAPILLARY
Glucose-Capillary: 95 mg/dL (ref 70–99)
Glucose-Capillary: 89 mg/dL (ref 70–99)

## 2024-03-03 SURGERY — TURP (TRANSURETHRAL RESECTION OF PROSTATE)
Anesthesia: General | Site: Prostate

## 2024-03-03 MED ORDER — CEFAZOLIN SODIUM-DEXTROSE 2-4 GM/100ML-% IV SOLN
2.0000 g | Freq: Once | INTRAVENOUS | Status: AC
  Administered 2024-03-03: 2 g via INTRAVENOUS
  Filled 2024-03-03: qty 100

## 2024-03-03 MED ORDER — ACETAMINOPHEN 500 MG PO TABS
1000.0000 mg | ORAL_TABLET | Freq: Once | ORAL | Status: AC
  Administered 2024-03-03: 1000 mg via ORAL
  Filled 2024-03-03: qty 2

## 2024-03-03 MED ORDER — FENTANYL CITRATE (PF) 100 MCG/2ML IJ SOLN
INTRAMUSCULAR | Status: AC
  Filled 2024-03-03: qty 2

## 2024-03-03 MED ORDER — 0.9 % SODIUM CHLORIDE (POUR BTL) OPTIME
TOPICAL | Status: DC | PRN
  Administered 2024-03-03: 1000 mL

## 2024-03-03 MED ORDER — FENTANYL CITRATE PF 50 MCG/ML IJ SOSY
PREFILLED_SYRINGE | INTRAMUSCULAR | Status: AC
Start: 2024-03-03 — End: 2024-03-03
  Filled 2024-03-03: qty 1

## 2024-03-03 MED ORDER — SUGAMMADEX SODIUM 200 MG/2ML IV SOLN
INTRAVENOUS | Status: DC | PRN
  Administered 2024-03-03: 400 mg via INTRAVENOUS

## 2024-03-03 MED ORDER — CHLORHEXIDINE GLUCONATE 0.12 % MT SOLN
15.0000 mL | Freq: Once | OROMUCOSAL | Status: AC
  Administered 2024-03-03: 15 mL via OROMUCOSAL

## 2024-03-03 MED ORDER — OXYCODONE HCL 5 MG/5ML PO SOLN: 5.0000 mg | Freq: Once | ORAL | Status: DC | PRN

## 2024-03-03 MED ORDER — EPHEDRINE 5 MG/ML INJ
INTRAVENOUS | Status: AC
  Filled 2024-03-03: qty 5

## 2024-03-03 MED ORDER — SUGAMMADEX SODIUM 200 MG/2ML IV SOLN
INTRAVENOUS | Status: AC
Start: 2024-03-03 — End: 2024-03-03
  Filled 2024-03-03: qty 2

## 2024-03-03 MED ORDER — ORAL CARE MOUTH RINSE: 15.0000 mL | Freq: Once | OROMUCOSAL | Status: AC

## 2024-03-03 MED ORDER — LIDOCAINE HCL (PF) 2 % IJ SOLN
INTRAMUSCULAR | Status: AC
  Filled 2024-03-03: qty 5

## 2024-03-03 MED ORDER — OXYCODONE HCL 5 MG PO TABS: 5.0000 mg | ORAL_TABLET | Freq: Once | ORAL | Status: DC | PRN

## 2024-03-03 MED ORDER — CEPHALEXIN 250 MG PO CAPS: 250.0000 mg | ORAL_CAPSULE | Freq: Every day | ORAL | 0 refills | Status: AC

## 2024-03-03 MED ORDER — DEXAMETHASONE SODIUM PHOSPHATE 10 MG/ML IJ SOLN
INTRAMUSCULAR | Status: AC
  Filled 2024-03-03: qty 1

## 2024-03-03 MED ORDER — FENTANYL CITRATE PF 50 MCG/ML IJ SOSY
25.0000 ug | PREFILLED_SYRINGE | INTRAMUSCULAR | Status: DC | PRN
  Administered 2024-03-03 (×2): 25 ug via INTRAVENOUS

## 2024-03-03 MED ORDER — PROPOFOL 10 MG/ML IV BOLUS
INTRAVENOUS | Status: DC | PRN
  Administered 2024-03-03: 150 mg via INTRAVENOUS

## 2024-03-03 MED ORDER — SODIUM CHLORIDE 0.9 % IR SOLN
Status: DC | PRN
  Administered 2024-03-03: 12000 mL via INTRAVESICAL

## 2024-03-03 MED ORDER — EPHEDRINE SULFATE-NACL 50-0.9 MG/10ML-% IV SOSY
PREFILLED_SYRINGE | INTRAVENOUS | Status: DC | PRN
Start: 2024-03-03 — End: 2024-03-03
  Administered 2024-03-03 (×5): 5 mg via INTRAVENOUS

## 2024-03-03 MED ORDER — ROCURONIUM BROMIDE 10 MG/ML (PF) SYRINGE
PREFILLED_SYRINGE | INTRAVENOUS | Status: AC
  Filled 2024-03-03: qty 10

## 2024-03-03 MED ORDER — FENTANYL CITRATE (PF) 100 MCG/2ML IJ SOLN
INTRAMUSCULAR | Status: DC | PRN
  Administered 2024-03-03 (×2): 50 ug via INTRAVENOUS

## 2024-03-03 MED ORDER — ACETAMINOPHEN 10 MG/ML IV SOLN
1000.0000 mg | Freq: Once | INTRAVENOUS | Status: DC | PRN
Start: 2024-03-03 — End: 2024-03-03

## 2024-03-03 MED ORDER — ONDANSETRON HCL 4 MG/2ML IJ SOLN
INTRAMUSCULAR | Status: DC | PRN
  Administered 2024-03-03: 4 mg via INTRAVENOUS

## 2024-03-03 MED ORDER — DEXAMETHASONE SODIUM PHOSPHATE 10 MG/ML IJ SOLN
INTRAMUSCULAR | Status: DC | PRN
  Administered 2024-03-03: 5 mg via INTRAVENOUS

## 2024-03-03 MED ORDER — OXYCODONE HCL 5 MG PO TABS: 5.0000 mg | ORAL_TABLET | Freq: Three times a day (TID) | ORAL | 0 refills | Status: AC | PRN

## 2024-03-03 MED ORDER — ONDANSETRON HCL 4 MG/2ML IJ SOLN
INTRAMUSCULAR | Status: AC
  Filled 2024-03-03: qty 2

## 2024-03-03 MED ORDER — ROCURONIUM BROMIDE 100 MG/10ML IV SOLN
INTRAVENOUS | Status: DC | PRN
  Administered 2024-03-03: 60 mg via INTRAVENOUS

## 2024-03-03 MED ORDER — INSULIN ASPART 100 UNIT/ML IJ SOLN: 0.0000 [IU] | INTRAMUSCULAR | Status: DC | PRN

## 2024-03-03 MED ORDER — PROPOFOL 10 MG/ML IV BOLUS
INTRAVENOUS | Status: AC
  Filled 2024-03-03: qty 20

## 2024-03-03 MED ORDER — LIDOCAINE HCL (PF) 2 % IJ SOLN
INTRAMUSCULAR | Status: DC | PRN
  Administered 2024-03-03: 100 mg via INTRADERMAL

## 2024-03-03 MED ORDER — LACTATED RINGERS IV SOLN: INTRAVENOUS | Status: DC

## 2024-03-03 MED ORDER — DROPERIDOL 2.5 MG/ML IJ SOLN: 0.6250 mg | Freq: Once | INTRAMUSCULAR | Status: DC | PRN

## 2024-03-03 SURGICAL SUPPLY — 18 items
BAG URINE DRAIN 2000ML AR STRL (UROLOGICAL SUPPLIES) IMPLANT
BAG URO CATCHER STRL LF (MISCELLANEOUS) ×1 IMPLANT
CATH FOLEY 3WAY 30CC 22FR (CATHETERS) IMPLANT
CATH HEMA 3WAY 30CC 22FR COUDE (CATHETERS) IMPLANT
DRAPE FOOT SWITCH (DRAPES) ×1 IMPLANT
GLOVE BIO SURGEON STRL SZ 6.5 (GLOVE) ×1 IMPLANT
GOWN STRL REUS W/ TWL LRG LVL3 (GOWN DISPOSABLE) ×1 IMPLANT
HOLDER FOLEY CATH W/STRAP (MISCELLANEOUS) ×1 IMPLANT
KIT TURNOVER KIT A (KITS) ×1 IMPLANT
LOOP CUT BIPOLAR 24F LRG (ELECTROSURGICAL) ×1 IMPLANT
MANIFOLD NEPTUNE II (INSTRUMENTS) ×1 IMPLANT
PACK CYSTO (CUSTOM PROCEDURE TRAY) ×1 IMPLANT
PAD PREP 24X48 CUFFED NSTRL (MISCELLANEOUS) ×1 IMPLANT
PLUG CATH AND CAP STRL 200 (CATHETERS) ×1 IMPLANT
SYR 30ML LL (SYRINGE) IMPLANT
SYRINGE TOOMEY IRRIG 70ML (MISCELLANEOUS) ×1 IMPLANT
TUBING CONNECTING 10 (TUBING) ×1 IMPLANT
TUBING UROLOGY SET (TUBING) ×1 IMPLANT

## 2024-03-03 NOTE — Discharge Instructions (Signed)

## 2024-03-03 NOTE — Anesthesia Procedure Notes (Signed)
 Procedure Name: Intubation Date/Time: 03/03/2024 8:06 AM  Performed by: Augusta Daved SAILOR, CRNAPre-anesthesia Checklist: Patient identified, Emergency Drugs available, Suction available and Patient being monitored Patient Re-evaluated:Patient Re-evaluated prior to induction Oxygen Delivery Method: Circle System Utilized Preoxygenation: Pre-oxygenation with 100% oxygen Induction Type: IV induction Ventilation: Mask ventilation without difficulty Laryngoscope Size: 2 and Miller Grade View: Grade II Tube type: Oral Number of attempts: 1 Airway Equipment and Method: Stylet and Oral airway Placement Confirmation: ETT inserted through vocal cords under direct vision, positive ETCO2 and breath sounds checked- equal and bilateral Secured at: 22 (at the lip) cm Tube secured with: Tape Dental Injury: Teeth and Oropharynx as per pre-operative assessment

## 2024-03-03 NOTE — Interval H&P Note (Signed)
 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  The various methods of treatment have been discussed with the patient and family. After consideration of risks, benefits and other options for treatment, the patient has consented to  Procedure(s) with comments: TURP (TRANSURETHRAL RESECTION OF PROSTATE) (N/A) - TRANSURETHRAL RESECTION OF THE PROSTATE as a surgical intervention.  The patient's history has been reviewed, patient examined, no change in status, stable for surgery.  I have reviewed the patient's chart and labs.  Questions were answered to the patient's satisfaction.     [name] D Maebel Marasco

## 2024-03-03 NOTE — Anesthesia Postprocedure Evaluation (Signed)
 Anesthesia Post Note  Patient: Brian Daniels  Procedure(s) Performed: TURP (TRANSURETHRAL RESECTION OF PROSTATE) (Prostate)     Patient location during evaluation: PACU Anesthesia Type: General Level of consciousness: awake and alert Pain management: pain level controlled Vital Signs Assessment: post-procedure vital signs reviewed and stable Respiratory status: spontaneous breathing, nonlabored ventilation, respiratory function stable and patient connected to nasal cannula oxygen Cardiovascular status: blood pressure returned to baseline and stable Postop Assessment: no apparent nausea or vomiting Anesthetic complications: no   No notable events documented.  Last Vitals:  Vitals:   03/03/24 1130 03/03/24 1145  BP: (!) 184/94 (!) 172/87  Pulse: (!) 55 (!) 55  Resp: 18   Temp:    SpO2: 100% 100%    Last Pain:  Vitals:   03/03/24 1130  TempSrc:   PainSc: 0-No pain                 Thom JONELLE Peoples

## 2024-03-03 NOTE — Op Note (Signed)
 Preoperative diagnosis:  Bladder outlet obstruction Urinary retention  Postoperative diagnosis:  same   Procedure:  Transurethral resection of prostate  Surgeon: Valli Shank, MD   Anesthesia: General   Complications: None   Intraoperative findings:  Normal anterior urethra Bilateral lateral lobe obstruction with intravesical median lobe normal appearing bladder mucosa without tumor/stones/or abnormalities.   Orthotopic Uos see at start and end of the case  EBL: 50cc   Specimens: Prostate chips  Indication: Brian Daniels is a 68 y.o. patient with bladder outlet obstruction and obstructive voiding symptoms.  After reviewing the management options for treatment, he elected to proceed with the above surgical procedure(s). We have discussed the potential benefits and risks of the procedure, side effects of the proposed treatment, the likelihood of the patient achieving the goals of the procedure, and any potential problems that might occur during the procedure or recuperation. Informed consent has been obtained.  Description of procedure:  The patient was taken to the operating room and general anesthesia was induced. The patient was placed in the dorsal lithotomy position, prepped and draped in the usual sterile fashion, and preoperative antibiotics were administered. A preoperative time-out was performed.   I then gently passed the 21 French 30 cystoscope into the patient's urethra and up into the bladder under visual guidance. A 360 cystoscopic evaluation was then performed with the above findings.  I then removed the 21 French cystoscopic sheath and replacement with a 26 French resectoscope sheath was passed and using the visual obturator under direct vision. An exchange the obturator for the resectoscope itself and the loop cautery. I then proceeded to create grooves within the prostate at the 7:00 position extending the defect from the bladder neck at 7:00 down to the prostate  apex just lateral to the verumontanum. I took this groove down to the capsule. I then performed a similar maneuver on the patient's left side at the 5:00 position taking the prostate down from the bladder neck to the apex and down the prostatic capsule. At this point I proceeded to resect the patient's right lateral lobe in a systematic fashion moving from the 7:00 position up to approximately 11. The resection was taken down to the prostate capsule. Hemostasis was achieved on this side prior to moving to the patient's left lateral lobe. A similar sequence was performed taking the prostate down from the 5:00 position to approximately the 1:00 position to the level of the prostatic capsule. I then completed the resection of the posterior wall as well as the area between 5:00 and 7:00 along the bladder neck. I was careful not to undermine the bladder neck or resect the inferior. I then did resect the area that was protruding into the prostatic urethra anteriorly.   Once I was satisfied that the prostate had been adequately resected I evacuated the prostate chips using a Toomey syringe. I then reintroduced the resectoscope and ensured adequate hemostasis. I then left the bladder full and removed the resectoscope entirely. Exam under anesthesia demonstrated a normal sized prostate with no nodules.  I then placed a 22 French three-way Foley catheter. I irrigated the catheter gently and removed all debris and clots.   He subsequently extubated and returned to PACU in excellent condition.  Valli Shank, MD

## 2024-03-03 NOTE — Transfer of Care (Signed)
 Immediate Anesthesia Transfer of Care Note  Patient: Brian Daniels  Procedure(s) Performed: TURP (TRANSURETHRAL RESECTION OF PROSTATE) (Prostate)  Patient Location: PACU  Anesthesia Type:General  Level of Consciousness: awake, alert , oriented, and patient cooperative  Airway & Oxygen Therapy: Patient Spontanous Breathing and Patient connected to face mask oxygen  Post-op Assessment: Report given to RN and Post -op Vital signs reviewed and stable  Post vital signs: Reviewed and stable  Last Vitals:  Vitals Value Taken Time  BP 170/97 03/03/24 09:02  Temp    Pulse 67 03/03/24 08:57  Resp 10 03/03/24 08:57  SpO2 100 % 03/03/24 08:57  Vitals shown include unfiled device data.  Last Pain:  Vitals:   03/03/24 0635  TempSrc:   PainSc: 0-No pain         Complications: No notable events documented.

## 2024-03-04 ENCOUNTER — Encounter (HOSPITAL_COMMUNITY): Payer: Self-pay | Admitting: Urology

## 2024-03-04 ENCOUNTER — Ambulatory Visit: Payer: Self-pay

## 2024-03-04 LAB — SURGICAL PATHOLOGY

## 2024-03-04 NOTE — Telephone Encounter (Signed)
 FYI Only or Action Required?: FYI only for provider.  Patient was last seen in primary care on n/a.  Called Nurse Triage reporting Medication Problem.  Triage Disposition: Call Pharmacist Within 24 Hours  Patient/caregiver understands and will follow disposition?: Yes      Copied from CRM #[id number]. Topic: Appointments - Appointment Scheduling >> Mar 04, 2024  2:40 PM Nathanel DEL wrote: Patient/patient representative is calling to schedule an appointment. Refer to attachments for appointment information. Reason for Disposition  [1] Caller has medicine question about med NOT prescribed by primary care doctor (or NP/PA) or specialist AND [2] triager unable to answer question (e.g., compatibility with other med, storage)  Answer Assessment - Initial Assessment Questions 1. NAME of MEDICINE: What medicine(s) are you calling about?     cephALEXin  (KEFLEX ) 250 MG capsule 2. QUESTION: What is your question? (e.g., double dose of medicine, side effect)     sister reports that Rx is not at pharmacy - sister reports that pt had a TURP procedure yesterday and is unsure about abx that was prescribed. Needs clarification on if Keflex  should be 250 mg or 500 mg. Triager advised that per EMR, Rx is written for cephALEXin  (KEFLEX ) 250 MG capsule. Caregiver verbalized understanding and strongly advised to call specialist office for additional clarification.   3. PRESCRIBER: Who prescribed the medicine? Reason: if prescribed by specialist, call should be referred to that group.     Elisabeth Valli BIRCH, MD 4. SYMPTOMS: Do you have any symptoms? If Yes, ask: What symptoms are you having?  How bad are the symptoms (e.g., mild, moderate, severe)     N/a 5. PREGNANCY:  Is there any chance that you are pregnant? When was your last menstrual period?     N/a  Protocols used: Medication Question Call-A-AH
